# Patient Record
Sex: Male | Born: 1950
Health system: Southern US, Community
[De-identification: ages and names within clinical notes are randomized; demographics above are authoritative.]

## PROBLEM LIST (undated history)

## (undated) DIAGNOSIS — E119 Type 2 diabetes mellitus without complications: Secondary | ICD-10-CM

## (undated) DIAGNOSIS — R011 Cardiac murmur, unspecified: Secondary | ICD-10-CM

## (undated) DIAGNOSIS — K219 Gastro-esophageal reflux disease without esophagitis: Secondary | ICD-10-CM

## (undated) DIAGNOSIS — E785 Hyperlipidemia, unspecified: Secondary | ICD-10-CM

## (undated) DIAGNOSIS — M1990 Unspecified osteoarthritis, unspecified site: Secondary | ICD-10-CM

## (undated) DIAGNOSIS — I10 Essential (primary) hypertension: Secondary | ICD-10-CM

## (undated) DIAGNOSIS — R0600 Dyspnea, unspecified: Secondary | ICD-10-CM

## (undated) DIAGNOSIS — L409 Psoriasis, unspecified: Secondary | ICD-10-CM

## (undated) DIAGNOSIS — Z87442 Personal history of urinary calculi: Secondary | ICD-10-CM

## (undated) DIAGNOSIS — J189 Pneumonia, unspecified organism: Secondary | ICD-10-CM

## (undated) DIAGNOSIS — Z8639 Personal history of other endocrine, nutritional and metabolic disease: Secondary | ICD-10-CM

## (undated) DIAGNOSIS — Z9289 Personal history of other medical treatment: Secondary | ICD-10-CM

## (undated) HISTORY — PX: WRIST SURGERY: SHX841

## (undated) HISTORY — PX: DENTAL SURGERY: SHX609

## (undated) HISTORY — PX: COLONOSCOPY: SHX174

---

## 1993-12-01 HISTORY — PX: BACK SURGERY: SHX140

## 2000-10-12 ENCOUNTER — Encounter: Payer: Self-pay | Admitting: Neurosurgery

## 2000-10-12 ENCOUNTER — Ambulatory Visit (HOSPITAL_COMMUNITY): Admission: RE | Admit: 2000-10-12 | Discharge: 2000-10-12 | Payer: Self-pay | Admitting: Neurosurgery

## 2000-10-26 ENCOUNTER — Ambulatory Visit (HOSPITAL_COMMUNITY): Admission: RE | Admit: 2000-10-26 | Discharge: 2000-10-26 | Payer: Self-pay | Admitting: Neurosurgery

## 2000-10-26 ENCOUNTER — Encounter: Payer: Self-pay | Admitting: Neurosurgery

## 2000-11-09 ENCOUNTER — Encounter: Payer: Self-pay | Admitting: Neurosurgery

## 2000-11-09 ENCOUNTER — Ambulatory Visit (HOSPITAL_COMMUNITY): Admission: RE | Admit: 2000-11-09 | Discharge: 2000-11-09 | Payer: Self-pay | Admitting: Neurosurgery

## 2001-02-03 ENCOUNTER — Encounter: Payer: Self-pay | Admitting: Neurosurgery

## 2001-02-04 ENCOUNTER — Ambulatory Visit (HOSPITAL_COMMUNITY): Admission: RE | Admit: 2001-02-04 | Discharge: 2001-02-05 | Payer: Self-pay | Admitting: Neurosurgery

## 2001-02-04 ENCOUNTER — Encounter: Payer: Self-pay | Admitting: Neurosurgery

## 2002-09-02 ENCOUNTER — Emergency Department (HOSPITAL_COMMUNITY): Admission: EM | Admit: 2002-09-02 | Discharge: 2002-09-02 | Payer: Self-pay | Admitting: *Deleted

## 2002-09-02 ENCOUNTER — Encounter: Payer: Self-pay | Admitting: *Deleted

## 2005-07-28 ENCOUNTER — Ambulatory Visit: Payer: Self-pay | Admitting: Family Medicine

## 2005-10-15 ENCOUNTER — Ambulatory Visit: Payer: Self-pay | Admitting: Family Medicine

## 2006-02-18 ENCOUNTER — Ambulatory Visit: Payer: Self-pay | Admitting: Family Medicine

## 2006-05-18 ENCOUNTER — Ambulatory Visit: Payer: Self-pay | Admitting: Family Medicine

## 2007-01-06 ENCOUNTER — Ambulatory Visit: Payer: Self-pay | Admitting: Family Medicine

## 2011-10-06 ENCOUNTER — Encounter (HOSPITAL_COMMUNITY): Payer: Self-pay | Admitting: Pharmacy Technician

## 2011-10-06 ENCOUNTER — Encounter (HOSPITAL_COMMUNITY): Payer: Self-pay | Admitting: *Deleted

## 2011-10-06 NOTE — Pre-Procedure Instructions (Signed)
Instructed NPO after MN except may have clear liquids until 0530- then none.  STATE LEARNS BEST BUY  READING_  STATED HEIGHT 6'0 and WEIGHT 217 lbs. Instructed to use Betasept or Hibiclins shower tonight and in the AM, but regular soap on face and privates

## 2011-10-06 NOTE — H&P (Signed)
Keith Oconnell DOB: Sep 30, 1951 Married / Language: English / Race: White Male   History of Present Illness The patient is a 60 year old male who presents with shoulder complaints. They are right handed and present today reporting pain, pain with overhead motions, pain with throwing, pain with reaching and pain with lifting at the right shoulder that began 2 month(s) ago. The patient reports that the shoulder symptoms began following a specific injury. The injury resulted from arm was jerked that occurred at work. The patient reports symptoms which include shoulder pain, decreased range of motion and inability to lay on that side. The patient reports symptoms that radiate to the right upper arm. The patient reports that these symptoms are present constantly. The patient describes these symptoms as moderate in severity and worsening. Symptoms are exacerbated by lifting. The patient was previously evaluated in urgent care 2 week(s) ago. Presentation at that time included shoulder injury and shoulder pain. Past evaluation has included shoulder x-rays (but did not bring) and shoulder MRI (done at [facility], did not bring the disc). 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    Subjective Transcription  Keith Oconnell came in with his wife with a chief complaint of pain an inability to totally elevate his right arm from the side. His injury goes back as we discussed here, about 2 months ago.    Allergies No Known Drug Allergies.   Family History Chronic Obstructive Lung Disease. mother Diabetes Mellitus. mother and brother Hypertension. mother, father and sister Osteoarthritis. mother and father   Social History Alcohol use. never consumed alcohol Children. 2 Current work status. working full time Drug/Alcohol Rehab (Currently). no Drug/Alcohol Rehab (Previously). no Exercise. Exercises never Illicit drug use. no Living situation. live with  spouse Marital status. married Number of flights of stairs before winded. 4-5 Pain Contract. yes Tobacco / smoke exposure. no Tobacco use. never smoker   Medication History Simvastatin (20MG  Tablet, Oral) Active. Lovaza (1GM Capsule, Oral) Active. Ramipril (10MG  Capsule, Oral) Active. Antara (130MG  Capsule, Oral) Active.   Past Surgical History Colon Polyp Removal - Colonoscopy Other Orthopaedic Surgery Spinal Surgery   Other Problems High blood pressure Hypercholesterolemia Osteoarthritis   Review of Systems General:Not Present- Chills, Fever, Night Sweats, Appetite Loss, Fatigue, Feeling sick, Weight Gain and Weight Loss. Skin:Present- Psoriasis. Not Present- Itching, Rash, Skin Color Changes, Ulcer and Change in Hair or Nails. HEENT:Not Present- Sensitivity to light, Hearing problems, Nose Bleed and Ringing in the Ears. Neck:Not Present- Swollen Glands and Neck Mass. Respiratory:Not Present- Snoring, Chronic Cough, Bloody sputum and Dyspnea. Cardiovascular:Present- Leg Cramps. Not Present- Shortness of Breath, Chest Pain, Swelling of Extremities and Palpitations. Gastrointestinal:Present- Heartburn. Not Present- Bloody Stool, Abdominal Pain, Vomiting, Nausea and Incontinence of Stool. Musculoskeletal:Present- Muscle Weakness, Muscle Pain, Joint Stiffness, Joint Pain and Back Pain. Not Present- Joint Swelling. Neurological:Present- Tingling and Burning. Not Present- Numbness, Tremor, Headaches and Dizziness. Psychiatric:Not Present- Anxiety, Depression and Memory Loss. Endocrine:Not Present- Cold Intolerance, Heat Intolerance, Excessive hunger and Excessive Thirst. Hematology:Not Present- Abnormal Bleeding, Anemia, Blood Clots and Easy Bruising.   Vitals Weight: 221.13 lb Height: 71 in Body Surface Area: 2.24 m Body Mass Index: 30.84 kg/m Pulse: 50 (Regular) BP: 168/90 (Sitting, Left Arm, Standard)    Objective  Transcription  Physical exam today shows marked weakness of his abductors of his right shoulder. The biceps and triceps appear to be intact as well as we can examine today. He does appear to have some fibers still attached to the humeral head  because he can elevate his arm, but weakly so. He has no masses or tumors about the shoulder or the axillary region. He has good circulation in his hand, good hand function.    RADIOGRAPHS:  The x-rays of his shoulder, I thought, were negative.  I did go over the MRI scan. He has a complete retracted tear of the right rotator cuff.    Assessment & Plan Complete rotator cuff rupture, non-traumatic (727.61)   Plans Transcription  We need to get on with it now and get this repaired. It's been 2 months since his injury. We need to get an open acromionectomy and repair of the rotator cuff. We probably will need a graft material, tissue mend graft, and anchors. I explained to him and his wife what they are and what the graft is. We also discussed the fact that I cannot guarantee him that we're going to have a normal-appearing tendon there. He's been 2 months out now, and we're going to do our best, but he has no alternatives other than to have it fixed. He will be in a sling 4-6 weeks. We will do this and keep him overnight. Also we will need to get medical clearance because of his hypertension.   Keith Oconnell, M. D.

## 2011-10-07 ENCOUNTER — Other Ambulatory Visit: Payer: Self-pay

## 2011-10-07 ENCOUNTER — Ambulatory Visit (HOSPITAL_COMMUNITY): Payer: Worker's Compensation

## 2011-10-07 ENCOUNTER — Encounter (HOSPITAL_COMMUNITY): Payer: Self-pay | Admitting: *Deleted

## 2011-10-07 ENCOUNTER — Encounter (HOSPITAL_COMMUNITY): Payer: Self-pay | Admitting: Anesthesiology

## 2011-10-07 ENCOUNTER — Encounter (HOSPITAL_COMMUNITY)
Admission: RE | Disposition: A | Discharge status: Home / Self Care | Payer: Self-pay | Source: Ambulatory Visit | Attending: Orthopedic Surgery

## 2011-10-07 ENCOUNTER — Inpatient Hospital Stay (HOSPITAL_COMMUNITY)
Admission: RE | Admit: 2011-10-07 | Discharge: 2011-10-08 | DRG: 512 | Disposition: A | Discharge status: Home / Self Care | Payer: Worker's Compensation | Source: Ambulatory Visit | Attending: Orthopedic Surgery | Admitting: Orthopedic Surgery

## 2011-10-07 ENCOUNTER — Ambulatory Visit (HOSPITAL_COMMUNITY): Payer: Worker's Compensation | Admitting: Anesthesiology

## 2011-10-07 DIAGNOSIS — M75100 Unspecified rotator cuff tear or rupture of unspecified shoulder, not specified as traumatic: Secondary | ICD-10-CM | POA: Diagnosis present

## 2011-10-07 DIAGNOSIS — M25819 Other specified joint disorders, unspecified shoulder: Secondary | ICD-10-CM | POA: Diagnosis present

## 2011-10-07 DIAGNOSIS — X503XXA Overexertion from repetitive movements, initial encounter: Secondary | ICD-10-CM | POA: Diagnosis present

## 2011-10-07 DIAGNOSIS — M1990 Unspecified osteoarthritis, unspecified site: Secondary | ICD-10-CM | POA: Diagnosis present

## 2011-10-07 DIAGNOSIS — I10 Essential (primary) hypertension: Secondary | ICD-10-CM | POA: Diagnosis present

## 2011-10-07 DIAGNOSIS — S43429A Sprain of unspecified rotator cuff capsule, initial encounter: Principal | ICD-10-CM | POA: Diagnosis present

## 2011-10-07 DIAGNOSIS — E7800 Pure hypercholesterolemia, unspecified: Secondary | ICD-10-CM | POA: Diagnosis present

## 2011-10-07 HISTORY — DX: Unspecified osteoarthritis, unspecified site: M19.90

## 2011-10-07 HISTORY — DX: Essential (primary) hypertension: I10

## 2011-10-07 HISTORY — PX: SHOULDER OPEN ROTATOR CUFF REPAIR: SHX2407

## 2011-10-07 LAB — URINALYSIS, ROUTINE W REFLEX MICROSCOPIC
Bilirubin Urine: NEGATIVE
Hgb urine dipstick: NEGATIVE
Specific Gravity, Urine: 1.017 (ref 1.005–1.030)
Urobilinogen, UA: 1 mg/dL (ref 0.0–1.0)

## 2011-10-07 LAB — CBC
HCT: 39.7 % (ref 39.0–52.0)
Hemoglobin: 13.9 g/dL (ref 13.0–17.0)
WBC: 5.3 10*3/uL (ref 4.0–10.5)

## 2011-10-07 LAB — COMPREHENSIVE METABOLIC PANEL
AST: 27 U/L (ref 0–37)
BUN: 16 mg/dL (ref 6–23)
CO2: 24 mEq/L (ref 19–32)
Chloride: 107 mEq/L (ref 96–112)
Creatinine, Ser: 1.07 mg/dL (ref 0.50–1.35)
GFR calc non Af Amer: 74 mL/min — ABNORMAL LOW (ref >90)
Total Bilirubin: 0.4 mg/dL (ref 0.3–1.2)

## 2011-10-07 LAB — DIFFERENTIAL
Basophils Absolute: 0 10*3/uL (ref 0.0–0.1)
Lymphocytes Relative: 31 % (ref 12–46)
Lymphs Abs: 1.6 10*3/uL (ref 0.7–4.0)
Monocytes Absolute: 0.4 10*3/uL (ref 0.1–1.0)
Monocytes Relative: 8 % (ref 3–12)
Neutro Abs: 3.1 10*3/uL (ref 1.7–7.7)

## 2011-10-07 LAB — PROTIME-INR: INR: 1.04 (ref 0.00–1.49)

## 2011-10-07 LAB — SURGICAL PCR SCREEN: Staphylococcus aureus: POSITIVE — AB

## 2011-10-07 SURGERY — REPAIR, ROTATOR CUFF, OPEN
Anesthesia: General | Site: Shoulder | Laterality: Right | Wound class: Clean

## 2011-10-07 MED ORDER — HYDROMORPHONE 0.3 MG/ML IV SOLN
INTRAVENOUS | Status: DC
  Administered 2011-10-07: 21:00:00 via INTRAVENOUS
  Administered 2011-10-08: 3.39 mg via INTRAVENOUS
  Filled 2011-10-07: qty 25

## 2011-10-07 MED ORDER — METHOCARBAMOL 500 MG PO TABS
500.0000 mg | ORAL_TABLET | Freq: Four times a day (QID) | ORAL | Status: DC | PRN
  Administered 2011-10-08: 500 mg via ORAL
  Filled 2011-10-07: qty 1

## 2011-10-07 MED ORDER — SUCCINYLCHOLINE CHLORIDE 20 MG/ML IJ SOLN
INTRAMUSCULAR | Status: DC | PRN
  Administered 2011-10-07: 100 mg via INTRAVENOUS

## 2011-10-07 MED ORDER — ONDANSETRON HCL 4 MG/2ML IJ SOLN: 4.0000 mg | Freq: Four times a day (QID) | INTRAMUSCULAR | Status: DC | PRN

## 2011-10-07 MED ORDER — LACTATED RINGERS IV SOLN
INTRAVENOUS | Status: DC
  Administered 2011-10-07 – 2011-10-08 (×2): via INTRAVENOUS

## 2011-10-07 MED ORDER — LIDOCAINE HCL (CARDIAC) 20 MG/ML IV SOLN
INTRAVENOUS | Status: DC | PRN
  Administered 2011-10-07: 100 mg via INTRAVENOUS

## 2011-10-07 MED ORDER — HYDROMORPHONE HCL PF 1 MG/ML IJ SOLN: 0.2500 mg | INTRAMUSCULAR | Status: DC | PRN

## 2011-10-07 MED ORDER — BISACODYL 5 MG PO TBEC: 10.0000 mg | DELAYED_RELEASE_TABLET | Freq: Every day | ORAL | Status: DC | PRN

## 2011-10-07 MED ORDER — MENTHOL 3 MG MT LOZG: 1.0000 | LOZENGE | OROMUCOSAL | Status: DC | PRN

## 2011-10-07 MED ORDER — PROMETHAZINE HCL 25 MG/ML IJ SOLN: 6.2500 mg | INTRAMUSCULAR | Status: DC | PRN

## 2011-10-07 MED ORDER — CEFAZOLIN SODIUM 1-5 GM-% IV SOLN
1.0000 g | Freq: Four times a day (QID) | INTRAVENOUS | Status: AC
  Administered 2011-10-07 – 2011-10-08 (×2): 1 g via INTRAVENOUS
  Filled 2011-10-07 (×3): qty 50

## 2011-10-07 MED ORDER — THERA M PLUS PO TABS
1.0000 | ORAL_TABLET | Freq: Every day | ORAL | Status: DC
  Administered 2011-10-07: 1 via ORAL
  Filled 2011-10-07 (×2): qty 1

## 2011-10-07 MED ORDER — DIPHENHYDRAMINE HCL 50 MG/ML IJ SOLN: 12.5000 mg | Freq: Four times a day (QID) | INTRAMUSCULAR | Status: DC | PRN

## 2011-10-07 MED ORDER — POLYETHYLENE GLYCOL 3350 17 G PO PACK
17.0000 g | PACK | Freq: Every day | ORAL | Status: DC | PRN
  Filled 2011-10-07: qty 1

## 2011-10-07 MED ORDER — RAMIPRIL 10 MG PO CAPS
10.0000 mg | ORAL_CAPSULE | Freq: Every day | ORAL | Status: DC
  Filled 2011-10-07: qty 1

## 2011-10-07 MED ORDER — CEFAZOLIN SODIUM 1-5 GM-% IV SOLN: 1.0000 g | INTRAVENOUS | Status: DC

## 2011-10-07 MED ORDER — ACETAMINOPHEN 10 MG/ML IV SOLN
INTRAVENOUS | Status: DC | PRN
  Administered 2011-10-07: 1000 mg via INTRAVENOUS

## 2011-10-07 MED ORDER — ACETAMINOPHEN 650 MG RE SUPP: 650.0000 mg | Freq: Four times a day (QID) | RECTAL | Status: DC | PRN

## 2011-10-07 MED ORDER — MIDAZOLAM HCL 5 MG/5ML IJ SOLN
INTRAMUSCULAR | Status: DC | PRN
  Administered 2011-10-07: 2 mg via INTRAVENOUS

## 2011-10-07 MED ORDER — DOCUSATE SODIUM 100 MG PO CAPS
100.0000 mg | ORAL_CAPSULE | Freq: Two times a day (BID) | ORAL | Status: DC
  Administered 2011-10-07: 100 mg via ORAL
  Filled 2011-10-07 (×3): qty 1

## 2011-10-07 MED ORDER — BISACODYL 10 MG RE SUPP: 10.0000 mg | Freq: Every day | RECTAL | Status: DC | PRN

## 2011-10-07 MED ORDER — PHENOL 1.4 % MT LIQD: 1.0000 | OROMUCOSAL | Status: DC | PRN

## 2011-10-07 MED ORDER — OMEGA-3-ACID ETHYL ESTERS 1 G PO CAPS
1.0000 g | ORAL_CAPSULE | Freq: Every day | ORAL | Status: DC
  Filled 2011-10-07: qty 1

## 2011-10-07 MED ORDER — NALOXONE HCL 0.4 MG/ML IJ SOLN: 0.4000 mg | INTRAMUSCULAR | Status: DC | PRN

## 2011-10-07 MED ORDER — ALUMINUM HYDROXIDE GEL 600 MG/5ML PO SUSP: 15.0000 mL | ORAL | Status: DC | PRN

## 2011-10-07 MED ORDER — LACTATED RINGERS IV SOLN
INTRAVENOUS | Status: DC
  Administered 2011-10-07: 1000 mL via INTRAVENOUS

## 2011-10-07 MED ORDER — ASPIRIN EC 325 MG PO TBEC
325.0000 mg | DELAYED_RELEASE_TABLET | Freq: Every day | ORAL | Status: DC
  Administered 2011-10-07: 325 mg via ORAL
  Filled 2011-10-07 (×2): qty 1

## 2011-10-07 MED ORDER — ROPIVACAINE HCL 5 MG/ML IJ SOLN
INTRAMUSCULAR | Status: DC | PRN
  Administered 2011-10-07: 30 mL

## 2011-10-07 MED ORDER — LACTATED RINGERS IV SOLN
INTRAVENOUS | Status: DC | PRN
  Administered 2011-10-07 (×2): via INTRAVENOUS

## 2011-10-07 MED ORDER — THROMBIN 20000 UNITS EX KIT
PACK | CUTANEOUS | Status: DC | PRN
  Administered 2011-10-07: 17:00:00 via TOPICAL

## 2011-10-07 MED ORDER — FLEET ENEMA 7-19 GM/118ML RE ENEM: 1.0000 | ENEMA | Freq: Every day | RECTAL | Status: DC | PRN

## 2011-10-07 MED ORDER — DIPHENHYDRAMINE HCL 12.5 MG/5ML PO ELIX: 12.5000 mg | ORAL_SOLUTION | Freq: Four times a day (QID) | ORAL | Status: DC | PRN

## 2011-10-07 MED ORDER — SODIUM CHLORIDE 0.9 % IR SOLN
Status: DC | PRN
  Administered 2011-10-07: 16:00:00

## 2011-10-07 MED ORDER — OMEGA-3-ACID ETHYL ESTERS 1 G PO CAPS
2.0000 g | ORAL_CAPSULE | Freq: Every day | ORAL | Status: DC
  Filled 2011-10-07 (×2): qty 2

## 2011-10-07 MED ORDER — CEFAZOLIN SODIUM 1-5 GM-% IV SOLN
INTRAVENOUS | Status: DC | PRN
  Administered 2011-10-07: 2 g via INTRAVENOUS

## 2011-10-07 MED ORDER — OMEGA-3-ACID ETHYL ESTERS 1 G PO CAPS: 1.0000 g | ORAL_CAPSULE | Freq: Two times a day (BID) | ORAL | Status: DC

## 2011-10-07 MED ORDER — FENTANYL CITRATE 0.05 MG/ML IJ SOLN
INTRAMUSCULAR | Status: DC | PRN
  Administered 2011-10-07 (×2): 50 ug via INTRAVENOUS

## 2011-10-07 MED ORDER — PROPOFOL 10 MG/ML IV EMUL
INTRAVENOUS | Status: DC | PRN
  Administered 2011-10-07: 200 mg via INTRAVENOUS

## 2011-10-07 MED ORDER — METHOCARBAMOL 100 MG/ML IJ SOLN
500.0000 mg | Freq: Four times a day (QID) | INTRAMUSCULAR | Status: DC | PRN
  Filled 2011-10-07: qty 5

## 2011-10-07 MED ORDER — SODIUM CHLORIDE 0.9 % IJ SOLN: 9.0000 mL | INTRAMUSCULAR | Status: DC | PRN

## 2011-10-07 MED ORDER — EPHEDRINE SULFATE 50 MG/ML IJ SOLN
INTRAMUSCULAR | Status: DC | PRN
  Administered 2011-10-07 (×2): 5 mg via INTRAVENOUS

## 2011-10-07 MED ORDER — MAGNESIUM HYDROXIDE 400 MG/5ML PO SUSP: 30.0000 mL | Freq: Two times a day (BID) | ORAL | Status: DC | PRN

## 2011-10-07 MED ORDER — BACITRACIN-NEOMYCIN-POLYMYXIN 400-5-5000 EX OINT
TOPICAL_OINTMENT | CUTANEOUS | Status: DC | PRN
  Administered 2011-10-07: 1 via TOPICAL

## 2011-10-07 MED ORDER — ONDANSETRON HCL 4 MG PO TABS
4.0000 mg | ORAL_TABLET | Freq: Four times a day (QID) | ORAL | Status: DC | PRN
  Administered 2011-10-08: 4 mg via ORAL
  Filled 2011-10-07: qty 1

## 2011-10-07 MED ORDER — ACETAMINOPHEN 325 MG PO TABS: 650.0000 mg | ORAL_TABLET | Freq: Four times a day (QID) | ORAL | Status: DC | PRN

## 2011-10-07 MED ORDER — FENOFIBRATE 160 MG PO TABS
160.0000 mg | ORAL_TABLET | Freq: Every day | ORAL | Status: DC
  Administered 2011-10-07: 160 mg via ORAL
  Filled 2011-10-07 (×2): qty 1

## 2011-10-07 SURGICAL SUPPLY — 46 items
ANCH SUT 2 5.5 BABSR ASCP (Orthopedic Implant) ×2 IMPLANT
ANCHOR PEEK ZIP 5.5 NDL NO2 (Orthopedic Implant) ×2 IMPLANT
BAG SPEC THK2 15X12 ZIP CLS (MISCELLANEOUS) ×1
BAG ZIPLOCK 12X15 (MISCELLANEOUS) ×2 IMPLANT
BLADE OSCILLATING/SAGITTAL (BLADE) ×2
BLADE SW THK.38XMED LNG THN (BLADE) ×1 IMPLANT
BNDG COHESIVE 6X5 TAN NS LF (GAUZE/BANDAGES/DRESSINGS) IMPLANT
BUR OVAL CARBIDE 4.0 (BURR) ×2 IMPLANT
CLEANER TIP ELECTROSURG 2X2 (MISCELLANEOUS) ×2 IMPLANT
CLOTH BEACON ORANGE TIMEOUT ST (SAFETY) ×2 IMPLANT
DRAPE POUCH INSTRU U-SHP 10X18 (DRAPES) ×2 IMPLANT
DRSG ADAPTIC 3X8 NADH LF (GAUZE/BANDAGES/DRESSINGS) ×1 IMPLANT
DRSG EMULSION OIL 3X3 NADH (GAUZE/BANDAGES/DRESSINGS) ×2 IMPLANT
DRSG PAD ABDOMINAL 8X10 ST (GAUZE/BANDAGES/DRESSINGS) ×3 IMPLANT
DURAPREP 26ML APPLICATOR (WOUND CARE) ×2 IMPLANT
ELECT REM PT RETURN 9FT ADLT (ELECTROSURGICAL) ×2
ELECTRODE REM PT RTRN 9FT ADLT (ELECTROSURGICAL) ×1 IMPLANT
GLOVE BIOGEL PI IND STRL 8.5 (GLOVE) ×1 IMPLANT
GLOVE BIOGEL PI INDICATOR 8.5 (GLOVE) ×1
GLOVE ECLIPSE 8.0 STRL XLNG CF (GLOVE) ×2 IMPLANT
GOWN PREVENTION PLUS LG XLONG (DISPOSABLE) ×4 IMPLANT
GOWN STRL REIN XL XLG (GOWN DISPOSABLE) ×4 IMPLANT
KIT BASIN OR (CUSTOM PROCEDURE TRAY) ×2 IMPLANT
MANIFOLD NEPTUNE II (INSTRUMENTS) ×2 IMPLANT
NDL MA TROC 1/2 (NEEDLE) IMPLANT
NEEDLE MA TROC 1/2 (NEEDLE) IMPLANT
NS IRRIG 1000ML POUR BTL (IV SOLUTION) IMPLANT
PACK SHOULDER CUSTOM OPM052 (CUSTOM PROCEDURE TRAY) ×2 IMPLANT
PASSER SUT SWANSON 36MM LOOP (INSTRUMENTS) IMPLANT
PATCH TISSUE MEND 3X3CM (Orthopedic Implant) ×1 IMPLANT
POSITIONER SURGICAL ARM (MISCELLANEOUS) ×2 IMPLANT
SLING ULTRA II L (ORTHOPEDIC SUPPLIES) ×1 IMPLANT
SPONGE GAUZE 4X4 FOR O.R. (GAUZE/BANDAGES/DRESSINGS) ×1 IMPLANT
SPONGE SURGIFOAM ABS GEL 100 (HEMOSTASIS) IMPLANT
STAPLER VISISTAT 35W (STAPLE) ×2 IMPLANT
SUCTION FRAZIER 12FR DISP (SUCTIONS) ×2 IMPLANT
SUT BONE WAX W31G (SUTURE) ×2 IMPLANT
SUT ETHIBOND NAB CT1 #1 30IN (SUTURE) IMPLANT
SUT VIC AB 0 CT1 27 (SUTURE) ×2
SUT VIC AB 0 CT1 27XBRD ANTBC (SUTURE) ×1 IMPLANT
SUT VIC AB 1 CT1 27 (SUTURE) ×4
SUT VIC AB 1 CT1 27XBRD ANTBC (SUTURE) ×2 IMPLANT
SUT VIC AB 2-0 CT1 27 (SUTURE)
SUT VIC AB 2-0 CT1 27XBRD (SUTURE) IMPLANT
TAPE HYPAFIX 6X30 (GAUZE/BANDAGES/DRESSINGS) ×1 IMPLANT
TOWEL OR 17X26 10 PK STRL BLUE (TOWEL DISPOSABLE) ×4 IMPLANT

## 2011-10-07 NOTE — Op Note (Signed)
NAMEMarland Kitchen  CECIL, VANDYKE               ACCOUNT NO.:  [id number]  MEDICAL RECORD NO.:  [id number]  LOCATION:  1609                         FACILITY:  [facility]  PHYSICIAN:  Georges Lynch. Tadhg Eskew, M.D.DATE OF BIRTH:  07-25-1951  DATE OF PROCEDURE:  10/07/2011 DATE OF DISCHARGE:                              OPERATIVE REPORT   SURGEON:  Georges Lynch. Darrelyn Hillock, MD  ASSISTANT:  Nurse.  PREOPERATIVE DIAGNOSES: 1. Complete complex retracted tear of the right rotator cuff tendon. 2. Severe impingement syndrome, right shoulder.  POSTOPERATIVE DIAGNOSES: 1. Complete complex retracted tear of the right rotator cuff tendon. 2. Severe impingement syndrome, right shoulder.  OPERATION: 1. Open acromionectomy, right shoulder. 2. Repair of a right complex tear of the right rotator cuff which was     retracted. 3. TissueMend graft with 3 Stryker anchors, left shoulder repair.  PROCEDURE IN DETAIL:  Under general anesthesia, routine orthopedic prepping and draping of the right lower extremity was carried out.  He had 2 g of IV Ancef.  At this time, the appropriate time-out was carried out in the operating room.  Prior to taking him to the operating room, in the holding area, I marked the appropriate right arm.  At this time, an incision was made over the anterior aspect of the right acromion and right deltoid muscle.  Bleeders identified and cauterized.  Self- retaining retractors were inserted.  I then by sharp dissection separated deltoid tendon from the acromion.  I then incised the proximal part of the deltoid, then I inserted the retractors and identified the subdeltoid bursa.  He had severe impingement syndrome secondary to overgrowth of the acromion.  I protected the underlying cuff, inserted a Bennett retractor, and then utilized the oscillating saw to do a partial acromionectomy and then utilized a bur to even out the undersurface of the acromion.  Once we re-established the subacromial space, I  then excised the subdeltoid bursa, identified the retracted tear of the rotator cuff.  I utilized a burr to burr down the lateral aspect of the humeral head.  Three anchors were placed in the proximal humerus with 4 sutures each.  I then thoroughly irrigated out the area.  I bone waxed the undersurface of the acromion.  I then grasped the rotator cuff with the Kocher clamp, brought it forward.  I then sutured the cuff down and placed in the usual fashion.  I saved 2 of the sutures for application of the TissueMend graft.  I utilized a 3 x 3 TissueMend graft to reinforce the repair.  This was sutured down in place with Ethibond suture.  I thoroughly irrigated out the area.  I inserted small pieces of thrombin-soaked Gelfoam for hemostasis purposes.  I then reapproximated the deltoid tendon muscle in usual fashion.  Subcutaneous was closed with 0 Vicryl.  The skin was closed with metal staples. Sterile Neosporin dressing was applied.  He was placed in a shoulder immobilizer with a pillow for support with an abduction pillow.  Prior to surgery in the holding area, he had an interscalene nerve block.  He will be admitted for overnight extended care.          ______________________________ Georges Lynch.  Darrelyn Hillock, M.D.     RAG/MEDQ  D:  10/07/2011  T:  10/07/2011  Job:  [id number]

## 2011-10-07 NOTE — Progress Notes (Signed)
Pt's right shoulder  Prep done as ordered pre-op.

## 2011-10-07 NOTE — Anesthesia Preprocedure Evaluation (Signed)
Anesthesia Evaluation  Patient identified by MRN, date of birth, ID band Patient awake    Reviewed: Allergy & Precautions, H&P , NPO status , Patient's Chart, lab work & pertinent test results  Airway Mallampati: II TM Distance: >3 FB     Dental  (+) Teeth Intact,    Pulmonary neg pulmonary ROS,  clear to auscultation  Pulmonary exam normal       Cardiovascular Exercise Tolerance: Good hypertension,     Neuro/Psych Negative Neurological ROS  Negative Psych ROS   GI/Hepatic negative GI ROS, Neg liver ROS,   Endo/Other  Negative Endocrine ROS  Renal/GU negative Renal ROS  Genitourinary negative   Musculoskeletal negative musculoskeletal ROS (+)   Abdominal Normal abdominal exam  (+)   Peds negative pediatric ROS (+)  Hematology negative hematology ROS (+)   Anesthesia Other Findings   Reproductive/Obstetrics negative OB ROS                           Anesthesia Physical Anesthesia Plan  ASA: II  Anesthesia Plan: General   Post-op Pain Management:    Induction: Intravenous  Airway Management Planned: Oral ETT  Additional Equipment:   Intra-op Plan:   Post-operative Plan: Extubation in OR  Informed Consent: I have reviewed the patients History and Physical, chart, labs and discussed the procedure including the risks, benefits and alternatives for the proposed anesthesia with the patient or authorized representative who has indicated his/her understanding and acceptance.   Dental advisory given  Plan Discussed with: CRNA and Surgeon  Anesthesia Plan Comments:         Anesthesia Quick Evaluation

## 2011-10-07 NOTE — Brief Op Note (Signed)
10/07/2011  4:53 PM  PATIENT:  Keith Oconnell  60 y.o. male  PRE-OPERATIVE DIAGNOSIS:  Right Shoulder Rotator Cuff Tear  POST-OPERATIVE DIAGNOSIS:  torn rotater cuff right  PROCEDURE:  Procedure(s): ROTATOR CUFF REPAIR SHOULDER OPEN  SURGEON:  Surgeon(s): Maygen Sirico A Brindley Madarang  PHYSICIAN ASSISTANT:   ASSISTANTS: Nurse   ANESTHESIA:   general  EBL:  Total I/O In: 1700 [I.V.:1700] Out: -   BLOOD ADMINISTERED:none  DRAINS: none   LOCAL MEDICATIONS USED:  NONE  SPECIMEN:  No Specimen  DISPOSITION OF SPECIMEN:  N/A  COUNTS:  YES  TOURNIQUET:  * No tourniquets in log *  DICTATION: .Other Dictation: Dictation Number   PLAN OF CARE: Admit for overnight observation  PATIENT DISPOSITION:  PACU - hemodynamically stable.   Delay start of Pharmacological VTE agent (>24hrs) due to surgical blood loss or risk of bleeding:  not applicable,Did use PAS Hose

## 2011-10-07 NOTE — Progress Notes (Signed)
Pt to radiology for chest xray pre-op

## 2011-10-07 NOTE — Interval H&P Note (Signed)
History and Physical Interval Note:   10/07/2011   3:11 PM   [name]  has presented today for surgery, with the diagnosis of Right Shoulder Rotator Cuff Tear  The various methods of treatment have been discussed with the patient and family. After consideration of risks, benefits and other options for treatment, the patient has consented to  Procedure(s): ROTATOR CUFF REPAIR SHOULDER OPEN as a surgical intervention .  The patients' history has been reviewed, patient examined, no change in status, stable for surgery.  I have reviewed the patients' chart and labs.  Questions were answered to the patient's satisfaction.     Jacki Cones  MD

## 2011-10-07 NOTE — Anesthesia Postprocedure Evaluation (Signed)
  Anesthesia Post-op Note  Patient: Keith Oconnell  Procedure(s) Performed:  ROTATOR CUFF REPAIR SHOULDER OPEN - Open Rotator Cuff Repair/Acrominectomy with Graft and Anchors  Patient Location: PACU  Anesthesia Type: GA combined with regional for post-op pain  Level of Consciousness: awake, alert  and oriented  Airway and Oxygen Therapy: Patient Spontanous Breathing and Patient connected to nasal cannula oxygen  Post-op Pain: none  Post-op Assessment: Post-op Vital signs reviewed  Post-op Vital Signs: Reviewed  Complications: No apparent anesthesia complications

## 2011-10-07 NOTE — Transfer of Care (Signed)
Immediate Anesthesia Transfer of Care Note  Patient: Keith Oconnell  Procedure(s) Performed:  Mora Appl CUFF REPAIR SHOULDER OPEN - Open Rotator Cuff Repair/Acrominectomy with Graft and Anchors  Patient Location: PACU  Anesthesia Type: General  Level of Consciousness: awake and sedated  Airway & Oxygen Therapy: Patient Spontanous Breathing and Patient connected to face mask oxygen  Post-op Assessment: Report given to PACU RN, Post -op Vital signs reviewed and stable and Patient moving all extremities  Post vital signs: Reviewed and stable  Complications: No apparent anesthesia complications

## 2011-10-07 NOTE — Anesthesia Procedure Notes (Addendum)
Regional Block:  Interscalene brachial plexus block  Pre-Anesthetic Checklist: ,, timeout performed, Correct Patient, Correct Site, Correct Laterality, Correct Procedure, Correct Position, site marked, Risks and benefits discussed,  Surgical consent,  Pre-op evaluation,  At surgeon's request and post-op pain management  Laterality: Right  Prep: chloraprep       Needles:  Injection technique: Single-shot  Needle Type: Stimiplex          Additional Needles:  Procedures: ultrasound guided Interscalene brachial plexus block Narrative:  Start time: 10/07/2011 2:57 PM End time: 10/07/2011 3:08 PM  Performed by: Personally  Anesthesiologist: Mervyn Skeeters Fortune MD  Additional Notes: Patient tolerated the procedure well without complications

## 2011-10-08 DIAGNOSIS — M75100 Unspecified rotator cuff tear or rupture of unspecified shoulder, not specified as traumatic: Secondary | ICD-10-CM | POA: Diagnosis present

## 2011-10-08 MED ORDER — ALUM & MAG HYDROXIDE-SIMETH 200-200-20 MG/5ML PO SUSP
ORAL | Status: AC
  Administered 2011-10-08: 30 mL via ORAL
  Filled 2011-10-08: qty 30

## 2011-10-08 MED ORDER — OXYCODONE-ACETAMINOPHEN 5-325 MG PO TABS
ORAL_TABLET | ORAL | Status: AC
  Administered 2011-10-08: 1 via ORAL
  Filled 2011-10-08: qty 1

## 2011-10-08 MED ORDER — OXYCODONE-ACETAMINOPHEN 10-325 MG PO TABS: 1.0000 | ORAL_TABLET | ORAL | Status: AC | PRN

## 2011-10-08 MED ORDER — ROBAXIN 500 MG PO TABS: 500.0000 mg | ORAL_TABLET | Freq: Four times a day (QID) | ORAL | Status: AC

## 2011-10-08 MED ORDER — OXYCODONE-ACETAMINOPHEN 5-325 MG PO TABS
1.0000 | ORAL_TABLET | ORAL | Status: DC | PRN
  Administered 2011-10-08: 1 via ORAL

## 2011-10-08 MED ORDER — BIOTENE DRY MOUTH MT LIQD: 15.0000 mL | Freq: Two times a day (BID) | OROMUCOSAL | Status: DC

## 2011-10-08 NOTE — Progress Notes (Signed)
Paper eval completed secondary to OT shoulder eval not yet in EPIC. Paper eval placed in pt's paper chart. This was an eval, education, and D/C patient.

## 2011-10-08 NOTE — Plan of Care (Signed)
Problem: Phase II Progression Outcomes Goal: Other Discharge Outcomes/Goals Discharge to with wife

## 2011-10-08 NOTE — Progress Notes (Signed)
Subjective:    Objective: Vital signs in last 24 hours: Temp:  [96.8 F (36 C)-98.1 F (36.7 C)] 97.7 F (36.5 C) (11/07 1110) Pulse Rate:  [54-79] 66  (11/07 1110) Resp:  [16-18] 16  (11/07 1110) BP: (137-203)/(73-84) 189/76 mmHg (11/07 1110) SpO2:  [95 %-100 %] 95 % (11/07 1110) Weight:  [98.884 kg (218 lb)] 218 lb (98.884 kg) (11/07 0556)  Intake/Output from previous day: 11/06 0701 - 11/07 0700 In: 3045 [P.O.:300; I.V.:2645; IV Piggyback:100] Out: 1920 [Urine:1900; Blood:20] Intake/Output this shift:     Basename 10/07/11 1140  HGB 13.9    Basename 10/07/11 1140  WBC 5.3  RBC 4.45  HCT 39.7  PLT 214    Basename 10/07/11 1140  NA 141  K 3.7  CL 107  CO2 24  BUN 16  CREATININE 1.07  GLUCOSE 103*  CALCIUM 9.9    Basename 10/07/11 1140  LABPT --  INR 1.04    Incision: dressing C/D/I  Assessment/Plan: DC today and come to office as instructed   Jinger Middlesworth A 10/08/2011, 11:20 AM

## 2011-10-08 NOTE — Discharge Summary (Signed)
  Physician Discharge Summary  Patient ID: Keith Oconnell MRN: [id number] DOB/AGE: Oct 11, 1951 60 y.o.  Admit date: 10/07/2011  Discharge date: 10/08/2011  Admission Diagnoses: Right Shoulder Rotator Cuff Tear  Discharge Diagnoses: Same  Discharged Condition: stable  Hospital Course:  I reviewed vitals, labs, x-rays, and performed physical exam daily.  There were no complicating factors post-operatively.     Consults:   Significant Diagnostic Studies:   Operative Procedure:  Procedure(s): ROTATOR CUFF REPAIR SHOULDER OPEN  Date of Surgical Procedure: Nov.6,2012  Disposition:    Current Discharge Medication List    START taking these medications   Details  oxyCODONE-acetaminophen (PERCOCET) 10-325 MG per tablet Take 1 tablet by mouth every 4 (four) hours as needed for pain. Qty: 30 tablet, Refills: 0    ROBAXIN 500 MG tablet Take 1 tablet (500 mg total) by mouth 4 (four) times daily. Qty: 40 tablet, Refills: 1      CONTINUE these medications which have NOT CHANGED   Details  fenofibrate micronized (ANTARA) 130 MG capsule Take 130 mg by mouth daily before breakfast.     halobetasol (ULTRAVATE) 0.05 % cream Apply 1 application topically 2 (two) times daily as needed. Psoriasis    ramipril (ALTACE) 10 MG capsule Take 10 mg by mouth every morning.     aspirin EC 325 MG tablet Take 325 mg by mouth daily.     Garlic 1000 MG CAPS Take 1 capsule by mouth daily.     Multiple Vitamins-Minerals (MULTIVITAMINS THER. W/MINERALS) TABS Take 1 tablet by mouth daily.     omega-3 acid ethyl esters (LOVAZA) 1 G capsule Take 1-2 g by mouth 2 (two) times daily. 1 CAPSULES IN THE MORNING AND 2 CAPSULES IN THE EVENING      STOP taking these medications     HYDROcodone-acetaminophen (VICODIN) 5-500 MG per tablet          Discharge Instructions: See Dr. Darrelyn Hillock in 2-weeks  Signed: Khadeeja Elden A 10/08/2011, 11:25 AM

## 2011-10-12 ENCOUNTER — Encounter (HOSPITAL_COMMUNITY): Payer: Self-pay | Admitting: Orthopedic Surgery

## 2012-12-28 ENCOUNTER — Encounter: Payer: Self-pay | Admitting: Gastroenterology

## 2013-04-07 ENCOUNTER — Encounter (HOSPITAL_COMMUNITY): Payer: Self-pay | Admitting: Nurse Practitioner

## 2013-04-07 ENCOUNTER — Other Ambulatory Visit: Payer: Self-pay

## 2013-04-07 ENCOUNTER — Emergency Department (HOSPITAL_COMMUNITY)
Admission: EM | Admit: 2013-04-07 | Discharge: 2013-04-07 | Disposition: A | Discharge status: Home / Self Care | Payer: Worker's Compensation | Attending: Emergency Medicine | Admitting: Emergency Medicine

## 2013-04-07 ENCOUNTER — Emergency Department (HOSPITAL_COMMUNITY): Payer: Worker's Compensation

## 2013-04-07 DIAGNOSIS — S20219A Contusion of unspecified front wall of thorax, initial encounter: Secondary | ICD-10-CM | POA: Insufficient documentation

## 2013-04-07 DIAGNOSIS — M79609 Pain in unspecified limb: Secondary | ICD-10-CM | POA: Insufficient documentation

## 2013-04-07 DIAGNOSIS — I10 Essential (primary) hypertension: Secondary | ICD-10-CM | POA: Insufficient documentation

## 2013-04-07 DIAGNOSIS — Z79899 Other long term (current) drug therapy: Secondary | ICD-10-CM | POA: Insufficient documentation

## 2013-04-07 DIAGNOSIS — S20212D Contusion of left front wall of thorax, subsequent encounter: Secondary | ICD-10-CM

## 2013-04-07 DIAGNOSIS — M129 Arthropathy, unspecified: Secondary | ICD-10-CM | POA: Insufficient documentation

## 2013-04-07 DIAGNOSIS — Y929 Unspecified place or not applicable: Secondary | ICD-10-CM | POA: Insufficient documentation

## 2013-04-07 DIAGNOSIS — Z7982 Long term (current) use of aspirin: Secondary | ICD-10-CM | POA: Insufficient documentation

## 2013-04-07 DIAGNOSIS — W1809XA Striking against other object with subsequent fall, initial encounter: Secondary | ICD-10-CM | POA: Insufficient documentation

## 2013-04-07 DIAGNOSIS — Y9389 Activity, other specified: Secondary | ICD-10-CM | POA: Insufficient documentation

## 2013-04-07 LAB — BASIC METABOLIC PANEL
BUN: 20 mg/dL (ref 6–23)
CO2: 25 mEq/L (ref 19–32)
Calcium: 9.9 mg/dL (ref 8.4–10.5)
GFR calc non Af Amer: 60 mL/min — ABNORMAL LOW (ref >90)
Glucose, Bld: 117 mg/dL — ABNORMAL HIGH (ref 70–99)
Sodium: 137 mEq/L (ref 135–145)

## 2013-04-07 LAB — CBC
Hemoglobin: 13.6 g/dL (ref 13.0–17.0)
MCH: 31.6 pg (ref 26.0–34.0)
MCHC: 36.3 g/dL — ABNORMAL HIGH (ref 30.0–36.0)
MCV: 87.2 fL (ref 78.0–100.0)
Platelets: 241 10*3/uL (ref 150–400)
RBC: 4.3 MIL/uL (ref 4.22–5.81)

## 2013-04-07 LAB — POCT I-STAT, CHEM 8
BUN: 21 mg/dL (ref 6–23)
Calcium, Ion: 1.31 mmol/L — ABNORMAL HIGH (ref 1.13–1.30)
Chloride: 105 mEq/L (ref 96–112)
Creatinine, Ser: 1.2 mg/dL (ref 0.50–1.35)
TCO2: 29 mmol/L (ref 0–100)

## 2013-04-07 LAB — URINALYSIS, ROUTINE W REFLEX MICROSCOPIC
Glucose, UA: NEGATIVE mg/dL
Leukocytes, UA: NEGATIVE
Nitrite: NEGATIVE
Specific Gravity, Urine: 1.013 (ref 1.005–1.030)
pH: 7 (ref 5.0–8.0)

## 2013-04-07 LAB — SAMPLE TO BLOOD BANK

## 2013-04-07 LAB — PROTIME-INR
INR: 0.99 (ref 0.00–1.49)
Prothrombin Time: 13 seconds (ref 11.6–15.2)

## 2013-04-07 LAB — APTT: aPTT: 29 seconds (ref 24–37)

## 2013-04-07 LAB — POCT I-STAT TROPONIN I

## 2013-04-07 MED ORDER — IOHEXOL 350 MG/ML SOLN
100.0000 mL | Freq: Once | INTRAVENOUS | Status: AC | PRN
  Administered 2013-04-07: 100 mL via INTRAVENOUS

## 2013-04-07 NOTE — ED Notes (Signed)
Pt fell on Monday and was evaluated at [facility], states he had multiple xrays that day that were all normal. States he has felt sore since, but when he woke today he was having severe sharp CP, especially with inspiration. Denies SOB, nausea, diaphoresis. A&Ox4, resp e/u

## 2013-04-07 NOTE — ED Notes (Signed)
Pt asked for urine sample. Given cup and helped to the restroom, pt able to ambulated on own, sts "its just painful to move".

## 2013-04-07 NOTE — ED Notes (Signed)
Pt returned from radiology.

## 2013-04-07 NOTE — ED Notes (Signed)
Pt c/o left sharp CP. Pt reports he fell on Monday while at work and landed on his left side. Pt sts afterwards he was sore in his chest and entire left side. This morning he woke up this morning with sharper CP and worsening with a deep breath. CP hurts worse with palpation and movement. Pt in nad, skin warm and dry, resp e/u.

## 2013-04-07 NOTE — ED Provider Notes (Signed)
History     CSN: [id number]  Arrival date & time 04/07/13  1148   First MD Initiated Contact with Patient 04/07/13 1224      Chief Complaint  Patient presents with  . Chest Pain    (Consider location/radiation/quality/duration/timing/severity/associated sxs/prior treatment) HPI Comments: The patient is a 62 year old man who says he was working on a machine about 3 feet off the ground. [place] on a ranch, the rent slipped, and he fell face down onto the ground below. He had a microphone in his left chest pocket and a flashlight in his left leg pocket. He landed right on these 2 items. He was seen at [facility] urgent care [facility] where x-rays were taken or reportedly were all negative. He was prescribed Percocet for pain. Today his pain was much worse when he moves around. He his wife therefore brought him to [facility] to have him checked for his heart, and to see if there were any 'blood clots."  Patient is a 62 y.o. male presenting with chest pain. The history is provided by the patient and the spouse.  Chest Pain Pain location:  L chest Pain quality: sharp   Pain radiates to:  Does not radiate Pain severity:  Moderate (Pain rated at a 5 by pt.) Onset quality:  Sudden Duration: Followed an injury sustained in a fall 3 days ago. Timing:  Intermittent Progression:  Worsening Chronicity:  New Context: breathing, movement and trauma   Relieved by: Some relief with Percocet. Worsened by:  Coughing, deep breathing and movement Ineffective treatments:  None tried   Past Medical History  Diagnosis Date  . Hypertension   . Arthritis     Past Surgical History  Procedure Laterality Date  . Back surgery    . Dental surgery      pins placed upper front tooth  . Shoulder open rotator cuff repair  10/07/2011    Procedure: ROTATOR CUFF REPAIR SHOULDER OPEN;  Surgeon: Jacki Cones;  Location: [facility];  Service: Orthopedics;  Laterality: Right;  Open Rotator Cuff  Repair/Acrominectomy with Graft and Anchors    History reviewed. No pertinent family history.  History  Substance Use Topics  . Smoking status: Never Smoker   . Smokeless tobacco: Never Used  . Alcohol Use: No      Review of Systems  Constitutional: Negative.   HENT: Negative.   Eyes: Negative.   Respiratory: Negative.   Cardiovascular: Positive for chest pain.  Gastrointestinal: Negative.   Genitourinary: Negative.   Musculoskeletal:       Pain in left thigh.  Skin: Negative.   Neurological: Negative.   Psychiatric/Behavioral: Negative.     Allergies  Morphine and related  Home Medications   Current Outpatient Rx  Name  Route  Sig  Dispense  Refill  . aspirin EC 325 MG tablet   Oral   Take 325 mg by mouth daily.          . fenofibrate micronized (ANTARA) 130 MG capsule   Oral   Take 130 mg by mouth daily before breakfast.          . Garlic 1000 MG CAPS   Oral   Take 1 capsule by mouth daily.          . halobetasol (ULTRAVATE) 0.05 % cream   Topical   Apply 1 application topically 2 (two) times daily as needed. Psoriasis         . Multiple Vitamins-Minerals (MULTIVITAMINS THER. W/MINERALS)  TABS   Oral   Take 1 tablet by mouth daily.          Marland Kitchen omega-3 acid ethyl esters (LOVAZA) 1 G capsule   Oral   Take 1-2 g by mouth 2 (two) times daily. 1 CAPSULES IN THE MORNING AND 2 CAPSULES IN THE EVENING         . ramipril (ALTACE) 10 MG capsule   Oral   Take 10 mg by mouth every morning.            BP 134/60  Pulse 57  Temp(Src) 98.1 F (36.7 C) (Oral)  Resp 18  SpO2 96%  Physical Exam  Nursing note and vitals reviewed. Constitutional: He is oriented to person, place, and time. He appears well-developed and well-nourished.  HENT:  Head: Normocephalic and atraumatic.  Right Ear: External ear normal.  Left Ear: External ear normal.  Mouth/Throat: Oropharynx is clear and moist.  Eyes: Conjunctivae and EOM are normal. Pupils are equal,  round, and reactive to light.  Neck: Normal range of motion. Neck supple.  No deformity of neck or point tenderness over this posterior cervical spine.  Pulmonary/Chest: Effort normal and breath sounds normal. Tenderness: he has tenderness over her anterior chest to the left of the sternum at the level of the sixth through eighth ribs.  Abdominal: Soft. Bowel sounds are normal.  Musculoskeletal:  Mild tenderness in the left thigh, but no palpable bony deformity.  Neurological: He is alert and oriented to person, place, and time.  No sensory or motor deficit.  Skin: Skin is warm and dry.  Psychiatric: He has a normal mood and affect. His behavior is normal.    ED Course  Procedures (including critical care time)  Labs Reviewed  BASIC METABOLIC PANEL  CBC  URINALYSIS, ROUTINE W REFLEX MICROSCOPIC  PROTIME-INR  APTT  SAMPLE TO BLOOD BANK   12:53 PM Patient was seen and had physical examination. Laboratory tests were ordered. CT scan showed the chest was ordered. Patient is not in pain at rest, so no pain medication was ordered.  2:31 PM Results for orders placed during the hospital encounter of 04/07/13  CBC      Result Value Range   WBC 9.1  4.0 - 10.5 K/uL   RBC 4.30  4.22 - 5.81 MIL/uL   Hemoglobin 13.6  13.0 - 17.0 g/dL   HCT 45.4 (*) 09.8 - 11.9 %   MCV 87.2  78.0 - 100.0 fL   MCH 31.6  26.0 - 34.0 pg   MCHC 36.3 (*) 30.0 - 36.0 g/dL   RDW 14.7  82.9 - 56.2 %   Platelets 241  150 - 400 K/uL  PROTIME-INR      Result Value Range   Prothrombin Time 13.0  11.6 - 15.2 seconds   INR 0.99  0.00 - 1.49  APTT      Result Value Range   aPTT 29  24 - 37 seconds  POCT I-STAT TROPONIN I      Result Value Range   Troponin i, poc 0.00  0.00 - 0.08 ng/mL   Comment 3           POCT I-STAT, CHEM 8      Result Value Range   Sodium 137  135 - 145 mEq/L   Potassium 4.3  3.5 - 5.1 mEq/L   Chloride 105  96 - 112 mEq/L   BUN 21  6 - 23 mg/dL   Creatinine, Ser 1.30  0.50 -  1.35  mg/dL   Glucose, Bld 161 (*) 70 - 99 mg/dL   Calcium, Ion 0.96 (*) 1.13 - 1.30 mmol/L   TCO2 29  0 - 100 mmol/L   Hemoglobin 13.6  13.0 - 17.0 g/dL   HCT 04.5  40.9 - 81.1 %    2:31 PM Lab tests are reassuringly negative.  Results reported to pt.  Waiting for result of CT angio of the chest.  3:09 PM CT angio of chest was negative for PE, rib fracture, PTX. I advised him and his wife that his lab tests and x-rays were good.  He will need to continue to take Percocet for pain.  F/U at [facility].  1. Chest wall contusion, left, subsequent encounter        Carleene Cooper III, MD 04/07/13 [phone]

## 2013-04-07 NOTE — ED Notes (Signed)
Lab at bedside

## 2013-05-02 ENCOUNTER — Other Ambulatory Visit: Payer: Self-pay | Admitting: Orthopedic Surgery

## 2013-05-02 DIAGNOSIS — M25532 Pain in left wrist: Secondary | ICD-10-CM

## 2013-05-12 ENCOUNTER — Other Ambulatory Visit: Payer: Self-pay

## 2013-05-20 ENCOUNTER — Other Ambulatory Visit (HOSPITAL_COMMUNITY): Payer: Self-pay | Admitting: Orthopedic Surgery

## 2013-05-20 DIAGNOSIS — M25532 Pain in left wrist: Secondary | ICD-10-CM

## 2013-05-20 DIAGNOSIS — M79642 Pain in left hand: Secondary | ICD-10-CM

## 2013-05-30 ENCOUNTER — Encounter (HOSPITAL_COMMUNITY)
Admission: RE | Admit: 2013-05-30 | Discharge: 2013-05-30 | Disposition: A | Discharge status: Home / Self Care | Payer: Worker's Compensation | Source: Ambulatory Visit | Attending: Orthopedic Surgery | Admitting: Orthopedic Surgery

## 2013-05-30 DIAGNOSIS — W19XXXA Unspecified fall, initial encounter: Secondary | ICD-10-CM | POA: Insufficient documentation

## 2013-05-30 DIAGNOSIS — M25539 Pain in unspecified wrist: Secondary | ICD-10-CM | POA: Insufficient documentation

## 2013-05-30 DIAGNOSIS — M79642 Pain in left hand: Secondary | ICD-10-CM

## 2013-05-30 DIAGNOSIS — M25532 Pain in left wrist: Secondary | ICD-10-CM

## 2013-05-30 MED ORDER — TECHNETIUM TC 99M MEDRONATE IV KIT
25.0000 | PACK | Freq: Once | INTRAVENOUS | Status: AC | PRN
  Administered 2013-05-30: 25 via INTRAVENOUS

## 2013-05-31 ENCOUNTER — Encounter (HOSPITAL_COMMUNITY): Payer: Self-pay | Admitting: Pharmacy Technician

## 2013-06-02 ENCOUNTER — Ambulatory Visit (HOSPITAL_COMMUNITY)
Admission: RE | Admit: 2013-06-02 | Discharge: 2013-06-02 | Disposition: A | Discharge status: Home / Self Care | Payer: Worker's Compensation | Source: Ambulatory Visit | Attending: Surgical | Admitting: Surgical

## 2013-06-02 ENCOUNTER — Encounter (HOSPITAL_COMMUNITY)
Admission: RE | Admit: 2013-06-02 | Discharge: 2013-06-02 | Disposition: A | Discharge status: Home / Self Care | Payer: Worker's Compensation | Source: Ambulatory Visit | Attending: Orthopedic Surgery | Admitting: Orthopedic Surgery

## 2013-06-02 ENCOUNTER — Encounter (HOSPITAL_COMMUNITY): Payer: Self-pay

## 2013-06-02 HISTORY — DX: Hyperlipidemia, unspecified: E78.5

## 2013-06-02 HISTORY — DX: Psoriasis, unspecified: L40.9

## 2013-06-02 HISTORY — DX: Personal history of other medical treatment: Z92.89

## 2013-06-02 HISTORY — DX: Personal history of other endocrine, nutritional and metabolic disease: Z86.39

## 2013-06-02 HISTORY — DX: Gastro-esophageal reflux disease without esophagitis: K21.9

## 2013-06-02 LAB — COMPREHENSIVE METABOLIC PANEL
ALT: 45 U/L (ref 0–53)
AST: 40 U/L — ABNORMAL HIGH (ref 0–37)
Albumin: 4.2 g/dL (ref 3.5–5.2)
Alkaline Phosphatase: 31 U/L — ABNORMAL LOW (ref 39–117)
BUN: 17 mg/dL (ref 6–23)
CO2: 29 mEq/L (ref 19–32)
Calcium: 10.5 mg/dL (ref 8.4–10.5)
Chloride: 104 mEq/L (ref 96–112)
Creatinine, Ser: 1.46 mg/dL — ABNORMAL HIGH (ref 0.50–1.35)
GFR calc Af Amer: 58 mL/min — ABNORMAL LOW (ref >90)
GFR calc non Af Amer: 50 mL/min — ABNORMAL LOW (ref >90)
Glucose, Bld: 132 mg/dL — ABNORMAL HIGH (ref 70–99)
Potassium: 4 mEq/L (ref 3.5–5.1)
Sodium: 139 mEq/L (ref 135–145)
Total Bilirubin: 0.5 mg/dL (ref 0.3–1.2)
Total Protein: 7.6 g/dL (ref 6.0–8.3)

## 2013-06-02 LAB — CBC
HCT: 40.5 % (ref 39.0–52.0)
Hemoglobin: 13.9 g/dL (ref 13.0–17.0)
MCH: 31 pg (ref 26.0–34.0)
MCHC: 34.3 g/dL (ref 30.0–36.0)
MCV: 90.2 fL (ref 78.0–100.0)
RDW: 13.2 % (ref 11.5–15.5)

## 2013-06-02 LAB — URINALYSIS, ROUTINE W REFLEX MICROSCOPIC
Bilirubin Urine: NEGATIVE
Glucose, UA: NEGATIVE mg/dL
Hgb urine dipstick: NEGATIVE
Ketones, ur: NEGATIVE mg/dL
Leukocytes, UA: NEGATIVE
Nitrite: NEGATIVE
Protein, ur: NEGATIVE mg/dL
Specific Gravity, Urine: 1.019 (ref 1.005–1.030)
Urobilinogen, UA: 1 mg/dL (ref 0.0–1.0)
pH: 7.5 (ref 5.0–8.0)

## 2013-06-02 LAB — PROTIME-INR
INR: 1.03 (ref 0.00–1.49)
Prothrombin Time: 13.3 seconds (ref 11.6–15.2)

## 2013-06-02 LAB — URINE MICROSCOPIC-ADD ON

## 2013-06-02 LAB — APTT: aPTT: 28 seconds (ref 24–37)

## 2013-06-02 NOTE — Progress Notes (Signed)
06/02/13 1240  OBSTRUCTIVE SLEEP APNEA  Have you ever been diagnosed with sleep apnea through a sleep study? No  Do you snore loudly (loud enough to be heard through closed doors)?  1  Do you often feel tired, fatigued, or sleepy during the daytime? 0  Has anyone observed you stop breathing during your sleep? 0  Do you have, or are you being treated for high blood pressure? 1  BMI more than 35 kg/m2? 0  Age over 63 years old? 1  Neck circumference greater than 40 cm/18 inches? 1  Gender: 1  Obstructive Sleep Apnea Score 5  Score 4 or greater  Results sent to PCP    06/02/13 1240  OBSTRUCTIVE SLEEP APNEA  Have you ever been diagnosed with sleep apnea through a sleep study? No  Do you snore loudly (loud enough to be heard through closed doors)?  1  Do you often feel tired, fatigued, or sleepy during the daytime? 0  Has anyone observed you stop breathing during your sleep? 0  Do you have, or are you being treated for high blood pressure? 1  BMI more than 35 kg/m2? 0  Age over 57 years old? 1  Neck circumference greater than 40 cm/18 inches? 1  Gender: 1  Obstructive Sleep Apnea Score 5  Score 4 or greater  Results sent to PCP

## 2013-06-02 NOTE — Progress Notes (Signed)
EKG 5/14 EPIC 

## 2013-06-02 NOTE — Progress Notes (Signed)
Faxed urine with micro to dr Darrelyn Hillock through [facility]

## 2013-06-02 NOTE — Patient Instructions (Addendum)
20 SAUD BAIL  06/02/2013   Your procedure is scheduled on:  06/08/13  [facility]  Report to [facility] at  0630     AM.  Call this number if you have problems the morning of surgery: [phone]       Remember:   Do not eat food  Or drink :After Midnight.TUESDAY NIGHT   Take these medicines the morning of surgery with A SIP OF WATER: NORVASC, PROLISEC                      OXYCODONE   .  Contacts, dentures or partial plates can not be worn to surgery  Leave suitcase in the car. After surgery it may be brought to your room.  For patients admitted to the hospital, checkout time is 11:00 AM day of  discharge.             SPECIAL INSTRUCTIONS- SEE [facility] PREPARING FOR SURGERY INSTRUCTION SHEET-     DO NOT WEAR JEWELRY, LOTIONS, POWDERS, OR PERFUMES.  WOMEN-- DO NOT SHAVE LEGS OR UNDERARMS FOR 12 HOURS BEFORE SHOWERS. MEN MAY SHAVE FACE.  Patients discharged the day of surgery will not be allowed to drive home. IF going home the day of surgery, you must have a driver and someone to stay with you for the first 24 hours  Name and phone number of your driver:    [employer]                                                                    Please read over the following fact sheets that you were givenIncentive Spirometry Sheet,                                                                                  Jarrad Mclees  PST [phone]  [phone]                 FAILURE TO FOLLOW THESE INSTRUCTIONS MAY RESULT IN  CANCELLATION   OF YOUR SURGERY                                                  Patient Signature _____________________________

## 2013-06-05 NOTE — H&P (Signed)
  Keith Oconnell DOB: 11/14/1957  Chief complaint: left shoulder pain  History of Present Illness The patient is a 62 year old male who presents with shoulder complaints. They are left handed and present today reporting pain, catching, pain with overhead motions, pain with throwing, pain with reaching, pain with lifting and tightness at the left shoulder and left superior shoulder that began 2 months ago. The patient reports that the shoulder symptoms began following a specific injury. The injury resulted from a fall onto the shoulder. The onset of symptoms was sudden. The patient reports symptoms which include shoulder pain, catching and decreased range of motion. The patient reports symptoms that radiate to the left upper arm. The patient describes these symptoms as severe and worsening. His MRI shows a 4.2 cm. full thickness supraspinatus and infraspinatus tear with retraction of about 3 cm.    Allergies Morphine Sulfate  Nausea, Vomiting.   Medication History Percocet (5-325MG  Tablet, Oral) Active. Lovaza (1GM Capsule, Oral) Active. Simvastatin (20MG  Tablet, Oral) Active. AmLODIPine Besylate (5MG  Tablet, Oral) Active. Fenofibrate Micronized (134MG  Capsule, Oral) Active. Hydrochlorothiazide (25MG  Tablet, Oral) Active. Lisinopril-Hydrochlorothiazide (20-25MG  Tablet, Oral) Active. Halobetasol Propionate (0.05% Cream, External) Active. Ramipril (10MG  Capsule, Oral) Active.   Vitals Weight: 223.38 lb Height: 70 in Body Surface Area: 2.24 m Body Mass Index: 32.05 kg/m BP: 160/70 (Sitting, Right Arm, Standard)    Objective On physical examination of the left shoulder, he has marked pain with motion. He has definite weakness with abduction if you compare it with the operative side they operated on the right side. He has pain with all ranges of motion. There are no masses and no tumors. Axillary region is free of nodes. The supraclavicular area looks fine. The  [facility] joint is well located. His shoulder is well located. He has good function into the elbow. The wrist, he has an Orthoplast splint. On auscultating his lungs, his lungs are clear. Normal sinus rhythm and no murmur. RRR. Abdomen soft and nontender. Bowel sounds active. Neck supple, no bruit.   RADIOGRAPHS: X-rays show the small avulsion type fracture off of the greater tuberosity, a very small fragment. His humeral head is riding high in the glenoid.    Assessment and Plan Left shoulder, rotator cuff tear He needs an open repair of his left rotator cuff. We may or may not need to use a graft material that is made from calf skin. This is approved by the FDA and I have not had a rejection of that graft yet. Also, we may need to use anchors. These are polyethylene anchors that stay in the bone and we use those anchors to suture the tendon down in certain cases where the tendon is completely pulled off the bone. There is always a chance of a secondary infection obviously with any surgery but we do use antibiotics preop.   Dimitri Ped, PA-C

## 2013-06-06 ENCOUNTER — Inpatient Hospital Stay (HOSPITAL_COMMUNITY): Admission: RE | Admit: 2013-06-06 | Payer: Self-pay | Source: Ambulatory Visit

## 2013-06-08 ENCOUNTER — Encounter (HOSPITAL_COMMUNITY): Payer: Self-pay | Admitting: Anesthesiology

## 2013-06-08 ENCOUNTER — Ambulatory Visit (HOSPITAL_COMMUNITY): Payer: Worker's Compensation | Admitting: Anesthesiology

## 2013-06-08 ENCOUNTER — Observation Stay (HOSPITAL_COMMUNITY)
Admission: RE | Admit: 2013-06-08 | Discharge: 2013-06-09 | Disposition: A | Discharge status: Home / Self Care | Payer: Worker's Compensation | Source: Ambulatory Visit | Attending: Orthopedic Surgery | Admitting: Orthopedic Surgery

## 2013-06-08 ENCOUNTER — Encounter (HOSPITAL_COMMUNITY): Payer: Self-pay | Admitting: *Deleted

## 2013-06-08 ENCOUNTER — Encounter (HOSPITAL_COMMUNITY)
Admission: RE | Disposition: A | Discharge status: Home / Self Care | Payer: Self-pay | Source: Ambulatory Visit | Attending: Orthopedic Surgery

## 2013-06-08 DIAGNOSIS — M719 Bursopathy, unspecified: Principal | ICD-10-CM | POA: Insufficient documentation

## 2013-06-08 DIAGNOSIS — Z79899 Other long term (current) drug therapy: Secondary | ICD-10-CM | POA: Insufficient documentation

## 2013-06-08 DIAGNOSIS — S42253A Displaced fracture of greater tuberosity of unspecified humerus, initial encounter for closed fracture: Secondary | ICD-10-CM | POA: Insufficient documentation

## 2013-06-08 DIAGNOSIS — M67919 Unspecified disorder of synovium and tendon, unspecified shoulder: Principal | ICD-10-CM | POA: Insufficient documentation

## 2013-06-08 DIAGNOSIS — I10 Essential (primary) hypertension: Secondary | ICD-10-CM | POA: Insufficient documentation

## 2013-06-08 DIAGNOSIS — K219 Gastro-esophageal reflux disease without esophagitis: Secondary | ICD-10-CM | POA: Insufficient documentation

## 2013-06-08 DIAGNOSIS — Z9889 Other specified postprocedural states: Secondary | ICD-10-CM

## 2013-06-08 DIAGNOSIS — X58XXXA Exposure to other specified factors, initial encounter: Secondary | ICD-10-CM | POA: Insufficient documentation

## 2013-06-08 HISTORY — PX: SHOULDER OPEN ROTATOR CUFF REPAIR: SHX2407

## 2013-06-08 SURGERY — REPAIR, ROTATOR CUFF, OPEN
Anesthesia: General | Site: Shoulder | Laterality: Left | Wound class: Clean

## 2013-06-08 MED ORDER — LACTATED RINGERS IV SOLN
INTRAVENOUS | Status: DC
  Administered 2013-06-08: 22:00:00 via INTRAVENOUS
  Administered 2013-06-08: 1000 mL via INTRAVENOUS

## 2013-06-08 MED ORDER — ACETAMINOPHEN 10 MG/ML IV SOLN
INTRAVENOUS | Status: DC | PRN
  Administered 2013-06-08: 1000 mg via INTRAVENOUS

## 2013-06-08 MED ORDER — POLYETHYLENE GLYCOL 3350 17 G PO PACK: 17.0000 g | PACK | Freq: Every day | ORAL | Status: DC | PRN

## 2013-06-08 MED ORDER — OXYCODONE-ACETAMINOPHEN 5-325 MG PO TABS: 1.0000 | ORAL_TABLET | ORAL | Status: DC | PRN

## 2013-06-08 MED ORDER — SODIUM CHLORIDE 0.9 % IR SOLN
Status: DC | PRN
  Administered 2013-06-08: 09:00:00

## 2013-06-08 MED ORDER — LACTATED RINGERS IV SOLN: INTRAVENOUS | Status: DC

## 2013-06-08 MED ORDER — PROMETHAZINE HCL 25 MG/ML IJ SOLN: 6.2500 mg | INTRAMUSCULAR | Status: DC | PRN

## 2013-06-08 MED ORDER — DEXTROSE 5 % IV SOLN
500.0000 mg | Freq: Four times a day (QID) | INTRAVENOUS | Status: DC | PRN
  Filled 2013-06-08: qty 5

## 2013-06-08 MED ORDER — CISATRACURIUM BESYLATE (PF) 10 MG/5ML IV SOLN
INTRAVENOUS | Status: DC | PRN
  Administered 2013-06-08: 10 mg via INTRAVENOUS

## 2013-06-08 MED ORDER — HYDROCODONE-ACETAMINOPHEN 5-325 MG PO TABS
1.0000 | ORAL_TABLET | ORAL | Status: DC | PRN
  Administered 2013-06-08 – 2013-06-09 (×5): 2 via ORAL
  Filled 2013-06-08 (×5): qty 2

## 2013-06-08 MED ORDER — MIDAZOLAM HCL 5 MG/5ML IJ SOLN
INTRAMUSCULAR | Status: DC | PRN
  Administered 2013-06-08: 1 mg via INTRAVENOUS

## 2013-06-08 MED ORDER — ONDANSETRON HCL 4 MG PO TABS
4.0000 mg | ORAL_TABLET | Freq: Four times a day (QID) | ORAL | Status: DC | PRN
  Administered 2013-06-09: 4 mg via ORAL
  Filled 2013-06-08: qty 1

## 2013-06-08 MED ORDER — LIDOCAINE HCL (CARDIAC) 20 MG/ML IV SOLN
INTRAVENOUS | Status: DC | PRN
  Administered 2013-06-08: 30 mg via INTRAVENOUS

## 2013-06-08 MED ORDER — ACETAMINOPHEN 500 MG PO TABS: 1000.0000 mg | ORAL_TABLET | Freq: Once | ORAL | Status: DC

## 2013-06-08 MED ORDER — CELECOXIB 200 MG PO CAPS
200.0000 mg | ORAL_CAPSULE | Freq: Once | ORAL | Status: AC
  Administered 2013-06-08: 200 mg via ORAL

## 2013-06-08 MED ORDER — FENTANYL CITRATE 0.05 MG/ML IJ SOLN
INTRAMUSCULAR | Status: DC | PRN
  Administered 2013-06-08: 25 ug via INTRAVENOUS
  Administered 2013-06-08 (×3): 100 ug via INTRAVENOUS

## 2013-06-08 MED ORDER — ACETAMINOPHEN 650 MG RE SUPP: 650.0000 mg | Freq: Four times a day (QID) | RECTAL | Status: DC | PRN

## 2013-06-08 MED ORDER — LISINOPRIL-HYDROCHLOROTHIAZIDE 20-25 MG PO TABS: 1.0000 | ORAL_TABLET | Freq: Every day | ORAL | Status: DC

## 2013-06-08 MED ORDER — CEFAZOLIN SODIUM 1-5 GM-% IV SOLN
1.0000 g | Freq: Four times a day (QID) | INTRAVENOUS | Status: AC
  Administered 2013-06-08 – 2013-06-09 (×3): 1 g via INTRAVENOUS
  Filled 2013-06-08 (×3): qty 50

## 2013-06-08 MED ORDER — CEFAZOLIN SODIUM-DEXTROSE 2-3 GM-% IV SOLR
2.0000 g | INTRAVENOUS | Status: AC
  Administered 2013-06-08: 2 g via INTRAVENOUS

## 2013-06-08 MED ORDER — HYDROMORPHONE HCL PF 1 MG/ML IJ SOLN
0.2500 mg | INTRAMUSCULAR | Status: DC | PRN
  Administered 2013-06-08 (×4): 0.5 mg via INTRAVENOUS

## 2013-06-08 MED ORDER — ASPIRIN EC 325 MG PO TBEC
325.0000 mg | DELAYED_RELEASE_TABLET | Freq: Every day | ORAL | Status: DC
  Administered 2013-06-08 – 2013-06-09 (×2): 325 mg via ORAL
  Filled 2013-06-08 (×2): qty 1

## 2013-06-08 MED ORDER — METHOCARBAMOL 500 MG PO TABS
500.0000 mg | ORAL_TABLET | Freq: Four times a day (QID) | ORAL | Status: DC | PRN
  Administered 2013-06-08 – 2013-06-09 (×2): 500 mg via ORAL
  Filled 2013-06-08 (×2): qty 1

## 2013-06-08 MED ORDER — SIMVASTATIN 20 MG PO TABS
20.0000 mg | ORAL_TABLET | Freq: Every evening | ORAL | Status: DC
  Administered 2013-06-08: 20 mg via ORAL
  Filled 2013-06-08 (×2): qty 1

## 2013-06-08 MED ORDER — ACETAMINOPHEN 10 MG/ML IV SOLN
1000.0000 mg | Freq: Once | INTRAVENOUS | Status: DC | PRN
  Filled 2013-06-08: qty 100

## 2013-06-08 MED ORDER — ACETAMINOPHEN 500 MG PO TABS
ORAL_TABLET | ORAL | Status: AC
  Filled 2013-06-08: qty 2

## 2013-06-08 MED ORDER — HYDROMORPHONE HCL PF 1 MG/ML IJ SOLN
INTRAMUSCULAR | Status: AC
  Filled 2013-06-08: qty 1

## 2013-06-08 MED ORDER — NEOSTIGMINE METHYLSULFATE 1 MG/ML IJ SOLN
INTRAMUSCULAR | Status: DC | PRN
  Administered 2013-06-08: 3 mg via INTRAVENOUS

## 2013-06-08 MED ORDER — HYDROCHLOROTHIAZIDE 25 MG PO TABS
25.0000 mg | ORAL_TABLET | Freq: Every day | ORAL | Status: DC
  Administered 2013-06-09: 25 mg via ORAL
  Filled 2013-06-08: qty 1

## 2013-06-08 MED ORDER — AMLODIPINE BESYLATE 5 MG PO TABS
5.0000 mg | ORAL_TABLET | Freq: Every day | ORAL | Status: DC
Start: 2013-06-09 — End: 2013-06-09
  Administered 2013-06-09: 5 mg via ORAL
  Filled 2013-06-08: qty 1

## 2013-06-08 MED ORDER — LACTATED RINGERS IV SOLN
INTRAVENOUS | Status: DC | PRN
  Administered 2013-06-08: 08:00:00 via INTRAVENOUS

## 2013-06-08 MED ORDER — LISINOPRIL 20 MG PO TABS
20.0000 mg | ORAL_TABLET | Freq: Every day | ORAL | Status: DC
  Administered 2013-06-09: 20 mg via ORAL
  Filled 2013-06-08: qty 1

## 2013-06-08 MED ORDER — HYDROMORPHONE HCL PF 1 MG/ML IJ SOLN
0.5000 mg | INTRAMUSCULAR | Status: DC | PRN
  Administered 2013-06-08 – 2013-06-09 (×7): 1 mg via INTRAVENOUS
  Filled 2013-06-08 (×7): qty 1

## 2013-06-08 MED ORDER — ONDANSETRON HCL 4 MG/2ML IJ SOLN
INTRAMUSCULAR | Status: DC | PRN
  Administered 2013-06-08 (×2): 2 mg via INTRAVENOUS

## 2013-06-08 MED ORDER — CEFAZOLIN SODIUM-DEXTROSE 2-3 GM-% IV SOLR
INTRAVENOUS | Status: AC
  Filled 2013-06-08: qty 50

## 2013-06-08 MED ORDER — MEPERIDINE HCL 50 MG/ML IJ SOLN: 6.2500 mg | INTRAMUSCULAR | Status: DC | PRN

## 2013-06-08 MED ORDER — EPHEDRINE SULFATE 50 MG/ML IJ SOLN
INTRAMUSCULAR | Status: DC | PRN
  Administered 2013-06-08: 10 mg via INTRAVENOUS

## 2013-06-08 MED ORDER — GLYCOPYRROLATE 0.2 MG/ML IJ SOLN
INTRAMUSCULAR | Status: DC | PRN
  Administered 2013-06-08: 0.4 mg via INTRAVENOUS

## 2013-06-08 MED ORDER — THROMBIN 5000 UNITS EX SOLR
CUTANEOUS | Status: DC | PRN
  Administered 2013-06-08: 5000 [IU] via TOPICAL

## 2013-06-08 MED ORDER — PANTOPRAZOLE SODIUM 40 MG PO TBEC: 40.0000 mg | DELAYED_RELEASE_TABLET | Freq: Every day | ORAL | Status: DC | PRN

## 2013-06-08 MED ORDER — SUCCINYLCHOLINE CHLORIDE 20 MG/ML IJ SOLN
INTRAMUSCULAR | Status: DC | PRN
  Administered 2013-06-08: 140 mg via INTRAVENOUS

## 2013-06-08 MED ORDER — OMEPRAZOLE MAGNESIUM 20 MG PO TBEC: 20.0000 mg | DELAYED_RELEASE_TABLET | Freq: Every day | ORAL | Status: DC | PRN

## 2013-06-08 MED ORDER — ONDANSETRON HCL 4 MG/2ML IJ SOLN: 4.0000 mg | Freq: Four times a day (QID) | INTRAMUSCULAR | Status: DC | PRN

## 2013-06-08 MED ORDER — PROPOFOL 10 MG/ML IV BOLUS
INTRAVENOUS | Status: DC | PRN
  Administered 2013-06-08: 200 mg via INTRAVENOUS

## 2013-06-08 MED ORDER — BUPIVACAINE LIPOSOME 1.3 % IJ SUSP
20.0000 mL | Freq: Once | INTRAMUSCULAR | Status: DC
  Filled 2013-06-08: qty 20

## 2013-06-08 MED ORDER — CELECOXIB 200 MG PO CAPS
ORAL_CAPSULE | ORAL | Status: AC
  Filled 2013-06-08: qty 1

## 2013-06-08 MED ORDER — BUPIVACAINE LIPOSOME 1.3 % IJ SUSP
INTRAMUSCULAR | Status: DC | PRN
  Administered 2013-06-08: 20 mL

## 2013-06-08 MED ORDER — PHENOL 1.4 % MT LIQD: 1.0000 | OROMUCOSAL | Status: DC | PRN

## 2013-06-08 MED ORDER — BISACODYL 10 MG RE SUPP: 10.0000 mg | Freq: Every day | RECTAL | Status: DC | PRN

## 2013-06-08 MED ORDER — MENTHOL 3 MG MT LOZG: 1.0000 | LOZENGE | OROMUCOSAL | Status: DC | PRN

## 2013-06-08 MED ORDER — ACETAMINOPHEN 325 MG PO TABS: 650.0000 mg | ORAL_TABLET | Freq: Four times a day (QID) | ORAL | Status: DC | PRN

## 2013-06-08 MED ORDER — KETAMINE HCL 10 MG/ML IJ SOLN
INTRAMUSCULAR | Status: DC | PRN
  Administered 2013-06-08: 20 mg via INTRAVENOUS

## 2013-06-08 SURGICAL SUPPLY — 49 items
ADH SKN CLS APL DERMABOND .7 (GAUZE/BANDAGES/DRESSINGS) ×1
ANCH SUT 2 5.5 BABSR ASCP (Orthopedic Implant) ×3 IMPLANT
ANCHOR PEEK ZIP 5.5 NDL NO2 (Orthopedic Implant) ×3 IMPLANT
BAG SPEC THK2 15X12 ZIP CLS (MISCELLANEOUS)
BAG ZIPLOCK 12X15 (MISCELLANEOUS) ×1 IMPLANT
BLADE OSCILLATING/SAGITTAL (BLADE) ×2
BLADE SW THK.38XMED LNG THN (BLADE) ×1 IMPLANT
BNDG COHESIVE 6X5 TAN NS LF (GAUZE/BANDAGES/DRESSINGS) ×1 IMPLANT
BUR OVAL CARBIDE 4.0 (BURR) ×2 IMPLANT
CLEANER TIP ELECTROSURG 2X2 (MISCELLANEOUS) ×2 IMPLANT
CLOTH BEACON ORANGE TIMEOUT ST (SAFETY) ×2 IMPLANT
DERMABOND ADVANCED (GAUZE/BANDAGES/DRESSINGS) ×1
DERMABOND ADVANCED .7 DNX12 (GAUZE/BANDAGES/DRESSINGS) IMPLANT
DRAPE POUCH INSTRU U-SHP 10X18 (DRAPES) ×2 IMPLANT
DRSG AQUACEL AG ADV 3.5X 6 (GAUZE/BANDAGES/DRESSINGS) ×1 IMPLANT
DRSG EMULSION OIL 3X3 NADH (GAUZE/BANDAGES/DRESSINGS) ×1 IMPLANT
DRSG PAD ABDOMINAL 8X10 ST (GAUZE/BANDAGES/DRESSINGS) ×2 IMPLANT
DURAPREP 26ML APPLICATOR (WOUND CARE) ×2 IMPLANT
ELECT REM PT RETURN 9FT ADLT (ELECTROSURGICAL) ×2
ELECTRODE REM PT RTRN 9FT ADLT (ELECTROSURGICAL) ×1 IMPLANT
GLOVE BIOGEL PI IND STRL 8 (GLOVE) ×1 IMPLANT
GLOVE BIOGEL PI INDICATOR 8 (GLOVE) ×1
GLOVE ECLIPSE 8.0 STRL XLNG CF (GLOVE) ×7 IMPLANT
GLOVE SURG SS PI 6.5 STRL IVOR (GLOVE) ×4 IMPLANT
GOWN PREVENTION PLUS LG XLONG (DISPOSABLE) ×5 IMPLANT
KIT BASIN OR (CUSTOM PROCEDURE TRAY) ×2 IMPLANT
MANIFOLD NEPTUNE II (INSTRUMENTS) ×2 IMPLANT
NDL MA TROC 1/2 (NEEDLE) IMPLANT
NEEDLE MA TROC 1/2 (NEEDLE) IMPLANT
NS IRRIG 1000ML POUR BTL (IV SOLUTION) IMPLANT
PACK SHOULDER CUSTOM OPM052 (CUSTOM PROCEDURE TRAY) ×2 IMPLANT
PASSER SUT SWANSON 36MM LOOP (INSTRUMENTS) IMPLANT
PATCH TISSUE MEND 3X3CM (Orthopedic Implant) ×1 IMPLANT
POSITIONER SURGICAL ARM (MISCELLANEOUS) ×2 IMPLANT
SLING ARM IMMOBILIZER LRG (SOFTGOODS) ×2 IMPLANT
SPONGE SURGIFOAM ABS GEL 100 (HEMOSTASIS) ×1 IMPLANT
STAPLER VISISTAT 35W (STAPLE) ×1 IMPLANT
STRIP CLOSURE SKIN 1/2X4 (GAUZE/BANDAGES/DRESSINGS) ×1 IMPLANT
SUCTION FRAZIER 12FR DISP (SUCTIONS) ×2 IMPLANT
SUT BONE WAX W31G (SUTURE) ×2 IMPLANT
SUT ETHIBOND NAB CT1 #1 30IN (SUTURE) ×2 IMPLANT
SUT MNCRL AB 4-0 PS2 18 (SUTURE) ×2 IMPLANT
SUT VIC AB 0 CT1 27 (SUTURE)
SUT VIC AB 0 CT1 27XBRD ANTBC (SUTURE) ×1 IMPLANT
SUT VIC AB 1 CT1 27 (SUTURE) ×4
SUT VIC AB 1 CT1 27XBRD ANTBC (SUTURE) ×2 IMPLANT
SUT VIC AB 2-0 CT1 27 (SUTURE) ×4
SUT VIC AB 2-0 CT1 27XBRD (SUTURE) IMPLANT
TOWEL OR 17X26 10 PK STRL BLUE (TOWEL DISPOSABLE) ×3 IMPLANT

## 2013-06-08 NOTE — Brief Op Note (Signed)
06/08/2013  10:05 AM  PATIENT:  Keith Oconnell  62 y.o. male  PRE-OPERATIVE DIAGNOSIS:  LEFT SHOULDER ROTATOR CUFF TEAR  POST-OPERATIVE DIAGNOSIS:  left shoulder rotator cuff tear,COMPLEX,Retracted,Complete.  PROCEDURE:  Procedure(s): OPEN LEFT SHOULDER ROTATOR CUFF REPAIR , complex repair with 3 anchors and graft (Left)  SURGEON:  Surgeon(s) and Role:    * Jacki Cones, MD - Primary  PHYSICIAN ASSISTANT: Dimitri Ped PA  ASSISTANTS:Amber Havre [place] PA   ANESTHESIA:   general  EBL:     BLOOD ADMINISTERED:none  DRAINS: none   LOCAL MEDICATIONS USED:  BUPIVICAINE 20cc.  SPECIMEN:  No Specimen  DISPOSITION OF SPECIMEN:  N/A  COUNTS:  YES  TOURNIQUET:  * No tourniquets in log *  DICTATION: .Other Dictation: Dictation Number [id number]  PLAN OF CARE: Admit for overnight observation  PATIENT DISPOSITION:  Stable in OR.   Delay start of Pharmacological VTE agent (>24hrs) due to surgical blood loss or risk of bleeding: yes

## 2013-06-08 NOTE — Interval H&P Note (Signed)
History and Physical Interval Note:  06/08/2013 8:19 AM  Keith Oconnell  has presented today for surgery, with the diagnosis of LEFT SHOULDER ROTATOR CUFF TEAR  The various methods of treatment have been discussed with the patient and family. After consideration of risks, benefits and other options for treatment, the patient has consented to  Procedure(s): OPEN LEFT SHOULDER ROTATOR CUFF REPAIR  (Left) as a surgical intervention .  The patient's history has been reviewed, patient examined, no change in status, stable for surgery.  I have reviewed the patient's chart and labs.  Questions were answered to the patient's satisfaction.     Tanairy Payeur A

## 2013-06-08 NOTE — Op Note (Signed)
NAME:  Keith Oconnell, Keith Oconnell               ACCOUNT NO.:  [id number]  MEDICAL RECORD NO.:  [id number]  LOCATION:  [facility]                         FACILITY:  [facility]  PHYSICIAN:  Georges Lynch. Naythen Heikkila, M.D.DATE OF BIRTH:  1951/04/04  DATE OF PROCEDURE:  06/08/2013 DATE OF DISCHARGE:                              OPERATIVE REPORT   SURGEON:  Georges Lynch. Darrelyn Hillock, M.D.  OPERATIVE ASSISTANT:  Dimitri Ped, PA.  PREOPERATIVE DIAGNOSIS:  Severe complex complete retracted tear of the left rotator cuff.  POSTOPERATIVE DIAGNOSIS:  Severe complex complete retracted tear of the left rotator cuff.  OPERATION: 1. Repair of the remaining portion of the left rotator cuff. 2. TissueMend graft, this was all on the left. 3. Three anchors were used. 4. I did an open acromionectomy and acromioplasty.  PROCEDURE:  Under general anesthesia with the patient in a beach-chair position, routine orthopedic prep and draping of left shoulder was carried out.  The patient had 2 g of IV Ancef.  At this time, the appropriate time-out was carried out.  I also marked the appropriate left arm in the holding area.  An incision was made over the anterior aspect of the left acromion.  Bleeding was identified and cauterized.  I then made the incision down to the small proximal part of the deltoid muscle for exposure purposes.  I then went in and separated the deltoid tendon from the acromion.  I then protected the underlying humeral head with a Bennett retractor and I utilized the oscillating saw and a bur to do an acromionectomy and acromioplasty.  Once this was done, I then inspected the cuff about 1/2 of the cup was remaining and the cup was in terrible condition.  He had a severe arthritic-type cup.  I then took the gloved finger, freed up the adhesions from the cup, brought the remaining rotator cup, which was medially displaced.  I brought it forward as much as I could.  I utilized the bur to bur the lateral articular  surface of the humerus.  I then inserted 2 anchors and then anchored the tendon down in place.  We still had a small bare area present.  I then utilized a TissueMend graft to help reinforce this repair and help cover that bald area.  I then bone waxed the undersurface of the acromion and thoroughly irrigated out the shoulder during the procedure, I then closed the wound in layers in usual fashion.  We injected 20 mL of bupivacaine into the soft tissue structures prior to closing the wound.  Sterile dressings were applied. The patient was placed in a shoulder immobilizer.         ______________________________ Georges Lynch Darrelyn Hillock, M.D.    RAG/MEDQ  D:  06/08/2013  T:  06/08/2013  Job:  [id number]

## 2013-06-08 NOTE — Anesthesia Postprocedure Evaluation (Signed)
Anesthesia Post Note  Patient: Keith Oconnell  Procedure(s) Performed: Procedure(s) (LRB): OPEN LEFT SHOULDER ROTATOR CUFF REPAIR , complex repair with 3 anchors and graft (Left)  Anesthesia type: General  Patient location: PACU  Post pain: Pain level controlled  Post assessment: Post-op Vital signs reviewed  Last Vitals: BP 130/72  Pulse 82  Temp(Src) 36.4 C (Oral)  Resp 20  Ht 6' (1.829 m)  Wt 200 lb (90.719 kg)  BMI 27.12 kg/m2  SpO2 97%  Post vital signs: Reviewed  Level of consciousness: sedated  Complications: No apparent anesthesia complications

## 2013-06-08 NOTE — Transfer of Care (Signed)
Immediate Anesthesia Transfer of Care Note  Patient: Keith Oconnell  Procedure(s) Performed: Procedure(s): OPEN LEFT SHOULDER ROTATOR CUFF REPAIR , complex repair with 3 anchors and graft (Left)  Patient Location: PACU  Anesthesia Type:General  Level of Consciousness: awake and sedated  Airway & Oxygen Therapy: Patient Spontanous Breathing and Patient connected to face mask oxygen  Post-op Assessment: Report given to PACU RN and Post -op Vital signs reviewed and stable  Post vital signs: stable  Complications: No apparent anesthesia complications

## 2013-06-08 NOTE — Anesthesia Preprocedure Evaluation (Signed)
Anesthesia Evaluation  Patient identified by MRN, date of birth, ID band Patient awake    Reviewed: Allergy & Precautions, H&P , NPO status , Patient's Chart, lab work & pertinent test results  Airway Mallampati: II TM Distance: >3 FB     Dental  (+) Teeth Intact and Dental Advisory Given,    Pulmonary neg pulmonary ROS,  breath sounds clear to auscultation  Pulmonary exam normal       Cardiovascular Exercise Tolerance: Good hypertension, Pt. on medications     Neuro/Psych negative neurological ROS  negative psych ROS   GI/Hepatic Neg liver ROS, GERD-  Medicated,  Endo/Other  negative endocrine ROS  Renal/GU negative Renal ROS     Musculoskeletal negative musculoskeletal ROS (+)   Abdominal Normal abdominal exam  (+)   Peds  Hematology negative hematology ROS (+)   Anesthesia Other Findings   Reproductive/Obstetrics                           Anesthesia Physical  Anesthesia Plan  ASA: II  Anesthesia Plan: General   Post-op Pain Management:    Induction: Intravenous  Airway Management Planned: Oral ETT  Additional Equipment:   Intra-op Plan:   Post-operative Plan: Extubation in OR  Informed Consent: I have reviewed the patients History and Physical, chart, labs and discussed the procedure including the risks, benefits and alternatives for the proposed anesthesia with the patient or authorized representative who has indicated his/her understanding and acceptance.   Dental advisory given  Plan Discussed with: CRNA  Anesthesia Plan Comments:         Anesthesia Quick Evaluation

## 2013-06-09 ENCOUNTER — Encounter (HOSPITAL_COMMUNITY): Payer: Self-pay | Admitting: Orthopedic Surgery

## 2013-06-09 MED ORDER — OXYCODONE-ACETAMINOPHEN 5-325 MG PO TABS: 1.0000 | ORAL_TABLET | ORAL | Status: DC | PRN

## 2013-06-09 MED ORDER — RANITIDINE HCL 150 MG/10ML PO SYRP
150.0000 mg | ORAL_SOLUTION | Freq: Once | ORAL | Status: AC
  Administered 2013-06-09: 150 mg via ORAL
  Filled 2013-06-09: qty 10

## 2013-06-09 MED ORDER — METHOCARBAMOL 500 MG PO TABS: 500.0000 mg | ORAL_TABLET | Freq: Four times a day (QID) | ORAL | Status: DC | PRN

## 2013-06-09 MED ORDER — ALUM & MAG HYDROXIDE-SIMETH 200-200-20 MG/5ML PO SUSP
30.0000 mL | ORAL | Status: DC | PRN
  Administered 2013-06-09 (×2): 30 mL via ORAL
  Filled 2013-06-09: qty 30

## 2013-06-09 NOTE — Evaluation (Signed)
Occupational Therapy Evaluation Patient Details Name: Keith Oconnell MRN: [id number] DOB: 09/19/1951 Today's Date: 06/09/2013 Time: 4098-1191 OT Time Calculation (min): 27 min  OT Assessment / Plan / Recommendation History of present illness admitted for L RCR   Clinical Impression   Pt was admitted for L RCR.  All education was completed.  Pt will follow up with Dr Darrelyn Hillock for further rehab.pain    OT Assessment  Progress rehab of shoulder as ordered by MD at follow-up appointment    Follow Up Recommendations   (follow up with dr Darrelyn Hillock)    Barriers to Discharge      Equipment Recommendations  None recommended by OT    Recommendations for Other Services    Frequency       Precautions / Restrictions Precautions Precautions: Shoulder Type of Shoulder Precautions: sling on at all times x bathing/dressing Precaution Booklet Issued: Yes (comment) Restrictions Weight Bearing Restrictions: Yes LUE Weight Bearing: Non weight bearing   Pertinent Vitals/Pain 5/10 L shoulder during eval.  Repositioned and ice applied   ADL  Upper Body Bathing: Moderate assistance Where Assessed - Upper Body Bathing: Unsupported sitting Upper Body Dressing: Maximal assistance Where Assessed - Upper Body Dressing: Unsupported sitting Lower Body Dressing: Maximal assistance Where Assessed - Lower Body Dressing: Supported sit to stand Toilet Transfer: Simulated;Independent Toilet Transfer Method: Stand pivot (bed to chair) Transfers/Ambulation Related to ADLs: independent with bed mobility and transfers ADL Comments: Wife present and donned sling with min cues. Pt had R RCR 2 years ago but had a different sling.  Reviewed shoulder protocol.  Pt/family verbalize all.  See education section of chart for details    OT Diagnosis:    OT Problem List:   OT Treatment Interventions:     OT Goals(Current goals can be found in the care plan section)    Visit Information  Last OT Received On:  06/09/13 Assistance Needed: +1 History of Present Illness: admitted for L RCR       Prior Functioning     Home Living Family/patient expects to be discharged to:: Private residence Living Arrangements: Spouse/significant other Prior Function Level of Independence: Independent Communication Communication: No difficulties Dominant Hand: Right         Vision/Perception     Cognition  Cognition Arousal/Alertness: Awake/alert Behavior During Therapy: WFL for tasks assessed/performed Overall Cognitive Status: Within Functional Limits for tasks assessed    Extremity/Trunk Assessment Upper Extremity Assessment Upper Extremity Assessment: LUE deficits/detail LUE Deficits / Details: NT due to precautions; RUE WFLs--had RCR 2 years ago     Mobility Bed Mobility Bed Mobility: Supine to Sit Supine to Sit: 7: Independent Transfers Transfers: Sit to Stand Sit to Stand: 7: Independent     Exercise     Balance     End of Session OT - End of Session Activity Tolerance: Patient tolerated treatment well Patient left: in chair;with call bell/phone within reach;with family/visitor present  GO Functional Assessment Tool Used: clinical observation/judgment Functional Limitation: Self care Self Care Current Status (Y7829): At least 60 percent but less than 80 percent impaired, limited or restricted Self Care Goal Status (F6213): At least 60 percent but less than 80 percent impaired, limited or restricted Self Care Discharge Status [phone]): At least 60 percent but less than 80 percent impaired, limited or restricted   Daveyon Kitchings 06/09/2013, 12:29 PM Marica Otter, OTR/L [phone] 06/09/2013

## 2013-06-09 NOTE — Progress Notes (Signed)
Subjective: 1 Day Post-Op Procedure(s) (LRB): OPEN LEFT SHOULDER ROTATOR CUFF REPAIR , complex repair with 3 anchors and graft (Left) Patient reports pain as 5 on 0-10 scale. Doing well except for Reflux.   Objective: Vital signs in last 24 hours: Temp:  [97.4 F (36.3 C)-98.2 F (36.8 C)] 97.7 F (36.5 C) (07/10 0550) Pulse Rate:  [57-87] 63 (07/10 0550) Resp:  [18-21] 20 (07/10 0550) BP: (130-165)/(63-84) 162/73 mmHg (07/10 0550) SpO2:  [96 %-100 %] 98 % (07/10 0550) Weight:  [90.719 kg (200 lb)] 90.719 kg (200 lb) (07/09 1145)  Intake/Output from previous day: 07/09 0701 - 07/10 0700 In: 2851.2 [I.V.:2701.2; IV Piggyback:150] Out: 1075 [Urine:1075] Intake/Output this shift:    No results found for this basename: HGB,  in the last 72 hours No results found for this basename: WBC, RBC, HCT, PLT,  in the last 72 hours No results found for this basename: NA, K, CL, CO2, BUN, CREATININE, GLUCOSE, CALCIUM,  in the last 72 hours No results found for this basename: LABPT, INR,  in the last 72 hours  Neurovascular intact  Assessment/Plan: 1 Day Post-Op Procedure(s) (LRB): OPEN LEFT SHOULDER ROTATOR CUFF REPAIR , complex repair with 3 anchors and graft (Left) Discharge home with home health  Keith Oconnell A 06/09/2013, 9:36 AM

## 2013-06-09 NOTE — Progress Notes (Signed)
Utilization review completed.  

## 2013-06-09 NOTE — Progress Notes (Signed)
   Subjective: 1 Day Post-Op Procedure(s) (LRB): OPEN LEFT SHOULDER ROTATOR CUFF REPAIR , complex repair with 3 anchors and graft (Left) Patient reports pain as moderate.   Patient seen in rounds without Dr. Darrelyn Hillock. Patient is having pain in the left shoulder. He was able to get only a few hours of sleep last night due to the discomfort. he is feeling somewhat better this morning. He denies numbness and tingling. No SOB or chest pain. He is prepared for discharge home today. Plan is to go Home after hospital stay.  Objective: Vital signs in last 24 hours: Temp:  [97.4 F (36.3 C)-98.2 F (36.8 C)] 97.7 F (36.5 C) (07/10 0550) Pulse Rate:  [57-87] 63 (07/10 0550) Resp:  [18-21] 20 (07/10 0550) BP: (130-165)/(63-84) 162/73 mmHg (07/10 0550) SpO2:  [96 %-100 %] 98 % (07/10 0550) Weight:  [90.719 kg (200 lb)] 90.719 kg (200 lb) (07/09 1145)  Intake/Output from previous day:  Intake/Output Summary (Last 24 hours) at 06/09/13 0930 Last data filed at 06/09/13 0700  Gross per 24 hour  Intake 2851.16 ml  Output   1075 ml  Net 1776.16 ml     EXAM General - Patient is Alert and Oriented Extremity - Neurologically intact Neurovascular intact Intact pulses distally Dressing/Incision - clean, dry, no drainage Motor Function - intact, moving hand and fingers well on exam.   Past Medical History  Diagnosis Date  . Hypertension   . Arthritis   . Hyperlipidemia   . GERD (gastroesophageal reflux disease)   . History of blood transfusion   . Psoriasis   . History of elevated glucose     per pt- pre diabetic    Assessment/Plan: 1 Day Post-Op Procedure(s) (LRB): OPEN LEFT SHOULDER ROTATOR CUFF REPAIR , complex repair with 3 anchors and graft (Left) S/p left shoulder rotator cuff repair  Estimated body mass index is 27.12 kg/(m^2) as calculated from the following:   Height as of this encounter: 6' (1.829 m).   Weight as of this encounter: 90.719 kg (200 lb). Advance diet D/C IV  fluids Discharge home  Wear sling at all times   He is doing fair this morning. He is having some pain post op as to be expected. Planning on discharge home today after OT. Discharge instructions given. Follow up in office in 10 days.   Nickol Collister LAUREN 06/09/2013, 9:30 AM

## 2013-06-13 NOTE — Discharge Summary (Signed)
Physician Discharge Summary   Patient ID: Keith Oconnell MRN: [id number] DOB/AGE: Oct 14, 1951 62 y.o.  Admit date: 06/08/2013 Discharge date: 06/09/2013  Primary Diagnosis: Complex tear of the right rotator cuff   Admission Diagnoses:  Past Medical History  Diagnosis Date  . Hypertension   . Arthritis   . Hyperlipidemia   . GERD (gastroesophageal reflux disease)   . History of blood transfusion   . Psoriasis   . History of elevated glucose     per pt- pre diabetic   Discharge Diagnoses:   Right shoulder rotator cuff tear S/P complex repair of right rotator cuff  Estimated body mass index is 27.12 kg/(m^2) as calculated from the following:   Height as of this encounter: 6' (1.829 m).   Weight as of this encounter: 90.719 kg (200 lb).  Procedure:  Procedure(s) (LRB): OPEN LEFT SHOULDER ROTATOR CUFF REPAIR , complex repair with 3 anchors and graft (Left)   Consults: None  HPI: The patient is a 62 year old male who presents with shoulder complaints. They are left handed and present today reporting pain, catching, pain with overhead motions, pain with throwing, pain with reaching, pain with lifting and tightness at the left shoulder and left superior shoulder that began 2 months ago. The patient reports that the shoulder symptoms began following a specific injury. The injury resulted from a fall onto the shoulder. The onset of symptoms was sudden. The patient reports symptoms which include shoulder pain, catching and decreased range of motion. The patient reports symptoms that radiate to the left upper arm. The patient describes these symptoms as severe and worsening. His MRI shows a 4.2 cm. full thickness supraspinatus and infraspinatus tear with retraction of about 3 cm.  [facility] Outpatient Visit on 06/02/2013  Component Date Value Range Status  . aPTT 06/02/2013 28  24 - 37 seconds Final  . Sodium 06/02/2013 139  135 - 145 mEq/L Final  . Potassium 06/02/2013 4.0  3.5 -  5.1 mEq/L Final  . Chloride 06/02/2013 104  96 - 112 mEq/L Final  . CO2 06/02/2013 29  19 - 32 mEq/L Final  . Glucose, Bld 06/02/2013 132* 70 - 99 mg/dL Final  . BUN 40/98/1191 17  6 - 23 mg/dL Final  . Creatinine, Ser 06/02/2013 1.46* 0.50 - 1.35 mg/dL Final  . Calcium 47/82/9562 10.5  8.4 - 10.5 mg/dL Final  . Total Protein 06/02/2013 7.6  6.0 - 8.3 g/dL Final  . Albumin 13/07/6577 4.2  3.5 - 5.2 g/dL Final  . AST 46/96/2952 40* 0 - 37 U/L Final  . ALT 06/02/2013 45  0 - 53 U/L Final  . Alkaline Phosphatase 06/02/2013 31* 39 - 117 U/L Final  . Total Bilirubin 06/02/2013 0.5  0.3 - 1.2 mg/dL Final  . GFR calc non Af Amer 06/02/2013 50* >90 mL/min Final  . GFR calc Af Amer 06/02/2013 58* >90 mL/min Final   Comment:                                 The eGFR has been calculated                          using the CKD EPI equation.                          This calculation has not been  validated in all clinical                          situations.                          eGFR's persistently                          <90 mL/min signify                          possible Chronic Kidney Disease.  Marland Kitchen Prothrombin Time 06/02/2013 13.3  11.6 - 15.2 seconds Final  . INR 06/02/2013 1.03  0.00 - 1.49 Final  . Color, Urine 06/02/2013 YELLOW  YELLOW Final  . APPearance 06/02/2013 TURBID* CLEAR Final  . Specific Gravity, Urine 06/02/2013 1.019  1.005 - 1.030 Final  . pH 06/02/2013 7.5  5.0 - 8.0 Final  . Glucose, UA 06/02/2013 NEGATIVE  NEGATIVE mg/dL Final  . Hgb urine dipstick 06/02/2013 NEGATIVE  NEGATIVE Final  . Bilirubin Urine 06/02/2013 NEGATIVE  NEGATIVE Final  . Ketones, ur 06/02/2013 NEGATIVE  NEGATIVE mg/dL Final  . Protein, ur 16/09/9603 NEGATIVE  NEGATIVE mg/dL Final  . Urobilinogen, UA 06/02/2013 1.0  0.0 - 1.0 mg/dL Final  . Nitrite 54/08/8118 NEGATIVE  NEGATIVE Final  . Leukocytes, UA 06/02/2013 NEGATIVE  NEGATIVE Final  . WBC 06/02/2013 6.0  4.0 - 10.5 K/uL  Final  . RBC 06/02/2013 4.49  4.22 - 5.81 MIL/uL Final  . Hemoglobin 06/02/2013 13.9  13.0 - 17.0 g/dL Final  . HCT 14/78/2956 40.5  39.0 - 52.0 % Final  . MCV 06/02/2013 90.2  78.0 - 100.0 fL Final  . MCH 06/02/2013 31.0  26.0 - 34.0 pg Final  . MCHC 06/02/2013 34.3  30.0 - 36.0 g/dL Final  . RDW 21/30/8657 13.2  11.5 - 15.5 % Final  . Platelets 06/02/2013 245  150 - 400 K/uL Final  . Squamous Epithelial / LPF 06/02/2013 RARE  RARE Final  . WBC, UA 06/02/2013 0-2  <3 WBC/hpf Final  . Bacteria, UA 06/02/2013 FEW* RARE Final  . Urine-Other 06/02/2013 AMORPHOUS URATES/PHOSPHATES   Final     X-Rays:Dg Chest 2 View  06/02/2013   *RADIOLOGY REPORT*  Clinical Data: Preop chest radiograph for a rotator cuff tear. Hypertension.  CHEST - 2 VIEW  Comparison: 10/07/2011  Findings: Increased that has herniated through the esophageal hiatus, stable.  The mediastinum is otherwise unremarkable.  The cardiac silhouette is normal in size and configuration.  No hilar masses.  Clear lungs.  The bony thorax is intact.  There has been no change.  IMPRESSION: No active disease of the chest.   Original Report Authenticated By: Amie Portland, M.D.   Nm Bone Scan 3 Phase Upper Extremity  05/30/2013   *RADIOLOGY REPORT*  Clinical Data: Wrist pain since a fall 04/04/2013.  NUCLEAR MEDICINE THREE PHASE BONE SCAN  Technique:  Radionuclide angiographic images, immediate static blood pool images, and 3-hour delayed static images were obtained after intravenous injection of radiopharmaceutical.  Radiopharmaceutical: CURIE TC-MDP TECHNETIUM TC 36M MEDRONATE IV KIT  Comparison: MRI of the left wrist 05/11/2013.  Findings: Blood flow images demonstrate symmetric activity.  On the blood pool and delayed imaging, there is a small focus of increased activity in the radial styloid.  Also seen is mildly increased activity in the medial aspect of  the carpus, likely in the lunate. Activity is otherwise unremarkable.  IMPRESSION:  1.   Punctate focus of increased the tip in the distal radius could be due to a tiny bone contusion or degenerative disease. 2.  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   Original Report Authenticated By: Holley Dexter, M.D.    EKG: Orders placed during the hospital encounter of 04/07/13  . EKG 12-LEAD  . EKG 12-LEAD  . ED EKG  . ED EKG  . EKG     Hospital Course: COREY LASKI is a 62 y.o. who was admitted to [facility]. They were brought to the operating room on 06/08/2013 and underwent Procedure(s): OPEN LEFT SHOULDER ROTATOR CUFF REPAIR , complex repair with 3 anchors and graft.  Patient tolerated the procedure well and was later transferred to the recovery room and then to the orthopaedic floor for postoperative care.  They were given PO and IV analgesics for pain control following their surgery.  They were given 24 hours of postoperative antibiotics of  Anti-infectives   Start     Dose/Rate Route Frequency Ordered Stop   06/08/13 1400  ceFAZolin (ANCEF) IVPB 1 g/50 mL premix     1 g 100 mL/hr over 30 Minutes Intravenous Every 6 hours 06/08/13 1133 06/09/13 0231   06/08/13 0918  polymyxin B 500,000 Units, bacitracin 50,000 Units in sodium chloride irrigation 0.9 % 500 mL irrigation  Status:  Discontinued       As needed 06/08/13 0918 06/08/13 1025   06/08/13 0628  ceFAZolin (ANCEF) IVPB 2 g/50 mL premix     2 g 100 mL/hr over 30 Minutes Intravenous On call to O.R. 06/08/13 9563 06/08/13 0815    OT was ordered for sling instructions.  Discharge planning consulted to help with postop disposition and equipment needs.  Patient had a decent night on the evening of surgery even though he had quite a bit of difficulty with pain. Patient was seen in rounds and was ready to go home.   Discharge Medications: Prior to Admission medications   Medication Sig Start Date End Date Taking? Authorizing Provider  amLODipine (NORVASC) 5  MG tablet Take 5 mg by mouth daily with breakfast.    Yes Historical Provider, MD  fenofibrate micronized (ANTARA) 130 MG capsule Take 130 mg by mouth daily before breakfast.    Yes Historical Provider, MD  halobetasol (ULTRAVATE) 0.05 % cream Apply 1 application topically daily as needed (for psorisis).   Yes Historical Provider, MD  ibuprofen (ADVIL,MOTRIN) 200 MG tablet Take 200-600 mg by mouth every 6 (six) hours as needed for pain.   Yes Historical Provider, MD  lisinopril-hydrochlorothiazide (PRINZIDE,ZESTORETIC) 20-25 MG per tablet Take 1 tablet by mouth daily with breakfast.    Yes Historical Provider, MD  simvastatin (ZOCOR) 20 MG tablet Take 20 mg by mouth every evening.   Yes Historical Provider, MD  aspirin EC 325 MG tablet Take 325 mg by mouth daily.     Historical Provider, MD  Coenzyme Q10 (CO Q 10 PO) Take 1 tablet by mouth daily.    Historical Provider, MD  Garlic 1000 MG CAPS Take 1 capsule by mouth daily.     Historical Provider, MD  methocarbamol (ROBAXIN) 500 MG tablet Take 1 tablet (500 mg total) by mouth every 6 (six) hours as needed. 06/09/13   Fenna Semel Tamala Ser, PA-C  Multiple Vitamins-Minerals (MULTIVITAMINS THER. W/MINERALS) TABS Take 1 tablet by mouth  daily.     Historical Provider, MD  omega-3 acid ethyl esters (LOVAZA) 1 G capsule Take 1-2 g by mouth 2 (two) times daily. 1 CAPSULES IN THE MORNING AND 2 CAPSULES IN THE EVENING    Historical Provider, MD  omeprazole (PRILOSEC OTC) 20 MG tablet Take 20 mg by mouth daily as needed (for reflux).    Historical Provider, MD  oxyCODONE-acetaminophen (PERCOCET/ROXICET) 5-325 MG per tablet Take 1-2 tablets by mouth every 4 (four) hours as needed. 06/09/13   Jannie Doyle Tamala Ser, PA-C    Diet: Cardiac diet Activity:Wear sling at all times Follow-up:in 10 days Disposition - Home Discharged Condition: fair   Discharge Orders   Future Orders Complete By Expires     Call MD / Call 911  As directed     Comments:      If  you experience chest pain or shortness of breath, CALL 911 and be transported to the hospital emergency room.  If you develope a fever above 101 F, pus (white drainage) or increased drainage or redness at the wound, or calf pain, call your surgeon's office.    Constipation Prevention  As directed     Comments:      Drink plenty of fluids.  Prune juice may be helpful.  You may use a stool softener, such as Colace (over the counter) 100 mg twice a day.  Use MiraLax (over the counter) for constipation as needed.    Diet - low sodium heart healthy  As directed     Discharge instructions  As directed     Comments:      Keep your sling on at all times, including sleeping in your sling. The only time you should remove your sling is to shower only but you need to keep your hand against your chest while you shower.  Call Dr. Darrelyn Hillock if any wound complications or temperature of 101 degrees F or over.  Call the office for an appointment to see Dr. Darrelyn Hillock in ten days: [phone] and ask for Dr. Jeannetta Ellis nurse, Mackey Birchwood.  Do not remove your dressing unless there is excess drainage    Driving restrictions  As directed     Comments:      No driving    Increase activity slowly as tolerated  As directed     Lifting restrictions  As directed     Comments:      No lifting        Medication List         amLODipine 5 MG tablet  Commonly known as:  NORVASC  Take 5 mg by mouth daily with breakfast.     aspirin EC 325 MG tablet  Take 325 mg by mouth daily.     CO Q 10 PO  Take 1 tablet by mouth daily.     fenofibrate micronized 130 MG capsule  Commonly known as:  ANTARA  Take 130 mg by mouth daily before breakfast.     Garlic 1000 MG Caps  Take 1 capsule by mouth daily.     halobetasol 0.05 % cream  Commonly known as:  ULTRAVATE  Apply 1 application topically daily as needed (for psorisis).     ibuprofen 200 MG tablet  Commonly known as:  ADVIL,MOTRIN  Take 200-600 mg by mouth every  6 (six) hours as needed for pain.     lisinopril-hydrochlorothiazide 20-25 MG per tablet  Commonly known as:  PRINZIDE,ZESTORETIC  Take 1 tablet by mouth daily with breakfast.  methocarbamol 500 MG tablet  Commonly known as:  ROBAXIN  Take 1 tablet (500 mg total) by mouth every 6 (six) hours as needed.     multivitamins ther. w/minerals Tabs  Take 1 tablet by mouth daily.     omega-3 acid ethyl esters 1 G capsule  Commonly known as:  LOVAZA  Take 1-2 g by mouth 2 (two) times daily. 1 CAPSULES IN THE MORNING AND 2 CAPSULES IN THE EVENING     omeprazole 20 MG tablet  Commonly known as:  PRILOSEC OTC  Take 20 mg by mouth daily as needed (for reflux).     oxyCODONE-acetaminophen 5-325 MG per tablet  Commonly known as:  PERCOCET/ROXICET  Take 1-2 tablets by mouth every 4 (four) hours as needed.     simvastatin 20 MG tablet  Commonly known as:  ZOCOR  Take 20 mg by mouth every evening.           Follow-up Information   Follow up with GIOFFRE,RONALD A, MD. Schedule an appointment as soon as possible for a visit in 10 days.   Contact information:   [address] [place] [address] [phone]       Signed: Kerby Nora 06/13/2013, 8:58 AM

## 2013-10-03 ENCOUNTER — Other Ambulatory Visit: Payer: Self-pay | Admitting: Orthopedic Surgery

## 2013-10-04 ENCOUNTER — Encounter: Payer: Self-pay | Admitting: Gastroenterology

## 2013-10-06 ENCOUNTER — Encounter (HOSPITAL_BASED_OUTPATIENT_CLINIC_OR_DEPARTMENT_OTHER): Payer: Self-pay | Admitting: *Deleted

## 2013-10-06 NOTE — Progress Notes (Signed)
To come in for bmet 

## 2013-10-07 ENCOUNTER — Encounter (HOSPITAL_BASED_OUTPATIENT_CLINIC_OR_DEPARTMENT_OTHER): Payer: Worker's Compensation | Attending: Orthopedic Surgery

## 2013-10-07 LAB — BASIC METABOLIC PANEL
BUN: 16 mg/dL (ref 6–23)
Calcium: 9.9 mg/dL (ref 8.4–10.5)
Creatinine, Ser: 1.24 mg/dL (ref 0.50–1.35)
GFR calc Af Amer: 70 mL/min — ABNORMAL LOW (ref >90)
GFR calc non Af Amer: 61 mL/min — ABNORMAL LOW (ref >90)
Glucose, Bld: 134 mg/dL — ABNORMAL HIGH (ref 70–99)

## 2013-10-11 ENCOUNTER — Encounter (HOSPITAL_BASED_OUTPATIENT_CLINIC_OR_DEPARTMENT_OTHER): Payer: Worker's Compensation | Admitting: Certified Registered Nurse Anesthetist

## 2013-10-11 ENCOUNTER — Ambulatory Visit (HOSPITAL_BASED_OUTPATIENT_CLINIC_OR_DEPARTMENT_OTHER)
Admission: RE | Admit: 2013-10-11 | Discharge: 2013-10-11 | Disposition: A | Discharge status: Home / Self Care | Payer: Worker's Compensation | Source: Ambulatory Visit | Attending: Orthopedic Surgery | Admitting: Orthopedic Surgery

## 2013-10-11 ENCOUNTER — Encounter (HOSPITAL_BASED_OUTPATIENT_CLINIC_OR_DEPARTMENT_OTHER): Payer: Self-pay | Admitting: Orthopedic Surgery

## 2013-10-11 ENCOUNTER — Ambulatory Visit (HOSPITAL_BASED_OUTPATIENT_CLINIC_OR_DEPARTMENT_OTHER): Payer: Worker's Compensation | Admitting: Certified Registered Nurse Anesthetist

## 2013-10-11 ENCOUNTER — Encounter (HOSPITAL_BASED_OUTPATIENT_CLINIC_OR_DEPARTMENT_OTHER)
Admission: RE | Disposition: A | Discharge status: Home / Self Care | Payer: Self-pay | Source: Ambulatory Visit | Attending: Orthopedic Surgery

## 2013-10-11 DIAGNOSIS — R7309 Other abnormal glucose: Secondary | ICD-10-CM | POA: Insufficient documentation

## 2013-10-11 DIAGNOSIS — Z885 Allergy status to narcotic agent status: Secondary | ICD-10-CM | POA: Insufficient documentation

## 2013-10-11 DIAGNOSIS — M6580 Other synovitis and tenosynovitis, unspecified site: Secondary | ICD-10-CM | POA: Insufficient documentation

## 2013-10-11 DIAGNOSIS — E785 Hyperlipidemia, unspecified: Secondary | ICD-10-CM | POA: Insufficient documentation

## 2013-10-11 DIAGNOSIS — K219 Gastro-esophageal reflux disease without esophagitis: Secondary | ICD-10-CM | POA: Insufficient documentation

## 2013-10-11 DIAGNOSIS — IMO0001 Reserved for inherently not codable concepts without codable children: Secondary | ICD-10-CM | POA: Insufficient documentation

## 2013-10-11 DIAGNOSIS — M1990 Unspecified osteoarthritis, unspecified site: Secondary | ICD-10-CM | POA: Insufficient documentation

## 2013-10-11 DIAGNOSIS — I10 Essential (primary) hypertension: Secondary | ICD-10-CM | POA: Insufficient documentation

## 2013-10-11 HISTORY — PX: WRIST ARTHROSCOPY: SHX838

## 2013-10-11 LAB — POCT HEMOGLOBIN-HEMACUE: Hemoglobin: 14.6 g/dL (ref 13.0–17.0)

## 2013-10-11 SURGERY — ARTHROSCOPY, WRIST
Anesthesia: Regional | Site: Wrist | Laterality: Left | Wound class: Clean

## 2013-10-11 MED ORDER — ONDANSETRON HCL 4 MG/2ML IJ SOLN
INTRAMUSCULAR | Status: DC | PRN
  Administered 2013-10-11: 4 mg via INTRAVENOUS

## 2013-10-11 MED ORDER — FENTANYL CITRATE 0.05 MG/ML IJ SOLN
50.0000 ug | INTRAMUSCULAR | Status: DC | PRN
  Administered 2013-10-11: 100 ug via INTRAVENOUS

## 2013-10-11 MED ORDER — MIDAZOLAM HCL 2 MG/2ML IJ SOLN
1.0000 mg | INTRAMUSCULAR | Status: DC | PRN
  Administered 2013-10-11: 2 mg via INTRAVENOUS

## 2013-10-11 MED ORDER — OXYCODONE HCL 5 MG/5ML PO SOLN: 5.0000 mg | Freq: Once | ORAL | Status: DC | PRN

## 2013-10-11 MED ORDER — BUPIVACAINE-EPINEPHRINE PF 0.5-1:200000 % IJ SOLN
INTRAMUSCULAR | Status: DC | PRN
  Administered 2013-10-11: 29 mL

## 2013-10-11 MED ORDER — CHLORHEXIDINE GLUCONATE 4 % EX LIQD: 60.0000 mL | Freq: Once | CUTANEOUS | Status: DC

## 2013-10-11 MED ORDER — ONDANSETRON HCL 4 MG/2ML IJ SOLN: 4.0000 mg | Freq: Once | INTRAMUSCULAR | Status: DC | PRN

## 2013-10-11 MED ORDER — MIDAZOLAM HCL 2 MG/2ML IJ SOLN
INTRAMUSCULAR | Status: AC
  Filled 2013-10-11: qty 2

## 2013-10-11 MED ORDER — PROPOFOL 10 MG/ML IV BOLUS
INTRAVENOUS | Status: DC | PRN
  Administered 2013-10-11: 200 mg via INTRAVENOUS

## 2013-10-11 MED ORDER — HYDROMORPHONE HCL PF 1 MG/ML IJ SOLN
INTRAMUSCULAR | Status: AC
  Filled 2013-10-11: qty 1

## 2013-10-11 MED ORDER — LACTATED RINGERS IV SOLN
INTRAVENOUS | Status: DC
  Administered 2013-10-11 (×2): via INTRAVENOUS

## 2013-10-11 MED ORDER — CEFAZOLIN SODIUM-DEXTROSE 2-3 GM-% IV SOLR
2.0000 g | INTRAVENOUS | Status: AC
  Administered 2013-10-11: 2 g via INTRAVENOUS

## 2013-10-11 MED ORDER — FENTANYL CITRATE 0.05 MG/ML IJ SOLN
INTRAMUSCULAR | Status: AC
  Filled 2013-10-11: qty 2

## 2013-10-11 MED ORDER — OXYCODONE HCL 5 MG PO TABS: 5.0000 mg | ORAL_TABLET | Freq: Once | ORAL | Status: DC | PRN

## 2013-10-11 MED ORDER — EPHEDRINE SULFATE 50 MG/ML IJ SOLN
INTRAMUSCULAR | Status: DC | PRN
  Administered 2013-10-11 (×2): 10 mg via INTRAVENOUS

## 2013-10-11 MED ORDER — SODIUM CHLORIDE 0.9 % IR SOLN
Status: DC | PRN
  Administered 2013-10-11: 1000 mL

## 2013-10-11 MED ORDER — LIDOCAINE HCL (CARDIAC) 20 MG/ML IV SOLN
INTRAVENOUS | Status: DC | PRN
  Administered 2013-10-11: 30 mg via INTRAVENOUS

## 2013-10-11 MED ORDER — HYDROMORPHONE HCL PF 1 MG/ML IJ SOLN
0.2500 mg | INTRAMUSCULAR | Status: DC | PRN
  Administered 2013-10-11 (×2): 0.5 mg via INTRAVENOUS

## 2013-10-11 MED ORDER — FENTANYL CITRATE 0.05 MG/ML IJ SOLN
INTRAMUSCULAR | Status: DC | PRN
  Administered 2013-10-11 (×2): 50 ug via INTRAVENOUS

## 2013-10-11 MED ORDER — BUPIVACAINE HCL (PF) 0.25 % IJ SOLN
INTRAMUSCULAR | Status: AC
  Filled 2013-10-11: qty 30

## 2013-10-11 MED ORDER — OXYCODONE-ACETAMINOPHEN 10-325 MG PO TABS: 1.0000 | ORAL_TABLET | ORAL | Status: DC | PRN

## 2013-10-11 MED ORDER — FENTANYL CITRATE 0.05 MG/ML IJ SOLN
INTRAMUSCULAR | Status: AC
  Filled 2013-10-11: qty 6

## 2013-10-11 MED ORDER — LACTATED RINGERS IV SOLN: INTRAVENOUS | Status: DC | PRN

## 2013-10-11 MED ORDER — CEFAZOLIN SODIUM 1-5 GM-% IV SOLN
INTRAVENOUS | Status: AC
  Filled 2013-10-11: qty 100

## 2013-10-11 MED ORDER — MIDAZOLAM HCL 5 MG/5ML IJ SOLN
INTRAMUSCULAR | Status: DC | PRN
  Administered 2013-10-11: 1 mg via INTRAVENOUS

## 2013-10-11 MED ORDER — DEXAMETHASONE SODIUM PHOSPHATE 10 MG/ML IJ SOLN
INTRAMUSCULAR | Status: DC | PRN
  Administered 2013-10-11: 5 mg via INTRAVENOUS

## 2013-10-11 SURGICAL SUPPLY — 83 items
BANDAGE GAUZE ELAST BULKY 4 IN (GAUZE/BANDAGES/DRESSINGS) ×2 IMPLANT
BLADE CUDA 2.0 (BLADE) IMPLANT
BLADE EAR TYMPAN 2.5 60D BEAV (BLADE) IMPLANT
BLADE MINI RND TIP GREEN BEAV (BLADE) IMPLANT
BLADE SURG 15 STRL LF DISP TIS (BLADE) ×1 IMPLANT
BLADE SURG 15 STRL SS (BLADE) ×2
BNDG CMPR 9X4 STRL LF SNTH (GAUZE/BANDAGES/DRESSINGS) ×1
BNDG COHESIVE 3X5 TAN STRL LF (GAUZE/BANDAGES/DRESSINGS) ×2 IMPLANT
BNDG ESMARK 4X9 LF (GAUZE/BANDAGES/DRESSINGS) ×1 IMPLANT
BUR CUDA 2.9 (BURR) IMPLANT
BUR FULL RADIUS 2.0 (BURR) IMPLANT
BUR FULL RADIUS 2.9 (BURR) ×1 IMPLANT
BUR GATOR 2.9 (BURR) IMPLANT
BUR SPHERICAL 2.9 (BURR) IMPLANT
CANISTER SUCT 1200ML W/VALVE (MISCELLANEOUS) IMPLANT
CANISTER SUCT 3000ML (MISCELLANEOUS) IMPLANT
CANISTER SUCT LVC 12 LTR MEDI- (MISCELLANEOUS) IMPLANT
CHLORAPREP W/TINT 26ML (MISCELLANEOUS) ×2 IMPLANT
CORDS BIPOLAR (ELECTRODE) IMPLANT
COVER MAYO STAND STRL (DRAPES) ×2 IMPLANT
COVER TABLE BACK 60X90 (DRAPES) ×2 IMPLANT
CUFF TOURNIQUET SINGLE 18IN (TOURNIQUET CUFF) ×1 IMPLANT
DRAPE EXTREMITY T 121X128X90 (DRAPE) ×2 IMPLANT
DRAPE OEC MINIVIEW 54X84 (DRAPES) IMPLANT
DRAPE SURG 17X23 STRL (DRAPES) ×2 IMPLANT
DRAPE U 20/CS (DRAPES) ×2 IMPLANT
DRSG KUZMA FLUFF (GAUZE/BANDAGES/DRESSINGS) IMPLANT
ELECT SMALL JOINT 90D BASC (ELECTRODE) IMPLANT
GAUZE XEROFORM 1X8 LF (GAUZE/BANDAGES/DRESSINGS) ×2 IMPLANT
GLOVE BIO SURGEON STRL SZ 6.5 (GLOVE) ×1 IMPLANT
GLOVE BIO SURGEON STRL SZ7.5 (GLOVE) ×1 IMPLANT
GLOVE BIOGEL PI IND STRL 7.0 (GLOVE) IMPLANT
GLOVE BIOGEL PI IND STRL 8 (GLOVE) IMPLANT
GLOVE BIOGEL PI IND STRL 8.5 (GLOVE) ×1 IMPLANT
GLOVE BIOGEL PI INDICATOR 7.0 (GLOVE) ×1
GLOVE BIOGEL PI INDICATOR 8 (GLOVE) ×1
GLOVE BIOGEL PI INDICATOR 8.5 (GLOVE) ×1
GLOVE ECLIPSE 6.5 STRL STRAW (GLOVE) ×1 IMPLANT
GLOVE EXAM NITRILE MD LF STRL (GLOVE) ×1 IMPLANT
GLOVE SURG ORTHO 8.0 STRL STRW (GLOVE) ×2 IMPLANT
GOWN BRE IMP PREV XXLGXLNG (GOWN DISPOSABLE) ×3 IMPLANT
GOWN PREVENTION PLUS XLARGE (GOWN DISPOSABLE) ×3 IMPLANT
IV NS IRRIG 3000ML ARTHROMATIC (IV SOLUTION) ×2 IMPLANT
NDL EPIDURAL TUOHY 20GX3.5 (NEEDLE) IMPLANT
NDL SAFETY ECLIPSE 18X1.5 (NEEDLE) ×3 IMPLANT
NDL SPNL 18GX3.5 QUINCKE PK (NEEDLE) IMPLANT
NEEDLE HYPO 18GX1.5 SHARP (NEEDLE) ×4
NEEDLE HYPO 22GX1.5 SAFETY (NEEDLE) ×2 IMPLANT
NEEDLE SPNL 18GX3.5 QUINCKE PK (NEEDLE) IMPLANT
NEEDLE TUOHY 20GX3.5 (NEEDLE) IMPLANT
NS IRRIG 1000ML POUR BTL (IV SOLUTION) IMPLANT
PACK BASIN DAY SURGERY FS (CUSTOM PROCEDURE TRAY) ×2 IMPLANT
PAD CAST 3X4 CTTN HI CHSV (CAST SUPPLIES) ×1 IMPLANT
PADDING CAST ABS 3INX4YD NS (CAST SUPPLIES) ×1
PADDING CAST ABS 4INX4YD NS (CAST SUPPLIES)
PADDING CAST ABS COTTON 3X4 (CAST SUPPLIES) ×1 IMPLANT
PADDING CAST ABS COTTON 4X4 ST (CAST SUPPLIES) ×1 IMPLANT
PADDING CAST COTTON 3X4 STRL (CAST SUPPLIES) ×2
ROUTER HOODED VORTEX 2.9MM (BLADE) IMPLANT
SET ARTHROSCOPY TUBING (MISCELLANEOUS) ×2
SET ARTHROSCOPY TUBING LN (MISCELLANEOUS) IMPLANT
SET EXT MALE ROTATING LL 32IN (MISCELLANEOUS) ×2 IMPLANT
SET IV EXT TUBING FEMALE 31 (MISCELLANEOUS) ×1 IMPLANT
SET SM JOINT TUBING/CANN (CANNULA) IMPLANT
SLEEVE SCD COMPRESS KNEE MED (MISCELLANEOUS) ×1 IMPLANT
SPLINT PLASTER CAST XFAST 3X15 (CAST SUPPLIES) IMPLANT
SPLINT PLASTER XTRA FASTSET 3X (CAST SUPPLIES) ×30
SPONGE GAUZE 4X4 12PLY (GAUZE/BANDAGES/DRESSINGS) ×2 IMPLANT
STOCKINETTE 4X48 STRL (DRAPES) ×2 IMPLANT
SUCTION FRAZIER TIP 10 FR DISP (SUCTIONS) IMPLANT
SUT MERSILENE 4 0 P 3 (SUTURE) IMPLANT
SUT PDS AB 2-0 CT2 27 (SUTURE) IMPLANT
SUT STEEL 4 0 (SUTURE) IMPLANT
SUT VIC AB 2-0 PS2 27 (SUTURE) IMPLANT
SUT VICRYL 4-0 PS2 18IN ABS (SUTURE) IMPLANT
SUT VICRYL RAPID 5 0 P 3 (SUTURE) IMPLANT
SUT VICRYL RAPIDE 4/0 PS 2 (SUTURE) ×2 IMPLANT
SYR BULB 3OZ (MISCELLANEOUS) ×1 IMPLANT
SYR CONTROL 10ML LL (SYRINGE) ×2 IMPLANT
TUBE CONNECTING 20X1/4 (TUBING) ×1 IMPLANT
UNDERPAD 30X30 INCONTINENT (UNDERPADS AND DIAPERS) ×2 IMPLANT
WAND 1.5 MICROBLATOR (SURGICAL WAND) ×1 IMPLANT
WATER STERILE IRR 1000ML POUR (IV SOLUTION) ×2 IMPLANT

## 2013-10-11 NOTE — Transfer of Care (Signed)
Immediate Anesthesia Transfer of Care Note  Patient: Keith Oconnell  Procedure(s) Performed: Procedure(s): LEFT ARTHROSCOPY WRIST DEBRIDMENT/SHRINKAGE (Left)  Patient Location: PACU  Anesthesia Type:GA combined with regional for post-op pain  Level of Consciousness: awake, alert , oriented and patient cooperative  Airway & Oxygen Therapy: Patient Spontanous Breathing and Patient connected to face mask oxygen  Post-op Assessment: Report given to PACU RN and Post -op Vital signs reviewed and stable  Post vital signs: Reviewed and stable  Complications: No apparent anesthesia complications

## 2013-10-11 NOTE — Progress Notes (Signed)
Assisted Dr. Crews with left, ultrasound guided, interscalene  block. Side rails up, monitors on throughout procedure. See vital signs in flow sheet. Tolerated Procedure well. 

## 2013-10-11 NOTE — H&P (Signed)
Keith Oconnell is a 62 year old right hand dominant male who comes in complaining of pain in his left hand and wrist. He comes in for a consultation with respect to potential scaphoid fracture s/p fall on 04-04-13, approximately 3 feet. He was attempting to unlock a bolt and apparently this gave way or the wrench slipped. He suffered a fall. He thinks he landed on his outstretched left hand. He complains of pain in his hand and shoulder. This is localized in the radial aspect. He has had a CT scan done revealing degenerative changes only and no discrete fractures. He relates no prior history of injury. He does have a history of arthritis. He has no history of diabetes, thyroid problems or gout. He has been taking Percocet. He has been wearing a thumb spica splint. He complains of constant moderate, aching pain with a feeling of swelling. He states it has gotten somewhat better. It does awaken him at night. Activity makes it worse and rest makes it better. The injury occurred while he was at work at [employer]. He localizes the pain to the radial aspect of his wrist.  PAST MEDICAL HISTORY: He has no known drug allergies. He is on Lovaza, Simvastatin, Amlodipine, HCTZ, Lisinopril, Ramipril and Percocet. He has had back surgery, rotator cuff surgery and right wrist surgery.  FAMILY H ISTORY: Positive for diabetes, high BP and arthritis.  SOCIAL HISTORY: He does not smoke or drink. He is married. He is a Consulting civil engineer at [employer].  REVIEW OF SYSTEMS: Positive for hearing loss, ringing in his ears, high BP, rash, psoriasis, otherwise negative for 14 points.  Keith Oconnell is an 62 y.o. male.   Chief Complaint: Djd SLAC wrist left S/P left shoulder surgery  HPI: see above  Past Medical History  Diagnosis Date  . Hypertension   . Arthritis   . Hyperlipidemia   . GERD (gastroesophageal reflux disease)   . History of blood transfusion   . Psoriasis   . History of elevated glucose    per pt- pre diabetic    Past Surgical History  Procedure Laterality Date  . Dental surgery      pins placed upper front tooth  . Shoulder open rotator cuff repair  10/07/2011    Procedure: ROTATOR CUFF REPAIR SHOULDER OPEN;  Surgeon: Jacki Cones;  Location: [facility];  Service: Orthopedics;  Laterality: Right;  Open Rotator Cuff Repair/Acrominectomy with Graft and Anchors  . Wrist surgery Right     repair fractured wrist  . Shoulder open rotator cuff repair Left 06/08/2013    Procedure: OPEN LEFT SHOULDER ROTATOR CUFF REPAIR , complex repair with 3 anchors and graft;  Surgeon: Jacki Cones, MD;  Location: [facility];  Service: Orthopedics;  Laterality: Left;  . Back surgery  1995    lumbar  . Colonoscopy      History reviewed. No pertinent family history. Social History:  reports that he has never smoked. He has never used smokeless tobacco. He reports that he does not drink alcohol or use illicit drugs.  Allergies:  Allergies  Allergen Reactions  . Morphine And Related Nausea And Vomiting    Medications Prior to Admission  Medication Sig Dispense Refill  . amLODipine (NORVASC) 5 MG tablet Take 5 mg by mouth daily with breakfast.       . aspirin EC 325 MG tablet Take 325 mg by mouth daily.       . Coenzyme Q10 (CO Q 10  PO) Take 1 tablet by mouth daily.      . fenofibrate micronized (ANTARA) 130 MG capsule Take 130 mg by mouth daily before breakfast.       . Garlic 1000 MG CAPS Take 1 capsule by mouth daily.       . halobetasol (ULTRAVATE) 0.05 % cream Apply 1 application topically daily as needed (for psorisis).      Marland Kitchen ibuprofen (ADVIL,MOTRIN) 200 MG tablet Take 200-600 mg by mouth every 6 (six) hours as needed for pain.      Marland Kitchen lisinopril-hydrochlorothiazide (PRINZIDE,ZESTORETIC) 20-25 MG per tablet Take 1 tablet by mouth daily with breakfast.       . methocarbamol (ROBAXIN) 500 MG tablet Take 1 tablet (500 mg total) by mouth every 6 (six) hours as needed.  40 tablet  1  .  Multiple Vitamins-Minerals (MULTIVITAMINS THER. W/MINERALS) TABS Take 1 tablet by mouth daily.       Marland Kitchen omega-3 acid ethyl esters (LOVAZA) 1 G capsule Take 1-2 g by mouth 2 (two) times daily. 1 CAPSULES IN THE MORNING AND 2 CAPSULES IN THE EVENING      . omeprazole (PRILOSEC OTC) 20 MG tablet Take 20 mg by mouth daily as needed (for reflux).      Marland Kitchen oxyCODONE-acetaminophen (PERCOCET/ROXICET) 5-325 MG per tablet Take 1-2 tablets by mouth every 4 (four) hours as needed.  60 tablet  0  . simvastatin (ZOCOR) 20 MG tablet Take 20 mg by mouth every evening.        No results found for this or any previous visit (from the past 48 hour(s)).  No results found.   Pertinent items are noted in HPI.  Height 6' (1.829 m), weight 200 lb (90.719 kg).  General appearance: alert, cooperative and appears stated age Head: Normocephalic, without obvious abnormality Neck: no JVD Resp: clear to auscultation bilaterally Cardio: regular rate and rhythm, S1, S2 normal, no murmur, click, rub or gallop GI: soft, non-tender; bowel sounds normal; no masses,  no organomegaly Extremities: extremities normal, atraumatic, no cyanosis or edema Pulses: 2+ and symmetric Skin: Skin color, texture, turgor normal. No rashes or lesions Neurologic: Grossly normal Incision/Wound: na  Assessment/Plan X-rays reveal degenerative changes CMC joint of his thumb. There is no scaphoid fracture.  His CT scan is reviewed revealing degenerative changes at the [facility] joint of his thumb, degenerative changes at the distal radial ulnar joint, degenerative changes at the pisotriquetral joint.   Diagnosis: Degenerative arthritis with possible sprain and aggravation of the arthritis. We would recommend an arthroscopy of his wrist at the present time.  He has been given clearance by Dr. Darrelyn Hillock with respect to his shoulder. We would recommend an arthroscopy of his wrist with possible radial styloidectomy, debridement or shrinkage as dictated by  findings with respect to ligaments.    The pre, peri and postoperative course were discussed depending on exactly what is done, he may be in a splint for six weeks with no use of that hand for three months.  He is advised that further procedures may be necessary.  He would like to proceed.  He is scheduled for arthroscopy left wrist with possible radial styloidectomy, debridement, shrinkage as dictated by findings.  Zahir Eisenhour R 10/11/2013, 10:04 AM

## 2013-10-11 NOTE — Brief Op Note (Signed)
10/11/2013  12:41 PM  PATIENT:  [name]  62 y.o. male  PRE-OPERATIVE DIAGNOSIS:  LEFT WRIST SPRAIN AND DEGENRATIVE JOINT DISEASE  POST-OPERATIVE DIAGNOSIS:  LEFT WRIST SPRAIN AND DEGENRATIVE JOINT DISEASE  PROCEDURE:  Procedure(s): LEFT ARTHROSCOPY WRIST DEBRIDMENT/SHRINKAGE (Left)  SURGEON:  Surgeon(s) and Role:    * Nicki Reaper, MD - Primary  PHYSICIAN ASSISTANT:   ASSISTANTS: K Chundra Sauerwein<MD   ANESTHESIA:   regional and general  EBL:  Total I/O In: 1000 [I.V.:1000] Out: -   BLOOD ADMINISTERED:none  DRAINS: none   LOCAL MEDICATIONS USED:  NONE  SPECIMEN:  No Specimen  DISPOSITION OF SPECIMEN:  N/A  COUNTS:  YES  TOURNIQUET:    DICTATION: .Other Dictation: Dictation Number [id number]  PLAN OF CARE: Discharge to home after PACU  PATIENT DISPOSITION:  PACU - hemodynamically stable.

## 2013-10-11 NOTE — Anesthesia Procedure Notes (Addendum)
Anesthesia Regional Block:  Supraclavicular block  Pre-Anesthetic Checklist: ,, timeout performed, Correct Patient, Correct Site, Correct Laterality, Correct Procedure, Correct Position, site marked, Risks and benefits discussed,  Surgical consent,  Pre-op evaluation,  At surgeon's request and post-op pain management  Laterality: Left and Upper  Prep: chloraprep       Needles:  Injection technique: Single-shot  Needle Type: Echogenic Stimulator Needle     Needle Length: 5cm 5 cm Needle Gauge: 21 and 21 G    Additional Needles:  Procedures: ultrasound guided (picture in chart) Supraclavicular block Narrative:  Start time: 10/11/2013 11:35 AM End time: 10/11/2013 11:45 AM Injection made incrementally with aspirations every 5 mL.  Performed by: Personally  Anesthesiologist: Sheldon Silvan  Supraclavicular block Procedure Name: LMA Insertion Performed by: Nicki Reaper Pre-anesthesia Checklist: Patient identified, Emergency Drugs available, Suction available and Patient being monitored Patient Re-evaluated:Patient Re-evaluated prior to inductionOxygen Delivery Method: Circle System Utilized Preoxygenation: Pre-oxygenation with 100% oxygen Intubation Type: IV induction Ventilation: Mask ventilation without difficulty LMA: LMA inserted LMA Size: 4.0 Number of attempts: 1 Airway Equipment and Method: bite block Placement Confirmation: positive ETCO2 Tube secured with: Tape Dental Injury: Teeth and Oropharynx as per pre-operative assessment

## 2013-10-11 NOTE — Op Note (Signed)
Dictation Number [id number]

## 2013-10-11 NOTE — Anesthesia Preprocedure Evaluation (Addendum)
Anesthesia Evaluation  Patient identified by MRN, date of birth, ID band Patient awake    Reviewed: Allergy & Precautions, NPO status   Airway Mallampati: I TM Distance: >3 FB Neck ROM: Full    Dental  (+) Teeth Intact and Dental Advisory Given   Pulmonary  breath sounds clear to auscultation        Cardiovascular hypertension, Pt. on medications Rhythm:Regular Rate:Normal     Neuro/Psych  Neuromuscular disease    GI/Hepatic GERD-  Medicated,  Endo/Other    Renal/GU      Musculoskeletal   Abdominal   Peds  Hematology   Anesthesia Other Findings   Reproductive/Obstetrics                          Anesthesia Physical Anesthesia Plan  ASA: II  Anesthesia Plan:    Post-op Pain Management:    Induction: Intravenous  Airway Management Planned: LMA  Additional Equipment:   Intra-op Plan:   Post-operative Plan: Extubation in OR  Informed Consent: I have reviewed the patients History and Physical, chart, labs and discussed the procedure including the risks, benefits and alternatives for the proposed anesthesia with the patient or authorized representative who has indicated his/her understanding and acceptance.   Dental advisory given  Plan Discussed with: CRNA, Anesthesiologist and Surgeon  Anesthesia Plan Comments:         Anesthesia Quick Evaluation

## 2013-10-11 NOTE — Anesthesia Postprocedure Evaluation (Signed)
  Anesthesia Post-op Note  Patient: Keith Oconnell  Procedure(s) Performed: Procedure(s): LEFT ARTHROSCOPY WRIST DEBRIDMENT/SHRINKAGE (Left)  Patient Location: PACU  Anesthesia Type:GA combined with regional for post-op pain  Level of Consciousness: awake, alert  and oriented  Airway and Oxygen Therapy: Patient Spontanous Breathing  Post-op Pain: mild  Post-op Assessment: Post-op Vital signs reviewed  Post-op Vital Signs: Reviewed  Complications: No apparent anesthesia complications

## 2013-10-12 ENCOUNTER — Encounter (HOSPITAL_BASED_OUTPATIENT_CLINIC_OR_DEPARTMENT_OTHER): Payer: Self-pay | Admitting: Orthopedic Surgery

## 2013-10-14 NOTE — Op Note (Signed)
NAME:  Keith Oconnell, Keith Oconnell               ACCOUNT NO.:  [id number]  MEDICAL RECORD NO.:  [id number]  LOCATION:                                 FACILITY:  PHYSICIAN:  Cindee Salt, M.D.       DATE OF BIRTH:  [birth year]  DATE OF PROCEDURE:  10/11/2013 DATE OF DISCHARGE:                              OPERATIVE REPORT   PREOPERATIVE DIAGNOSES:  Left wrist pain, questionable radial styloid arthritis, possible scapholunate lunotriquetral tear, left wrist.  POSTOPERATIVE DIAGNOSES:  Status post radial styloid fracture healed with cartilage irregularity, lunotriquetral ligament tear.  OPERATION:  Arthroscopy, left wrist with debridement, shrinkage of lunotriquetral ligament, and partial synovectomy.  SURGEON:  Cindee Salt, MD  ASSISTANT:  Betha Loa, MD  ANESTHESIA:  Supraclavicular, block general.  ANESTHESIOLOGIST:  Sheldon Silvan, MD.  HISTORY:  The patient is a 62 year old male who suffered a fall onto his left arm.  He has suffered a rotator cuff tear.  Has had wrist pain since that time.  Primarily, radial side he has a regularity on bone scan and MRI read out as arthritic change.  He was admitted for arthroscopy, possible debridement, radial styloidectomy.  He is aware of risks and complications including infection, recurrence of injury to arteries, nerves, tendons, incomplete relief of symptoms, and dystrophy. In the preoperative area, the patient stated that Dr. Darrelyn Hillock who had operated on his shoulder would like me to manipulate his shoulder due to lack of mobility.  This was added to the consent, but Dr. Darrelyn Hillock was called to confirm that this indeed was his desire or it was not, he states that his shoulder is good enough to allow arthroscopy to be done, but does not desire a manipulation.  DESCRIPTION OF PROCEDURE:  The patient was brought to the operating room where a supraclavicular block and general anesthetic were carried out without difficulty under the direction of Dr.  Ivin Booty.  He was prepped using ChloraPrep, in supine position, with left arm free.  His shoulder was abducted to the limit of motion and no further.  The arm was placed in the Arthrex arthroscopy tower, 10 pounds of traction applied.  The portals were localized with a 22-gauge needle.  The joint inflated to 3.4 portal.  A transverse incision made deepened with a hemostat.  Blunt trocar was used to enter the joint.  The joint was inspected.  A very significant synovitis was present over the entire joint.  The radial styloid was then inspected as it had marked irregularities and fracture lines going through, but the cartilage was intact.  There was a partial tear at the radial scaphocapitate ligament.  The scapholunate ligament was intact.  The long radial lunate ligament and short radiolunate ligaments were each intact.  There were no significant changes on the proximal scaphoid and proximal lunate.  The distal articular surface of the radius and triangular fibrocartilage complex was intact.  The irrigation catheter was placed in 6 view.  A 4.5 portal opened and a lunotriquetral tear was identified with a very significant synovitis on the ulnar aspect of his wrist.  Midcarpal joint was inspected.  A type 2 lunate was present.  There were mild changes  to the proximal aspect of the hamate, but no other significant changes were noted.  The scapholunate showed no instability.  The STT joint was not visualized. The instruments were then reinserted into the proximal row.  The scapholunate ligament was patulous, so it was decided to shrink this with a Arthur wand.  A debridement of the synovitis on the radial side was done with an Merton Border wand, full radius shaver.  A shrinkage of the scapholunate ligament was then performed.  The lunotriquetral ligament was then shrunk.  The scope was alternated in 3, 4 and 4, 5 portal to do this.  The midcarpal joint was reinspected and found to have  excellent coaptation of the scapholunate ligament.  The lunotriquetral ligament still showed some instability.  The debridement was then performed to the ulnar aspect of the wrist with the Arthur wand removing as much of synovitis that was present.  The instruments were removed.  The portals closed with interrupted #4-0 Vicryl Rapide sutures.  A dorsal palmar splint applied.  The patient tolerated the procedure well and was taken to the recovery room for observation in satisfactory condition.  He will be discharged home to return in 1 week on Percocet.  The tourniquet was not inflated throughout any portion of the procedure and adequate flow was maintained throughout the procedure while using the Arthur wand.          ______________________________ Cindee Salt, M.D.     GK/MEDQ  D:  10/11/2013  T:  10/12/2013  Job:  [id number]

## 2013-11-09 ENCOUNTER — Encounter: Payer: Self-pay | Admitting: Family Medicine

## 2014-03-03 ENCOUNTER — Encounter (HOSPITAL_COMMUNITY): Payer: Self-pay | Admitting: Pharmacy Technician

## 2014-03-07 NOTE — Pre-Procedure Instructions (Signed)
Keith BrightlyRobert S Oconnell  03/07/2014   Your procedure is scheduled on:  Friday, April 17th  Report to Admitting at 0530 AM.  Call this number if you have problems the morning of surgery: [phone]   Remember:   Do not eat food or drink liquids after midnight.   Take these medicines the morning of surgery with A SIP OF WATER: norvasc, prilosec, percocet if needed    Do not wear jewelry.  Do not wear lotions, powders, or perfumes. You may wear deodorant.  Do not shave 48 hours prior to surgery. Men may shave face and neck.  Do not bring valuables to the hospital.  [facility]  for any belongings or valuables.               Contacts, dentures or bridgework may not be worn into surgery.  Leave suitcase in the car. After surgery it may be brought to your room.  For patients admitted to the hospital, discharge time is determined by your treatment team.               Patients discharged the day of surgery will not be allowed to drive home.  Please read over the following fact sheets that you were given: Pain Booklet, Coughing and Deep Breathing, Blood Transfusion Information and Surgical Site Infection Prevention Keith Oconnell - Preparing for Surgery  Before surgery, you can play an important role.  Because skin is not sterile, your skin needs to be as free of germs as possible.  You can reduce the number of germs on you skin by washing with CHG (chlorahexidine gluconate) soap before surgery.  CHG is an antiseptic cleaner which kills germs and bonds with the skin to continue killing germs even after washing.  Please DO NOT use if you have an allergy to CHG or antibacterial soaps.  If your skin becomes reddened/irritated stop using the CHG and inform your nurse when you arrive at Short Stay.  Do not shave (including legs and underarms) for at least 48 hours prior to the first CHG shower.  You may shave your face.  Please follow these instructions carefully:   1.  Shower with CHG  Soap the night before surgery and the morning of Surgery.  2.  If you choose to wash your hair, wash your hair first as usual with your normal shampoo.  3.  After you shampoo, rinse your hair and body thoroughly to remove the shampoo.  4.  Use CHG as you would any other liquid soap.  You can apply CHG directly to the skin and wash gently with scrungie or a clean washcloth.  5.  Apply the CHG Soap to your body ONLY FROM THE NECK DOWN.  Do not use on open wounds or open sores.  Avoid contact with your eyes, ears, mouth and genitals (private parts).  Wash genitals (private parts) with your normal soap.  6.  Wash thoroughly, paying special attention to the area where your surgery will be performed.  7.  Thoroughly rinse your body with warm water from the neck down.  8.  DO NOT shower/wash with your normal soap after using and rinsing off the CHG Soap.  9.  Pat yourself dry with a clean towel.            10.  Wear clean pajamas.            11.  Place clean sheets on your bed the night of your first shower and  do not sleep with pets.  Day of Surgery  Do not apply any lotions/deoderants the morning of surgery.  Please wear clean clothes to the hospital/surgery center.

## 2014-03-08 ENCOUNTER — Encounter (HOSPITAL_COMMUNITY): Payer: Self-pay

## 2014-03-08 ENCOUNTER — Encounter (HOSPITAL_COMMUNITY)
Admission: RE | Admit: 2014-03-08 | Discharge: 2014-03-08 | Disposition: A | Discharge status: Home / Self Care | Payer: Worker's Compensation | Source: Ambulatory Visit | Attending: Orthopedic Surgery | Admitting: Orthopedic Surgery

## 2014-03-08 DIAGNOSIS — Z01812 Encounter for preprocedural laboratory examination: Secondary | ICD-10-CM | POA: Insufficient documentation

## 2014-03-08 HISTORY — DX: Type 2 diabetes mellitus without complications: E11.9

## 2014-03-08 LAB — ABO/RH: ABO/RH(D): A POS

## 2014-03-08 LAB — CBC
HCT: 41.1 % (ref 39.0–52.0)
Hemoglobin: 14.7 g/dL (ref 13.0–17.0)
MCH: 32.4 pg (ref 26.0–34.0)
MCHC: 35.8 g/dL (ref 30.0–36.0)
MCV: 90.5 fL (ref 78.0–100.0)
Platelets: 241 10*3/uL (ref 150–400)
RBC: 4.54 MIL/uL (ref 4.22–5.81)
RDW: 13.6 % (ref 11.5–15.5)
WBC: 5.8 10*3/uL (ref 4.0–10.5)

## 2014-03-08 LAB — TYPE AND SCREEN
ABO/RH(D): A POS
ANTIBODY SCREEN: NEGATIVE

## 2014-03-08 LAB — BASIC METABOLIC PANEL
BUN: 16 mg/dL (ref 6–23)
CO2: 23 mEq/L (ref 19–32)
Calcium: 10.1 mg/dL (ref 8.4–10.5)
Chloride: 103 mEq/L (ref 96–112)
Creatinine, Ser: 1.19 mg/dL (ref 0.50–1.35)
GFR calc Af Amer: 73 mL/min — ABNORMAL LOW (ref >90)
GFR, EST NON AFRICAN AMERICAN: 63 mL/min — AB (ref >90)
Glucose, Bld: 118 mg/dL — ABNORMAL HIGH (ref 70–99)
POTASSIUM: 3.6 meq/L — AB (ref 3.7–5.3)
Sodium: 141 mEq/L (ref 137–147)

## 2014-03-08 NOTE — Progress Notes (Signed)
03/08/14 1028  OBSTRUCTIVE SLEEP APNEA  Have you ever been diagnosed with sleep apnea through a sleep study? No  Do you snore loudly (loud enough to be heard through closed doors)?  1  Do you often feel tired, fatigued, or sleepy during the daytime? 0  Has anyone observed you stop breathing during your sleep? 0  Do you have, or are you being treated for high blood pressure? 1  BMI more than 35 kg/m2? 0  Age over 64 years old? 1  Neck circumference greater than 40 cm/18 inches? 0  Gender: 1  Obstructive Sleep Apnea Score 4  Score 4 or greater  Results sent to PCP

## 2014-03-08 NOTE — H&P (Signed)
Keith Oconnell is an 63 y.o. male.    Chief Complaint: left shoulder pain and weakness  HPI: Pt is a 63 y.o. male complaining of left shoulder pain for multiple years. Pain had continually increased since the beginning. X-rays in the clinic show end-stage arthritic changes of the left shoulder. Pt has tried various conservative treatments which have failed to alleviate their symptoms, including therapy and injections. Various options are discussed with the patient. Risks, benefits and expectations were discussed with the patient. Patient understand the risks, benefits and expectations and wishes to proceed with surgery.   PCP:  Josue HectorNYLAND,LEONARD Oluwanifemi, MD  D/C Plans:  Home with HHPT  PMH: Past Medical History  Diagnosis Date  . Hypertension   . Arthritis   . Hyperlipidemia   . GERD (gastroesophageal reflux disease)   . History of blood transfusion   . Psoriasis   . History of elevated glucose     per pt- pre diabetic  . Diabetes mellitus without complication     PSH: Past Surgical History  Procedure Laterality Date  . Dental surgery      pins placed upper front tooth  . Shoulder open rotator cuff repair  10/07/2011    Procedure: ROTATOR CUFF REPAIR SHOULDER OPEN;  Surgeon: Jacki Conesonald A Gioffre;  Location: [facility];  Service: Orthopedics;  Laterality: Right;  Open Rotator Cuff Repair/Acrominectomy with Graft and Anchors  . Wrist surgery Right     repair fractured wrist  . Shoulder open rotator cuff repair Left 06/08/2013    Procedure: OPEN LEFT SHOULDER ROTATOR CUFF REPAIR , complex repair with 3 anchors and graft;  Surgeon: Jacki Conesonald A Gioffre, MD;  Location: [facility];  Service: Orthopedics;  Laterality: Left;  . Back surgery  1995    lumbar  . Colonoscopy    . Wrist arthroscopy Left 10/11/2013    Procedure: LEFT ARTHROSCOPY WRIST DEBRIDMENT/SHRINKAGE;  Surgeon: Nicki ReaperGary R Kuzma, MD;  Location: [facility];  Service: Orthopedics;  Laterality: Left;    Social History:  reports  that he has never smoked. He has never used smokeless tobacco. He reports that he does not drink alcohol or use illicit drugs.  Allergies:  Allergies  Allergen Reactions  . Morphine And Related Nausea And Vomiting    Medications: No current facility-administered medications for this encounter.   Current Outpatient Prescriptions  Medication Sig Dispense Refill  . amLODipine (NORVASC) 5 MG tablet Take 5 mg by mouth daily with breakfast.       . aspirin EC 325 MG tablet Take 325 mg by mouth daily.       . Coenzyme Q10 (CO Q 10 PO) Take 1 tablet by mouth daily.      . fenofibrate micronized (ANTARA) 130 MG capsule Take 130 mg by mouth daily before breakfast.       . Garlic 1000 MG CAPS Take 1 capsule by mouth daily.       Marland Kitchen. glipiZIDE (GLUCOTROL XL) 5 MG 24 hr tablet Take 5 mg by mouth daily with breakfast.      . halobetasol (ULTRAVATE) 0.05 % cream Apply 1 application topically daily as needed (for psorisis).      Marland Kitchen. ibuprofen (ADVIL,MOTRIN) 200 MG tablet Take 200-600 mg by mouth every 6 (six) hours as needed for pain.      Marland Kitchen. lisinopril-hydrochlorothiazide (PRINZIDE,ZESTORETIC) 20-25 MG per tablet Take 1 tablet by mouth daily with breakfast.       . methocarbamol (ROBAXIN) 500 MG tablet Take 1 tablet (500  mg total) by mouth every 6 (six) hours as needed.  40 tablet  1  . Multiple Vitamins-Minerals (MULTIVITAMINS THER. W/MINERALS) TABS Take 1 tablet by mouth daily.       Marland Kitchen omega-3 acid ethyl esters (LOVAZA) 1 G capsule Take 1-2 g by mouth 2 (two) times daily. 1 CAPSULES IN THE MORNING AND 2 CAPSULES IN THE EVENING      . omeprazole (PRILOSEC OTC) 20 MG tablet Take 20 mg by mouth daily as needed (for reflux).      Marland Kitchen oxyCODONE-acetaminophen (PERCOCET/ROXICET) 5-325 MG per tablet Take 1-2 tablets by mouth every 4 (four) hours as needed.  60 tablet  0  . simvastatin (ZOCOR) 20 MG tablet Take 20 mg by mouth every evening.        No results found for this or any previous visit (from the past 48  hour(s)). No results found.  ROS: Pain with rom of the left upper extremity  Physical Exam:  Alert and oriented 63 y.o. male in no acute distress Cranial nerves 2-12 intact Cervical spine: full rom with no tenderness, nv intact distally Chest: active breath sounds bilaterally, no wheeze rhonchi or rales Heart: regular rate and rhythm, no murmur Abd: non tender non distended with active bowel sounds Hip is stable with rom  Left shoulder with limited rom and crepitus nv intact distally ER and IR strength 3.5/5  No rashes or edema  Assessment/Plan Assessment: left shoulder rotator cuff insufficiency  Plan: Patient will undergo a left reverse total shoulder arthroplasty by Dr. Ranell Patrick at [facility]. Risks benefits and expectations were discussed with the patient. Patient understand risks, benefits and expectations and wishes to proceed.

## 2014-03-09 NOTE — Progress Notes (Signed)
Anesthesia Chart Review:  Patient is a 63 year old male scheduled for left reverse total shoulder on 03/17/14 by Dr. Ranell PatrickNorris.  History includes non-smoker, HTN, GERD, DM2, arthritis, HLD, psoriasis, back surgery, bilateral rotator cuff repair.  PCP is Dr. Joette CatchingLeonard Nyland who cleared patient for this procedure.   Preoperative labs noted.    EKG on 04/07/13 showed: NSR, poor r wave progression/possible anterior infarct (age undetermined). Overall, I think his EKG is stable when compared to prior tracing on 10/07/11.  He has tolerated two orthopedic procedures since his 03/2013 EKG.  No CV symptoms were documented from his PAT visit. He has also been medically cleared for surgery.  Further evaluation by his assigned anesthesiologist, but if no acute changes then I anticipate that he can proceed as planned.  Keith Ochsllison Jamarkis Branam, PA-C [facility]Anesthesiology Phone [phone] 03/09/2014 12:39 PM

## 2014-03-16 MED ORDER — CEFAZOLIN SODIUM-DEXTROSE 2-3 GM-% IV SOLR
2.0000 g | INTRAVENOUS | Status: AC
  Administered 2014-03-17: 2 g via INTRAVENOUS
  Filled 2014-03-16: qty 50

## 2014-03-17 ENCOUNTER — Encounter (HOSPITAL_COMMUNITY): Payer: Self-pay | Admitting: Certified Registered"

## 2014-03-17 ENCOUNTER — Inpatient Hospital Stay (HOSPITAL_COMMUNITY)
Admission: RE | Admit: 2014-03-17 | Discharge: 2014-03-18 | DRG: 483 | Disposition: A | Discharge status: Home / Self Care | Payer: Worker's Compensation | Source: Ambulatory Visit | Attending: Orthopedic Surgery | Admitting: Orthopedic Surgery

## 2014-03-17 ENCOUNTER — Inpatient Hospital Stay (HOSPITAL_COMMUNITY): Payer: Worker's Compensation

## 2014-03-17 ENCOUNTER — Inpatient Hospital Stay (HOSPITAL_COMMUNITY): Payer: Worker's Compensation | Admitting: Certified Registered"

## 2014-03-17 ENCOUNTER — Encounter (HOSPITAL_COMMUNITY): Payer: Worker's Compensation | Admitting: Vascular Surgery

## 2014-03-17 ENCOUNTER — Encounter (HOSPITAL_COMMUNITY)
Admission: RE | Disposition: A | Discharge status: Home / Self Care | Payer: Self-pay | Source: Ambulatory Visit | Attending: Orthopedic Surgery

## 2014-03-17 DIAGNOSIS — S46002S Unspecified injury of muscle(s) and tendon(s) of the rotator cuff of left shoulder, sequela: Secondary | ICD-10-CM

## 2014-03-17 DIAGNOSIS — K219 Gastro-esophageal reflux disease without esophagitis: Secondary | ICD-10-CM | POA: Diagnosis present

## 2014-03-17 DIAGNOSIS — Z79899 Other long term (current) drug therapy: Secondary | ICD-10-CM

## 2014-03-17 DIAGNOSIS — I10 Essential (primary) hypertension: Secondary | ICD-10-CM | POA: Diagnosis present

## 2014-03-17 DIAGNOSIS — G8929 Other chronic pain: Secondary | ICD-10-CM | POA: Diagnosis present

## 2014-03-17 DIAGNOSIS — E119 Type 2 diabetes mellitus without complications: Secondary | ICD-10-CM | POA: Diagnosis present

## 2014-03-17 DIAGNOSIS — M19112 Post-traumatic osteoarthritis, left shoulder: Secondary | ICD-10-CM

## 2014-03-17 DIAGNOSIS — Z7982 Long term (current) use of aspirin: Secondary | ICD-10-CM

## 2014-03-17 DIAGNOSIS — E785 Hyperlipidemia, unspecified: Secondary | ICD-10-CM | POA: Diagnosis present

## 2014-03-17 DIAGNOSIS — M75100 Unspecified rotator cuff tear or rupture of unspecified shoulder, not specified as traumatic: Secondary | ICD-10-CM

## 2014-03-17 DIAGNOSIS — S43429A Sprain of unspecified rotator cuff capsule, initial encounter: Principal | ICD-10-CM | POA: Diagnosis present

## 2014-03-17 DIAGNOSIS — M129 Arthropathy, unspecified: Secondary | ICD-10-CM | POA: Diagnosis present

## 2014-03-17 DIAGNOSIS — X58XXXA Exposure to other specified factors, initial encounter: Secondary | ICD-10-CM | POA: Diagnosis present

## 2014-03-17 HISTORY — PX: REVERSE SHOULDER ARTHROPLASTY: SHX5054

## 2014-03-17 LAB — GLUCOSE, CAPILLARY
GLUCOSE-CAPILLARY: 131 mg/dL — AB (ref 70–99)
GLUCOSE-CAPILLARY: 130 mg/dL — AB (ref 70–99)
Glucose-Capillary: 108 mg/dL — ABNORMAL HIGH (ref 70–99)
Glucose-Capillary: 146 mg/dL — ABNORMAL HIGH (ref 70–99)
Glucose-Capillary: 174 mg/dL — ABNORMAL HIGH (ref 70–99)

## 2014-03-17 SURGERY — ARTHROPLASTY, SHOULDER, TOTAL, REVERSE
Anesthesia: Regional | Site: Shoulder | Laterality: Left

## 2014-03-17 MED ORDER — AMLODIPINE BESYLATE 5 MG PO TABS
5.0000 mg | ORAL_TABLET | Freq: Every day | ORAL | Status: DC
  Administered 2014-03-18: 5 mg via ORAL
  Filled 2014-03-17 (×2): qty 1

## 2014-03-17 MED ORDER — FENTANYL CITRATE 0.05 MG/ML IJ SOLN
INTRAMUSCULAR | Status: DC | PRN
  Administered 2014-03-17 (×2): 100 ug via INTRAVENOUS

## 2014-03-17 MED ORDER — PROPOFOL 10 MG/ML IV BOLUS
INTRAVENOUS | Status: AC
  Filled 2014-03-17: qty 20

## 2014-03-17 MED ORDER — METHOCARBAMOL 500 MG PO TABS: 500.0000 mg | ORAL_TABLET | Freq: Three times a day (TID) | ORAL | Status: DC | PRN

## 2014-03-17 MED ORDER — TRIAMCINOLONE ACETONIDE 0.5 % EX CREA
TOPICAL_CREAM | Freq: Every day | CUTANEOUS | Status: DC | PRN
  Filled 2014-03-17: qty 15

## 2014-03-17 MED ORDER — OMEGA-3-ACID ETHYL ESTERS 1 G PO CAPS
1.0000 g | ORAL_CAPSULE | Freq: Two times a day (BID) | ORAL | Status: DC
  Administered 2014-03-17 – 2014-03-18 (×3): 1 g via ORAL
  Filled 2014-03-17 (×4): qty 1

## 2014-03-17 MED ORDER — ONDANSETRON HCL 4 MG PO TABS
4.0000 mg | ORAL_TABLET | Freq: Four times a day (QID) | ORAL | Status: DC | PRN
  Administered 2014-03-17 – 2014-03-18 (×2): 4 mg via ORAL
  Filled 2014-03-17 (×2): qty 1

## 2014-03-17 MED ORDER — PHENYLEPHRINE HCL 10 MG/ML IJ SOLN
10.0000 mg | INTRAMUSCULAR | Status: DC | PRN
  Administered 2014-03-17: 40 ug/min via INTRAVENOUS

## 2014-03-17 MED ORDER — OMEGA-3-ACID ETHYL ESTERS 1 G PO CAPS: 1.0000 g | ORAL_CAPSULE | Freq: Two times a day (BID) | ORAL | Status: DC

## 2014-03-17 MED ORDER — METHOCARBAMOL 500 MG PO TABS
500.0000 mg | ORAL_TABLET | Freq: Four times a day (QID) | ORAL | Status: DC | PRN
  Administered 2014-03-17 – 2014-03-18 (×3): 500 mg via ORAL
  Filled 2014-03-17 (×3): qty 1

## 2014-03-17 MED ORDER — NEOSTIGMINE METHYLSULFATE 1 MG/ML IJ SOLN
INTRAMUSCULAR | Status: DC | PRN
  Administered 2014-03-17: 3 mg via INTRAVENOUS

## 2014-03-17 MED ORDER — FENTANYL CITRATE 0.05 MG/ML IJ SOLN
INTRAMUSCULAR | Status: AC
  Filled 2014-03-17: qty 5

## 2014-03-17 MED ORDER — METOCLOPRAMIDE HCL 5 MG/ML IJ SOLN: 5.0000 mg | Freq: Three times a day (TID) | INTRAMUSCULAR | Status: DC | PRN

## 2014-03-17 MED ORDER — FENOFIBRATE 160 MG PO TABS
160.0000 mg | ORAL_TABLET | Freq: Every day | ORAL | Status: DC
  Administered 2014-03-17 – 2014-03-18 (×2): 160 mg via ORAL
  Filled 2014-03-17 (×2): qty 1

## 2014-03-17 MED ORDER — PROPOFOL 10 MG/ML IV BOLUS
INTRAVENOUS | Status: DC | PRN
  Administered 2014-03-17: 200 mg via INTRAVENOUS

## 2014-03-17 MED ORDER — PHENOL 1.4 % MT LIQD: 1.0000 | OROMUCOSAL | Status: DC | PRN

## 2014-03-17 MED ORDER — OXYCODONE-ACETAMINOPHEN 5-325 MG PO TABS: 1.0000 | ORAL_TABLET | ORAL | Status: DC | PRN

## 2014-03-17 MED ORDER — GLYCOPYRROLATE 0.2 MG/ML IJ SOLN
INTRAMUSCULAR | Status: AC
  Filled 2014-03-17: qty 3

## 2014-03-17 MED ORDER — FENTANYL CITRATE 0.05 MG/ML IJ SOLN: 25.0000 ug | INTRAMUSCULAR | Status: DC | PRN

## 2014-03-17 MED ORDER — LISINOPRIL-HYDROCHLOROTHIAZIDE 20-25 MG PO TABS: 1.0000 | ORAL_TABLET | Freq: Every day | ORAL | Status: DC

## 2014-03-17 MED ORDER — ONDANSETRON HCL 4 MG/2ML IJ SOLN
INTRAMUSCULAR | Status: DC | PRN
  Administered 2014-03-17: 4 mg via INTRAVENOUS

## 2014-03-17 MED ORDER — BUPIVACAINE-EPINEPHRINE 0.25% -1:200000 IJ SOLN
INTRAMUSCULAR | Status: DC | PRN
  Administered 2014-03-17: 7 mL

## 2014-03-17 MED ORDER — LACTATED RINGERS IV SOLN
INTRAVENOUS | Status: DC | PRN
  Administered 2014-03-17 (×2): via INTRAVENOUS

## 2014-03-17 MED ORDER — MENTHOL 3 MG MT LOZG: 1.0000 | LOZENGE | OROMUCOSAL | Status: DC | PRN

## 2014-03-17 MED ORDER — ONDANSETRON HCL 4 MG/2ML IJ SOLN
INTRAMUSCULAR | Status: AC
  Filled 2014-03-17: qty 2

## 2014-03-17 MED ORDER — LIDOCAINE HCL (CARDIAC) 20 MG/ML IV SOLN
INTRAVENOUS | Status: DC | PRN
  Administered 2014-03-17: 80 mg via INTRAVENOUS

## 2014-03-17 MED ORDER — BUPIVACAINE HCL (PF) 0.25 % IJ SOLN
INTRAMUSCULAR | Status: AC
  Filled 2014-03-17: qty 30

## 2014-03-17 MED ORDER — LISINOPRIL 20 MG PO TABS
20.0000 mg | ORAL_TABLET | Freq: Every day | ORAL | Status: DC
  Administered 2014-03-18: 20 mg via ORAL
  Filled 2014-03-17: qty 1

## 2014-03-17 MED ORDER — ROCURONIUM BROMIDE 50 MG/5ML IV SOLN
INTRAVENOUS | Status: AC
  Filled 2014-03-17: qty 1

## 2014-03-17 MED ORDER — SUCCINYLCHOLINE CHLORIDE 20 MG/ML IJ SOLN
INTRAMUSCULAR | Status: AC
  Filled 2014-03-17: qty 1

## 2014-03-17 MED ORDER — THERA M PLUS PO TABS: 1.0000 | ORAL_TABLET | Freq: Every day | ORAL | Status: DC

## 2014-03-17 MED ORDER — ACETAMINOPHEN 500 MG PO TABS: 500.0000 mg | ORAL_TABLET | Freq: Four times a day (QID) | ORAL | Status: DC | PRN

## 2014-03-17 MED ORDER — ADULT MULTIVITAMIN W/MINERALS CH
1.0000 | ORAL_TABLET | Freq: Every day | ORAL | Status: DC
  Administered 2014-03-17 – 2014-03-18 (×2): 1 via ORAL
  Filled 2014-03-17 (×2): qty 1

## 2014-03-17 MED ORDER — HYDROCHLOROTHIAZIDE 25 MG PO TABS
25.0000 mg | ORAL_TABLET | Freq: Every day | ORAL | Status: DC
  Administered 2014-03-18: 25 mg via ORAL
  Filled 2014-03-17: qty 1

## 2014-03-17 MED ORDER — PANTOPRAZOLE SODIUM 40 MG PO TBEC: 40.0000 mg | DELAYED_RELEASE_TABLET | Freq: Every day | ORAL | Status: DC | PRN

## 2014-03-17 MED ORDER — ACETAMINOPHEN 325 MG PO TABS: 650.0000 mg | ORAL_TABLET | Freq: Four times a day (QID) | ORAL | Status: DC | PRN

## 2014-03-17 MED ORDER — LIDOCAINE HCL (CARDIAC) 20 MG/ML IV SOLN
INTRAVENOUS | Status: AC
  Filled 2014-03-17: qty 5

## 2014-03-17 MED ORDER — OXYCODONE-ACETAMINOPHEN 5-325 MG PO TABS
1.0000 | ORAL_TABLET | ORAL | Status: DC | PRN
  Administered 2014-03-17 – 2014-03-18 (×6): 2 via ORAL
  Filled 2014-03-17 (×6): qty 2

## 2014-03-17 MED ORDER — ASPIRIN EC 325 MG PO TBEC
325.0000 mg | DELAYED_RELEASE_TABLET | Freq: Every day | ORAL | Status: DC
  Administered 2014-03-17 – 2014-03-18 (×2): 325 mg via ORAL
  Filled 2014-03-17 (×2): qty 1

## 2014-03-17 MED ORDER — OMEPRAZOLE MAGNESIUM 20 MG PO TBEC: 20.0000 mg | DELAYED_RELEASE_TABLET | Freq: Every day | ORAL | Status: DC | PRN

## 2014-03-17 MED ORDER — BUPIVACAINE-EPINEPHRINE (PF) 0.25% -1:200000 IJ SOLN
INTRAMUSCULAR | Status: AC
  Filled 2014-03-17: qty 30

## 2014-03-17 MED ORDER — METOCLOPRAMIDE HCL 5 MG PO TABS
5.0000 mg | ORAL_TABLET | Freq: Three times a day (TID) | ORAL | Status: DC | PRN
  Filled 2014-03-17: qty 2

## 2014-03-17 MED ORDER — SIMVASTATIN 20 MG PO TABS
20.0000 mg | ORAL_TABLET | Freq: Every evening | ORAL | Status: DC
  Administered 2014-03-17: 20 mg via ORAL
  Filled 2014-03-17 (×2): qty 1

## 2014-03-17 MED ORDER — HYDROMORPHONE HCL PF 1 MG/ML IJ SOLN
1.0000 mg | INTRAMUSCULAR | Status: DC | PRN
  Administered 2014-03-17: 2 mg via INTRAVENOUS
  Administered 2014-03-18: 1 mg via INTRAVENOUS
  Filled 2014-03-17: qty 1
  Filled 2014-03-17: qty 2

## 2014-03-17 MED ORDER — ROCURONIUM BROMIDE 100 MG/10ML IV SOLN
INTRAVENOUS | Status: DC | PRN
  Administered 2014-03-17: 10 mg via INTRAVENOUS
  Administered 2014-03-17: 40 mg via INTRAVENOUS

## 2014-03-17 MED ORDER — NEOSTIGMINE METHYLSULFATE 1 MG/ML IJ SOLN
INTRAMUSCULAR | Status: AC
  Filled 2014-03-17: qty 10

## 2014-03-17 MED ORDER — HALOBETASOL PROPIONATE 0.05 % EX CREA: 1.0000 "application " | TOPICAL_CREAM | Freq: Every day | CUTANEOUS | Status: DC | PRN

## 2014-03-17 MED ORDER — GLYCOPYRROLATE 0.2 MG/ML IJ SOLN
INTRAMUSCULAR | Status: DC | PRN
  Administered 2014-03-17: .5 mg via INTRAVENOUS
  Administered 2014-03-17: 0.2 mg via INTRAVENOUS

## 2014-03-17 MED ORDER — SODIUM CHLORIDE 0.9 % IR SOLN
Status: DC | PRN
  Administered 2014-03-17: 1000 mL

## 2014-03-17 MED ORDER — CEFAZOLIN SODIUM-DEXTROSE 2-3 GM-% IV SOLR
2.0000 g | Freq: Four times a day (QID) | INTRAVENOUS | Status: AC
  Administered 2014-03-17 – 2014-03-18 (×3): 2 g via INTRAVENOUS
  Filled 2014-03-17 (×4): qty 50

## 2014-03-17 MED ORDER — ONDANSETRON HCL 4 MG/2ML IJ SOLN
4.0000 mg | Freq: Four times a day (QID) | INTRAMUSCULAR | Status: DC | PRN
  Administered 2014-03-17: 4 mg via INTRAVENOUS
  Filled 2014-03-17: qty 2

## 2014-03-17 MED ORDER — PHENYLEPHRINE HCL 10 MG/ML IJ SOLN
INTRAMUSCULAR | Status: DC | PRN
  Administered 2014-03-17: 40 ug via INTRAVENOUS
  Administered 2014-03-17 (×2): 80 ug via INTRAVENOUS

## 2014-03-17 MED ORDER — SODIUM CHLORIDE 0.9 % IV SOLN
INTRAVENOUS | Status: DC
Start: 2014-03-17 — End: 2014-03-18

## 2014-03-17 MED ORDER — GLIPIZIDE ER 5 MG PO TB24
5.0000 mg | ORAL_TABLET | Freq: Every day | ORAL | Status: DC
  Administered 2014-03-18: 5 mg via ORAL
  Filled 2014-03-17 (×2): qty 1

## 2014-03-17 MED ORDER — MIDAZOLAM HCL 5 MG/5ML IJ SOLN
INTRAMUSCULAR | Status: DC | PRN
  Administered 2014-03-17: 2 mg via INTRAVENOUS

## 2014-03-17 MED ORDER — CHLORHEXIDINE GLUCONATE 4 % EX LIQD: 60.0000 mL | Freq: Once | CUTANEOUS | Status: DC

## 2014-03-17 MED ORDER — PHENYLEPHRINE 40 MCG/ML (10ML) SYRINGE FOR IV PUSH (FOR BLOOD PRESSURE SUPPORT)
PREFILLED_SYRINGE | INTRAVENOUS | Status: AC
  Filled 2014-03-17: qty 10

## 2014-03-17 MED ORDER — MIDAZOLAM HCL 2 MG/2ML IJ SOLN
INTRAMUSCULAR | Status: AC
  Filled 2014-03-17: qty 2

## 2014-03-17 MED ORDER — ACETAMINOPHEN 650 MG RE SUPP
650.0000 mg | Freq: Four times a day (QID) | RECTAL | Status: DC | PRN
Start: 2014-03-17 — End: 2014-03-18

## 2014-03-17 SURGICAL SUPPLY — 63 items
BIT DRILL 170X2.5X (BIT) IMPLANT
BIT DRL 170X2.5X (BIT)
BLADE SAG 18X100X1.27 (BLADE) ×2 IMPLANT
CAPT SHOULD DELTAXTEND CEM MOD ×1 IMPLANT
COVER SURGICAL LIGHT HANDLE (MISCELLANEOUS) ×2 IMPLANT
DRAPE INCISE IOBAN 66X45 STRL (DRAPES) ×6 IMPLANT
DRAPE U-SHAPE 47X51 STRL (DRAPES) ×2 IMPLANT
DRAPE X-RAY CASS 24X20 (DRAPES) IMPLANT
DRILL 2.5 (BIT)
DRSG ADAPTIC 3X8 NADH LF (GAUZE/BANDAGES/DRESSINGS) ×2 IMPLANT
DRSG PAD ABDOMINAL 8X10 ST (GAUZE/BANDAGES/DRESSINGS) ×2 IMPLANT
DURAPREP 26ML APPLICATOR (WOUND CARE) ×2 IMPLANT
ELECT BLADE 4.0 EZ CLEAN MEGAD (MISCELLANEOUS) ×2
ELECT NDL TIP 2.8 STRL (NEEDLE) ×1 IMPLANT
ELECT NEEDLE TIP 2.8 STRL (NEEDLE) ×2 IMPLANT
ELECT REM PT RETURN 9FT ADLT (ELECTROSURGICAL) ×2
ELECTRODE BLDE 4.0 EZ CLN MEGD (MISCELLANEOUS) ×1 IMPLANT
ELECTRODE REM PT RTRN 9FT ADLT (ELECTROSURGICAL) ×1 IMPLANT
GLOVE BIOGEL PI ORTHO PRO 7.5 (GLOVE) ×1
GLOVE BIOGEL PI ORTHO PRO SZ8 (GLOVE) ×1
GLOVE ORTHO TXT STRL SZ7.5 (GLOVE) ×2 IMPLANT
GLOVE PI ORTHO PRO STRL 7.5 (GLOVE) ×1 IMPLANT
GLOVE PI ORTHO PRO STRL SZ8 (GLOVE) ×1 IMPLANT
GLOVE SURG ORTHO 8.5 STRL (GLOVE) ×2 IMPLANT
GOWN STRL REUS W/ TWL LRG LVL3 (GOWN DISPOSABLE) ×1 IMPLANT
GOWN STRL REUS W/ TWL XL LVL3 (GOWN DISPOSABLE) ×2 IMPLANT
GOWN STRL REUS W/TWL LRG LVL3 (GOWN DISPOSABLE) ×2
GOWN STRL REUS W/TWL XL LVL3 (GOWN DISPOSABLE) ×4
HANDPIECE INTERPULSE COAX TIP (DISPOSABLE)
KIT BASIN OR (CUSTOM PROCEDURE TRAY) ×2 IMPLANT
KIT ROOM TURNOVER OR (KITS) ×2 IMPLANT
MANIFOLD NEPTUNE II (INSTRUMENTS) ×2 IMPLANT
NDL 1/2 CIR MAYO (NEEDLE) ×1 IMPLANT
NDL HYPO 25GX1X1/2 BEV (NEEDLE) ×1 IMPLANT
NEEDLE 1/2 CIR MAYO (NEEDLE) ×2 IMPLANT
NEEDLE HYPO 25GX1X1/2 BEV (NEEDLE) ×2 IMPLANT
NS IRRIG 1000ML POUR BTL (IV SOLUTION) ×2 IMPLANT
PACK SHOULDER (CUSTOM PROCEDURE TRAY) ×2 IMPLANT
PAD ARMBOARD 7.5X6 YLW CONV (MISCELLANEOUS) ×4 IMPLANT
PIN GUIDE 1.2 (PIN) IMPLANT
PIN GUIDE GLENOPHERE 1.5MX300M (PIN) IMPLANT
PIN METAGLENE 2.5 (PIN) IMPLANT
SET HNDPC FAN SPRY TIP SCT (DISPOSABLE) IMPLANT
SLING ARM LRG ADULT FOAM STRAP (SOFTGOODS) ×1 IMPLANT
SLING ARM MED ADULT FOAM STRAP (SOFTGOODS) IMPLANT
SPONGE GAUZE 4X4 12PLY (GAUZE/BANDAGES/DRESSINGS) ×2 IMPLANT
SPONGE LAP 18X18 X RAY DECT (DISPOSABLE) ×2 IMPLANT
SPONGE LAP 4X18 X RAY DECT (DISPOSABLE) ×2 IMPLANT
STRIP CLOSURE SKIN 1/2X4 (GAUZE/BANDAGES/DRESSINGS) ×3 IMPLANT
SUCTION FRAZIER TIP 10 FR DISP (SUCTIONS) ×2 IMPLANT
SUT FIBERWIRE #2 38 T-5 BLUE (SUTURE) ×4
SUT MNCRL AB 4-0 PS2 18 (SUTURE) ×2 IMPLANT
SUT VIC AB 2-0 CT1 27 (SUTURE) ×2
SUT VIC AB 2-0 CT1 TAPERPNT 27 (SUTURE) ×1 IMPLANT
SUT VICRYL 0 CT 1 36IN (SUTURE) ×2 IMPLANT
SUTURE FIBERWR #2 38 T-5 BLUE (SUTURE) ×2 IMPLANT
SYR CONTROL 10ML LL (SYRINGE) ×2 IMPLANT
TOWEL OR 17X24 6PK STRL BLUE (TOWEL DISPOSABLE) ×2 IMPLANT
TOWEL OR 17X26 10 PK STRL BLUE (TOWEL DISPOSABLE) ×2 IMPLANT
TOWER CARTRIDGE SMART MIX (DISPOSABLE) IMPLANT
TRAY FOLEY CATH 16FRSI W/METER (SET/KITS/TRAYS/PACK) ×2 IMPLANT
WATER STERILE IRR 1000ML POUR (IV SOLUTION) ×2 IMPLANT
YANKAUER SUCT BULB TIP NO VENT (SUCTIONS) IMPLANT

## 2014-03-17 NOTE — Anesthesia Procedure Notes (Addendum)
Procedure Name: Intubation Date/Time: 03/17/2014 7:45 AM Performed by: Charm BargesBUTLER, DAVID R Pre-anesthesia Checklist: Patient identified, Emergency Drugs available, Suction available, Patient being monitored and Timeout performed Patient Re-evaluated:Patient Re-evaluated prior to inductionOxygen Delivery Method: Circle system utilized Preoxygenation: Pre-oxygenation with 100% oxygen Intubation Type: IV induction Ventilation: Mask ventilation without difficulty Laryngoscope Size: Mac and 4 Grade View: Grade I Tube type: Oral Tube size: 7.5 mm Number of attempts: 1 Airway Equipment and Method: Stylet Placement Confirmation: ETT inserted through vocal cords under direct vision,  positive ETCO2 and breath sounds checked- equal and bilateral Secured at: 24 cm Tube secured with: Tape Dental Injury: Teeth and Oropharynx as per pre-operative assessment    Anesthesia Regional Block:  Interscalene brachial plexus block  Pre-Anesthetic Checklist: ,, timeout performed, Correct Patient, Correct Site, Correct Laterality, Correct Procedure, Correct Position, site marked, Risks and benefits discussed,  Surgical consent,  Pre-op evaluation,  At surgeon's request and post-op pain management  Laterality: Left  Prep: chloraprep       Needles:   Needle Type: Stimulator Needle - 40          Additional Needles:  Procedures: Doppler guided and ultrasound guided (picture in chart) Interscalene brachial plexus block  Nerve Stimulator or Paresthesia:  Response: 0.5 mA,   Additional Responses:   Narrative:  Start time: 03/17/2014 7:05 AM End time: 03/17/2014 7:20 AM Injection made incrementally with aspirations every 5 mL. Anesthesiologist: Dr. Maple HudsonMoser

## 2014-03-17 NOTE — Progress Notes (Signed)
Utilization review completed.  

## 2014-03-17 NOTE — Progress Notes (Signed)
Pt states that was working in the yard and now has some sinus drainage and an occasional cough.  (Not from down deep, just in back of throat)

## 2014-03-17 NOTE — Transfer of Care (Signed)
Immediate Anesthesia Transfer of Care Note  Patient: Keith BrightlyRobert S Oconnell  Procedure(s) Performed: Procedure(s): LEFT REVERSE SHOULDER ARTHROPLASTY (Left)  Patient Location: PACU  Anesthesia Type:GA combined with regional for post-op pain  Level of Consciousness: sedated  Airway & Oxygen Therapy: Patient Spontanous Breathing and Patient connected to nasal cannula oxygen  Post-op Assessment: Report given to PACU RN, Post -op Vital signs reviewed and stable and Patient moving all extremities  Post vital signs: Reviewed and stable  Complications: No apparent anesthesia complications

## 2014-03-17 NOTE — Progress Notes (Signed)
Orthopedics Progress Note  Subjective: I am sore but doing well.  Objective:  Filed Vitals:   03/17/14 1037  BP: 102/54  Pulse: 71  Temp: 97.7 F (36.5 C)  Resp: 20    General: Awake and alert  Musculoskeletal: left shoulder dressing clean and dry and intact. Able to wiggle the fingers. Neurovascularly intact  Lab Results  Component Value Date   WBC 5.8 03/08/2014   HGB 14.7 03/08/2014   HCT 41.1 03/08/2014   MCV 90.5 03/08/2014   PLT 241 03/08/2014       Component Value Date/Time   NA 141 03/08/2014 1054   K 3.6* 03/08/2014 1054   CL 103 03/08/2014 1054   CO2 23 03/08/2014 1054   GLUCOSE 118* 03/08/2014 1054   BUN 16 03/08/2014 1054   CREATININE 1.19 03/08/2014 1054   CALCIUM 10.1 03/08/2014 1054   GFRNONAA 63* 03/08/2014 1054   GFRAA 73* 03/08/2014 1054    Lab Results  Component Value Date   INR 1.03 06/02/2013   INR 0.99 04/07/2013   INR 1.04 10/07/2011    Assessment/Plan: Stable s/p LEFT REVERSE SHOULDER ARTHROPLASTY D/C likely tomorrow, Rx on chart, med rec done  [employer] Met[employer] CompanySteven R. Ranell PatrickNorris, MD 03/17/2014 5:34 PM

## 2014-03-17 NOTE — Anesthesia Preprocedure Evaluation (Addendum)
Anesthesia Evaluation  Patient identified by MRN, date of birth, ID band Patient awake    Reviewed: Allergy & Precautions, H&P , NPO status , Patient's Chart, lab work & pertinent test results  Airway Mallampati: I TM Distance: >3 FB Neck ROM: Full    Dental  (+) Poor Dentition, Missing   Pulmonary  breath sounds clear to auscultation        Cardiovascular hypertension, Pt. on medications Rhythm:Regular Rate:Normal     Neuro/Psych    GI/Hepatic GERD-  Medicated and Controlled,  Endo/Other  diabetes, Well Controlled, Type 2, Oral Hypoglycemic Agents  Renal/GU      Musculoskeletal   Abdominal   Peds  Hematology   Anesthesia Other Findings   Reproductive/Obstetrics                       Anesthesia Physical Anesthesia Plan  ASA: III  Anesthesia Plan: General and Regional   Post-op Pain Management:    Induction: Intravenous  Airway Management Planned: Oral ETT  Additional Equipment: None  Intra-op Plan:   Post-operative Plan: Extubation in OR  Informed Consent: I have reviewed the patients History and Physical, chart, labs and discussed the procedure including the risks, benefits and alternatives for the proposed anesthesia with the patient or authorized representative who has indicated his/her understanding and acceptance.   Dental advisory given  Plan Discussed with: CRNA, Anesthesiologist and Surgeon  Anesthesia Plan Comments:         Anesthesia Quick Evaluation

## 2014-03-17 NOTE — Interval H&P Note (Signed)
History and Physical Interval Note:  03/17/2014 7:28 AM  Keith Oconnell  has presented today for surgery, with the diagnosis of LEFT SHOULDER ROTATOR CUFF INSUFICIENCY  The various methods of treatment have been discussed with the patient and family. After consideration of risks, benefits and other options for treatment, the patient has consented to  Procedure(s): LEFT REVERSE SHOULDER ARTHROPLASTY (Left) as a surgical intervention .  The patient's history has been reviewed, patient examined, no change in status, stable for surgery.  I have reviewed the patient's chart and labs.  Questions were answered to the patient's satisfaction.     Verlee RossettiSteven R Inola Lisle

## 2014-03-17 NOTE — Brief Op Note (Signed)
03/17/2014  9:42 AM  PATIENT:  Cyndee Brightlyobert S Simkins  63 y.o. male  PRE-OPERATIVE DIAGNOSIS:  LEFT SHOULDER ROTATOR CUFF INSUFFCIENCY, ARTHROPATHY  POST-OPERATIVE DIAGNOSIS:  LEFT SHOULDER ROTATOR CUFF INSUFFICIENCY, ARTHROPATHY  PROCEDURE:  Procedure(s): LEFT REVERSE SHOULDER ARTHROPLASTY (Left) DePuy Delta Xtend  SURGEON:  Surgeon(s) and Role:    * Verlee RossettiSteven R Alekzander Cardell, MD - Primary  PHYSICIAN ASSISTANT:   ASSISTANTS: Thea Gisthomas B Dixon, PA-C   ANESTHESIA:   regional and general  EBL:  Total I/O In: 1400 [I.V.:1400] Out: 150 [Blood:150]  BLOOD ADMINISTERED:none  DRAINS: none   LOCAL MEDICATIONS USED:  MARCAINE     SPECIMEN:  No Specimen  DISPOSITION OF SPECIMEN:  N/A  COUNTS:  YES  TOURNIQUET:  * No tourniquets in log *  DICTATION: .Other Dictation: Dictation Number [id number]  PLAN OF CARE: Admission  PATIENT DISPOSITION:  PACU - hemodynamically stable.   Delay start of Pharmacological VTE agent (>24hrs) due to surgical blood loss or risk of bleeding: not applicable

## 2014-03-17 NOTE — Anesthesia Postprocedure Evaluation (Signed)
  Anesthesia Post-op Note  Patient: Keith BrightlyRobert S Roulhac  Procedure(s) Performed: Procedure(s): LEFT REVERSE SHOULDER ARTHROPLASTY (Left)  Patient Location: PACU  Anesthesia Type:General  Level of Consciousness: awake  Airway and Oxygen Therapy: Patient Spontanous Breathing  Post-op Pain: mild  Post-op Assessment: Post-op Vital signs reviewed  Post-op Vital Signs: Reviewed  Last Vitals:  Filed Vitals:   03/17/14 1037  BP: 102/54  Pulse: 71  Temp: 36.5 C  Resp: 20    Complications: No apparent anesthesia complications

## 2014-03-17 NOTE — Discharge Instructions (Signed)
No Driving.  No push, pull, or lift  Keep incision clean and dry for one week, then ok to get wet in the shower  Ok to move the shoulder as you can.  Use the sling while up and walking around, ok to remove the sling while sitting or reclining.  Prop pillows behind the elbow to keep the arm across the waist  Follow up in two weeks  [phone]

## 2014-03-18 LAB — BASIC METABOLIC PANEL
BUN: 16 mg/dL (ref 6–23)
CALCIUM: 9.1 mg/dL (ref 8.4–10.5)
CO2: 25 meq/L (ref 19–32)
CREATININE: 1.07 mg/dL (ref 0.50–1.35)
Chloride: 102 mEq/L (ref 96–112)
GFR calc Af Amer: 83 mL/min — ABNORMAL LOW (ref >90)
GFR calc non Af Amer: 72 mL/min — ABNORMAL LOW (ref >90)
Glucose, Bld: 158 mg/dL — ABNORMAL HIGH (ref 70–99)
Potassium: 3.8 mEq/L (ref 3.7–5.3)
Sodium: 138 mEq/L (ref 137–147)

## 2014-03-18 LAB — GLUCOSE, CAPILLARY: Glucose-Capillary: 138 mg/dL — ABNORMAL HIGH (ref 70–99)

## 2014-03-18 LAB — HEMOGLOBIN AND HEMATOCRIT, BLOOD
HEMATOCRIT: 34.8 % — AB (ref 39.0–52.0)
Hemoglobin: 11.8 g/dL — ABNORMAL LOW (ref 13.0–17.0)

## 2014-03-18 NOTE — Op Note (Signed)
NAMMarland Kitchen:  Beaulah DinningWILSON, [place]               ACCOUNT NO.:  [id number]  MEDICAL RECORD NO.:  [id number]  LOCATION:  5N08C                        FACILITY:  [facility]  PHYSICIAN:  Almedia BallsSteven R. Ranell PatrickNorris, M.D. DATE OF BIRTH:  06/09/1951  DATE OF PROCEDURE:  03/17/2014 DATE OF DISCHARGE:                              OPERATIVE REPORT   PREOPERATIVE DIAGNOSIS:  Left shoulder rotator cuff tear arthropathy.  POSTOPERATIVE DIAGNOSIS:  Left shoulder rotator cuff tear arthropathy.  PROCEDURE PERFORMED:  Left shoulder reverse total shoulder arthroplasty using DePuy Delta Xtend prosthesis.  ATTENDING SURGEON:  Almedia BallsSteven R. Ranell PatrickNorris, MD.  ASSISTANT:  Donnie Coffinhomas B. Dixon, PA-C, who was scrubbed during the entire procedure and necessary for satisfactory completion of surgery.  ANESTHESIA:  General anesthesia was used plus interscalene block.  ESTIMATED BLOOD LOSS:  Less than 100 mL.  FLUID REPLACEMENT:  Was 1500 mL crystalloid.  INSTRUMENT COUNTS:  Correct.  COMPLICATIONS:  There were no complications.  ANTIBIOTICS:  Perioperative antibiotics were given.  INDICATIONS:  Patient is a 63 year old male who has had a prior work injury to his shoulder.  He had a large rotator cuff repair performed which unfortunately did not result in significant functional improvement.  The patient had progressive pain and decreased function and now has fixed superior head migration, loss of range of motion and function of shoulder and chronic pain.  We discussed options for management with the patient.  MRI scan revealing rotator cuff atrophy and insufficiency.  We discussed the potential for significant pain relief and hopefully some moderate functional gains with a reverse total shoulder arthroplasty.  The limitations of this procedure were discussed in detail with the patient.  Patient elected to proceed with surgical management given an extended periods of failed conservative management. Informed consent was  obtained.  DESCRIPTION OF PROCEDURE:  After adequate level of anesthesia was achieved, the patient was positioned in a modified beach-chair position. Left shoulder was correctly identified.  It was examined under anesthesia, very limited motion noted __________which may be 30 to 40 degrees internal rotation arc.  Passively, I can far elevate about 70 degrees with very firm end point.  After sterile prep and drape of the shoulder and arm, we entered the shoulder in standard deltopectoral incision, started at the coracoid process extending down to the anterior humerus.  Dissection was carried out down through subcutaneous tissues using Bovie electrocautery.  The cephalic vein was identified, taken laterally to the deltoid, pectoralis was taken medially.  Conjoined tendon identified and retracted medially.  We identified the biceps groove.  Divided the soft tissue,  __________ groove and removed subscapularis off the lesser tuberosity subperiosteally were freed that up from underlying capsule.  There was an intact rotator cuff repair, but it basically extends like __________ and was scarred.  There was really no motion at all of the rotator cuff.  We removed that using a Bovie.  Progressively externally rotating released the capsule off the inferior humerus.  We then placed our Brown retractor exposing the humeral head, entered the proximal humerus with the 6 mm reamer and then reamed up to size 14.  We then went ahead and made our head cut with the intramedullary guide, set  on 10 degrees of retroversion.  We then prepared the metaphysis with the metaphyseal reamer, set on __________ left and then reamed.  We removed the extraneous spurs and prominent inferior portion of the humeral head.  We then, with the trial stem in place retracted the humerus posteriorly, did a 360-degree capsular removal and glenoid labrum removal.  We freed up again the subscap to make sure it has nice balance.  We  checked to make sure axillary nerve was protected.  We then went ahead and removed the remaining cartilage off the glenoid, placed our central guide pin for the metaglene preparation and went ahead and reamed for the metaglene.  We did our peripheral reaming by hand.  We got down to nice bleeding bone.  We then drilled our central PEG hole for the metaglene and impacted the real metaglene in position.  We irrigated it thoroughly.  We then placed our screws.  We had 3 locked screws, 36 inferiorly, 36 up into the coracoid base, and 36 anteriorly only, and 18 posteriorly nonlocked.  Had good purchase.  We placed our 42 standard glenoid sphere in place.  We then went ahead and thoroughly irrigated and then trialed with a 42+ 6 trial poly which worked great with nice soft tissue balancing.  Negative sulcus, negative gapping with external rotation.  We removed our trial components on his humeral side, pulse irrigated, bone grafted.  Some small areas with some anchors come out from the prior rotator cuff repair, and then used impaction grafting technique to impact then the 14 stem, hydroxyapatite coated, with the size 2 epi left metaphyseal modular component.  We impacted that into place, giving us 10 degrees retroversion on the humeral component.  We then trialed again with a +6 poly which was perfect and then inserted the real +6 poly, reduced the shoulder, were pleased with soft tissue balancing and stability. Conjoint was tight, no sulcus and no gapping with external rotation.  We repaired the subscapularis anatomically __________ the lesser tuberosity through the bone and then repaired deltopectoral interval with 0 Vicryl suture followed by 2-0 Vicryl subcutaneous closure and 4-0 Monocryl for skin.  Portals and Steri-Strips applied followed by sterile dressing. The patient tolerated the surgery well.     Almedia BallsSteven R. Ranell PatrickNorris, M.D.     SRN/MEDQ  D:  03/17/2014  T:  03/18/2014  Job:   [id number]

## 2014-03-18 NOTE — Progress Notes (Signed)
Subjective: 1 Day Post-Op Procedure(s) (LRB): LEFT REVERSE SHOULDER ARTHROPLASTY (Left) Patient reports pain as mild.  Controlled with oral pain meds.  No n/v.  Tolerating regular diet.  Objective: Vital signs in last 24 hours: Temp:  [97.6 F (36.4 C)-98.2 F (36.8 C)] 97.6 F (36.4 C) (04/18 0439) Pulse Rate:  [54-81] 63 (04/18 0439) Resp:  [15-20] 18 (04/18 0439) BP: (102-141)/(54-71) 141/66 mmHg (04/18 0439) SpO2:  [95 %-100 %] 98 % (04/18 0439) Weight:  [97.977 kg (216 lb)] 97.977 kg (216 lb) (04/17 1942)  Intake/Output from previous day: 04/17 0701 - 04/18 0700 In: 2070 [P.O.:720; I.V.:1200; IV Piggyback:150] Out: 150 [Blood:150] Intake/Output this shift: Total I/O In: 360 [P.O.:360] Out: -    Recent Labs  03/18/14 0615  HGB 11.8*    Recent Labs  03/18/14 0615  HCT 34.8*    Recent Labs  03/18/14 0615  NA 138  K 3.8  CL 102  CO2 25  BUN 16  CREATININE 1.07  GLUCOSE 158*  CALCIUM 9.1   No results found for this basename: LABPT, INR,  in the last 72 hours  PE:  wn wd male in nad.  Dressing c/d/i.  NVI at L UE.  Assessment/Plan: 1 Day Post-Op Procedure(s) (LRB): LEFT REVERSE SHOULDER ARTHROPLASTY (Left) D/c home.  Toni ArthursJohn Lilygrace Rodick 03/18/2014, 9:03 AM

## 2014-03-18 NOTE — Evaluation (Addendum)
Occupational Therapy Evaluation Patient Details Name: Keith BrightlyRobert S Delaughter MRN: [id number] DOB: Sep 15, 1951 Today's Date: 03/18/2014    History of Present Illness Pt admitted for left shoulder reverse total shoulder arthroplasty   Clinical Impression   Pt with 5/10 pain at rest and during OT eval and also slight nausea. Wife present for education and both are familiar with shoulder care info from previous surgery. Reviewed handout and all instructions with pt and wife. No further questions. Practiced donning/doffing sling, button down shirt, UB bathing, and exercises. No further acute OT needs.    Follow Up Recommendations  No OT follow up    Equipment Recommendations  None recommended by OT    Recommendations for Other Services       Precautions / Restrictions Precautions Precautions: Shoulder Shoulder Interventions: Shoulder sling/immobilizer Precaution Booklet Issued: Yes (comment) Precaution Comments: Issued shoulder care handout and reviewed all instructions with pt and wife Restrictions Weight Bearing Restrictions: Yes Other Position/Activity Restrictions: NWB      Mobility Bed Mobility                  Transfers                      Balance                                            ADL                                               Vision                     Perception     Praxis      Pertinent Vitals/Pain 5/10 pain L shoulder; reposition, ice     Hand Dominance     Extremity/Trunk Assessment             Communication     Cognition Arousal/Alertness: Awake/alert Behavior During Therapy: WFL for tasks assessed/performed Overall Cognitive Status: Within Functional Limits for tasks assessed                     General Comments       Exercises   Other Exercises Other Exercises: digit ROM, encouraged pt to do wrist flexion/extension and elbow flexion/extension, pt performed  X 2-3 reps of AAROM shoulder flexion but only tolerated minimal due to pain. AAROM external rotation to about 20 degrees X 2-3 reps due to pain.    Shoulder Instructions Shoulder Instructions Donning/doffing shirt without moving shoulder: Caregiver independent with task Method for sponge bathing under operated UE: Caregiver independent with task Donning/doffing sling/immobilizer: Caregiver independent with task Correct positioning of sling/immobilizer: Caregiver independent with task ROM for elbow, wrist and digits of operated UE: Supervision/safety Sling wearing schedule (on at all times/off for ADL's): Independent;Caregiver independent with task Proper positioning of operated UE when showering: Independent;Caregiver independent with task Positioning of UE while sleeping: Caregiver independent with task;Patient able to independently direct caregiver    Home Living Family/patient expects to be discharged to:: Private residence Living Arrangements: Spouse/significant other  Prior Functioning/Environment               OT Diagnosis:     OT Problem List:     OT Treatment/Interventions:      OT Goals(Current goals can be found in the care plan section) Acute Rehab OT Goals Patient Stated Goal: none stated.   OT Frequency:     Barriers to D/C:            Co-evaluation              End of Session    Activity Tolerance: Patient limited by pain;Other (comment) (slight nausea) Patient left: in chair;with call bell/phone within reach;with family/visitor present   Time: [id number] OT Time Calculation (min): 32 min Charges:  OT General Charges $OT Visit: 1 Procedure OT Evaluation $Initial OT Evaluation Tier I: 1 Procedure OT Treatments $Self Care/Home Management : 8-22 mins $Therapeutic Exercise: 8-22 mins G-Codes:    Lennox LaityStephanie Stafford Mannix Kroeker [phone] 03/18/2014, 9:37 AM

## 2014-03-20 ENCOUNTER — Encounter (HOSPITAL_COMMUNITY): Payer: Self-pay | Admitting: Orthopedic Surgery

## 2014-03-21 NOTE — Discharge Summary (Signed)
Physician Discharge Summary   Patient ID: Keith Oconnell MRN: [id number] DOB/AGE: Mar 15, 1951 63 y.o.  Admit date: 03/17/2014 Discharge date: 03/19/2014  Admission Diagnoses:  Active Problems:   Osteoarthritis of left shoulder due to rotator cuff injury   Discharge Diagnoses:  Same   Surgeries: Procedure(s): LEFT REVERSE SHOULDER ARTHROPLASTY on 03/17/2014   Consultants: PT/OT  Discharged Condition: Stable  Hospital Course: Keith Oconnell is an 63 y.o. male who was admitted 03/17/2014 with a chief complaint of No chief complaint on file. , and found to have a diagnosis of <principal problem not specified>.  They were brought to the operating room on 03/17/2014 and underwent the above named procedures.    The patient had an uncomplicated hospital course and was stable for discharge.  Recent vital signs:  Filed Vitals:   03/18/14 1338  BP: 136/64  Pulse: 61  Temp: 97.7 F (36.5 C)  Resp: 18    Recent laboratory studies:  Results for orders placed during the hospital encounter of 03/17/14  GLUCOSE, CAPILLARY      Result Value Ref Range   Glucose-Capillary 108 (*) 70 - 99 mg/dL  GLUCOSE, CAPILLARY      Result Value Ref Range   Glucose-Capillary 146 (*) 70 - 99 mg/dL  GLUCOSE, CAPILLARY      Result Value Ref Range   Glucose-Capillary 174 (*) 70 - 99 mg/dL  GLUCOSE, CAPILLARY      Result Value Ref Range   Glucose-Capillary 131 (*) 70 - 99 mg/dL   Comment 1 Notify RN     Comment 2 Documented in Chart    HEMOGLOBIN AND HEMATOCRIT, BLOOD      Result Value Ref Range   Hemoglobin 11.8 (*) 13.0 - 17.0 g/dL   HCT 34.8 (*) 39.0 - 78.9 %  BASIC METABOLIC PANEL      Result Value Ref Range   Sodium 138  137 - 147 mEq/L   Potassium 3.8  3.7 - 5.3 mEq/L   Chloride 102  96 - 112 mEq/L   CO2 25  19 - 32 mEq/L   Glucose, Bld 158 (*) 70 - 99 mg/dL   BUN 16  6 - 23 mg/dL   Creatinine, Ser 1.07  0.50 - 1.35 mg/dL   Calcium 9.1  8.4 - 10.5 mg/dL   GFR calc non Af Amer 72 (*)  >90 mL/min   GFR calc Af Amer 83 (*) >90 mL/min  GLUCOSE, CAPILLARY      Result Value Ref Range   Glucose-Capillary 130 (*) 70 - 99 mg/dL  GLUCOSE, CAPILLARY      Result Value Ref Range   Glucose-Capillary 138 (*) 70 - 99 mg/dL    Discharge Medications:     Medication List         acetaminophen 500 MG tablet  Commonly known as:  TYLENOL  Take 500 mg by mouth every 6 (six) hours as needed.     amLODipine 5 MG tablet  Commonly known as:  NORVASC  Take 5 mg by mouth daily with breakfast.     aspirin EC 325 MG tablet  Take 325 mg by mouth daily.     CO Q 10 PO  Take 1 tablet by mouth daily.     fenofibrate micronized 130 MG capsule  Commonly known as:  ANTARA  Take 130 mg by mouth daily before breakfast.     Garlic 3810 MG Caps  Take 1 capsule by mouth daily.     glipiZIDE 5 MG  24 hr tablet  Commonly known as:  GLUCOTROL XL  Take 5 mg by mouth daily with breakfast.     halobetasol 0.05 % cream  Commonly known as:  ULTRAVATE  Apply 1 application topically daily as needed (for psorisis).     ibuprofen 200 MG tablet  Commonly known as:  ADVIL,MOTRIN  Take 200-600 mg by mouth every 6 (six) hours as needed for pain.     lisinopril-hydrochlorothiazide 20-25 MG per tablet  Commonly known as:  PRINZIDE,ZESTORETIC  Take 1 tablet by mouth daily with breakfast.     methocarbamol 500 MG tablet  Commonly known as:  ROBAXIN  Take 1 tablet (500 mg total) by mouth every 6 (six) hours as needed.     methocarbamol 500 MG tablet  Commonly known as:  ROBAXIN  Take 1 tablet (500 mg total) by mouth 3 (three) times daily as needed for muscle spasms.     multivitamins ther. w/minerals Tabs tablet  Take 1 tablet by mouth daily.     omega-3 acid ethyl esters 1 G capsule  Commonly known as:  LOVAZA  Take 1-2 g by mouth 2 (two) times daily. 1 CAPSULES IN THE MORNING AND 2 CAPSULES IN THE EVENING     omeprazole 20 MG tablet  Commonly known as:  PRILOSEC OTC  Take 20 mg by mouth  daily as needed (for reflux).     ONE TOUCH ULTRA 2 W/DEVICE Kit     ONE TOUCH ULTRA TEST test strip  Generic drug:  glucose blood     ONETOUCH DELICA LANCETS 97D Misc     oxyCODONE-acetaminophen 5-325 MG per tablet  Commonly known as:  PERCOCET/ROXICET  Take 1-2 tablets by mouth every 4 (four) hours as needed.     oxyCODONE-acetaminophen 5-325 MG per tablet  Commonly known as:  ROXICET  Take 1-2 tablets by mouth every 4 (four) hours as needed for severe pain.     simvastatin 20 MG tablet  Commonly known as:  ZOCOR  Take 20 mg by mouth every evening.        Diagnostic Studies: Dg Shoulder Left  03/17/2014   CLINICAL DATA:  Total shoulder arthroplasty, immediate postoperative.  EXAM: LEFT SHOULDER - 2+ VIEW  COMPARISON:  None.  FINDINGS: Left total shoulder arthroplasty noted. Laterally in the proximal humerus, there is linear lucency with mild cortical irregularity.  Expected gas in the joint and surrounding soft tissues. Old left rib fracture appears healed. Conjoined left first and second ribs.  IMPRESSION: 1. Linear lucency along the lateral -proximal metaphysis of the proximal humerus may well be artifactual but a fracture is difficult to completely exclude. Consider additional views at mild obliquity to confirm or disprove fracture.   Electronically Signed   By: Sherryl Barters M.D.   On: 03/17/2014 11:27    Disposition: 01-Home or Self Care      Discharge Orders   Future Orders Complete By Expires   Call MD / Call 911  As directed    Constipation Prevention  As directed    Diet - low sodium heart healthy  As directed    Increase activity slowly as tolerated  As directed       Follow-up Information   Follow up with NORRIS,STEVEN R, MD In 2 weeks. [phone]    Specialty:  Orthopedic Surgery   Contact information:   [address] [place] [address] [phone]        Signed: Ventura Bruns 03/21/2014, 8:05 AM

## 2014-03-27 NOTE — Addendum Note (Signed)
Addendum created 03/27/14 1007 by Judie Petitharlene Edwards, MD   Modules edited: Anesthesia Responsible Staff

## 2014-11-15 IMAGING — CR DG SHOULDER 2+V*L*
1 series · 1 of 1 positions shown · non-contrast
Comparison: None.

CLINICAL DATA: Total shoulder arthroplasty, immediate
postoperative.

EXAM:
LEFT SHOULDER - 2+ VIEW

[AP]
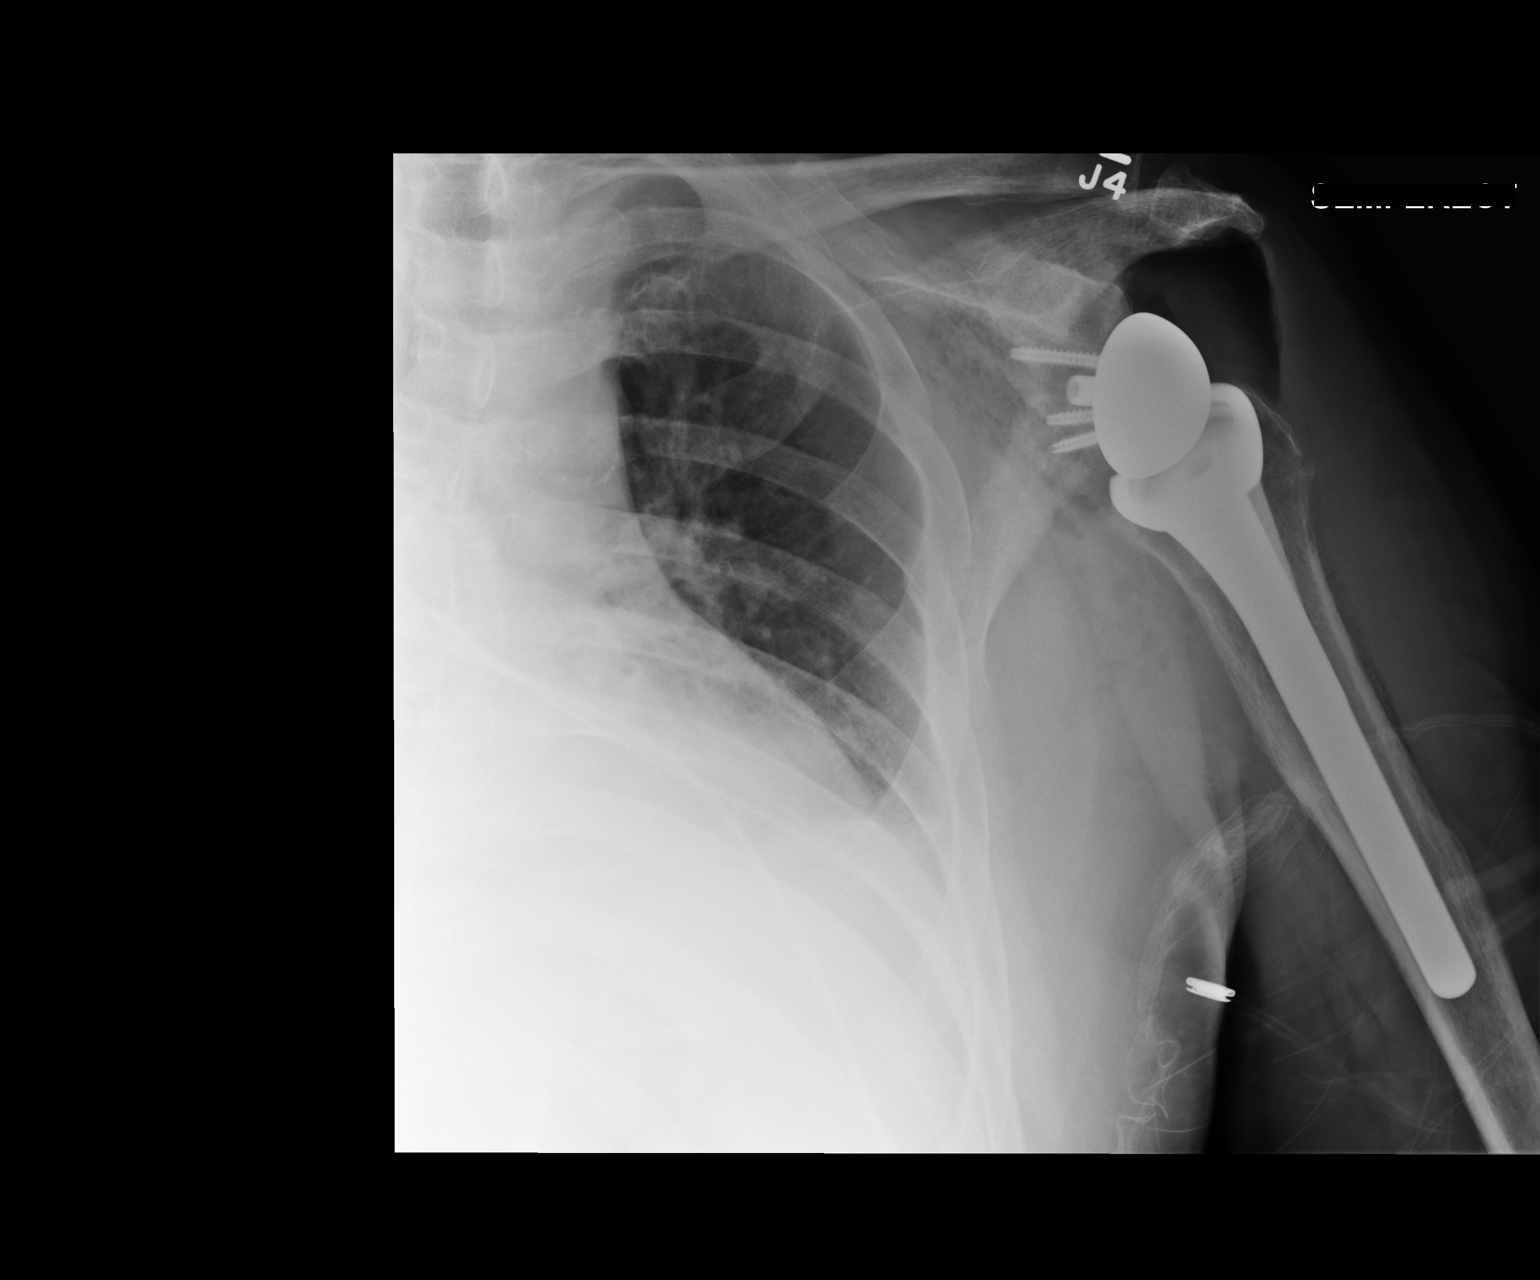

[1 of 1 positions shown; findings below may reference images not displayed]

FINDINGS: Left total shoulder arthroplasty noted. Laterally in the proximal
humerus, there is linear lucency with mild cortical irregularity.

Expected gas in the joint and surrounding soft tissues. Old left rib
fracture appears healed. Conjoined left first and second ribs.
IMPRESSION: 1. Linear lucency along the lateral -proximal metaphysis of the
proximal humerus may well be artifactual but a fracture is difficult
to completely exclude. Consider additional views at mild obliquity
to confirm or disprove fracture.

## 2018-09-09 ENCOUNTER — Other Ambulatory Visit (HOSPITAL_COMMUNITY): Payer: Self-pay | Admitting: Family Medicine

## 2018-09-09 ENCOUNTER — Ambulatory Visit (HOSPITAL_COMMUNITY)
Admission: RE | Admit: 2018-09-09 | Discharge: 2018-09-09 | Disposition: A | Discharge status: Home / Self Care | Payer: Medicare Other | Source: Ambulatory Visit | Attending: Family Medicine | Admitting: Family Medicine

## 2018-09-09 DIAGNOSIS — J189 Pneumonia, unspecified organism: Secondary | ICD-10-CM | POA: Diagnosis not present

## 2020-01-24 DIAGNOSIS — M19012 Primary osteoarthritis, left shoulder: Secondary | ICD-10-CM | POA: Diagnosis not present

## 2020-01-24 DIAGNOSIS — G894 Chronic pain syndrome: Secondary | ICD-10-CM | POA: Diagnosis not present

## 2020-01-24 DIAGNOSIS — M19011 Primary osteoarthritis, right shoulder: Secondary | ICD-10-CM | POA: Diagnosis not present

## 2020-05-23 DIAGNOSIS — M19012 Primary osteoarthritis, left shoulder: Secondary | ICD-10-CM | POA: Diagnosis not present

## 2020-05-23 DIAGNOSIS — M5442 Lumbago with sciatica, left side: Secondary | ICD-10-CM | POA: Diagnosis not present

## 2020-05-23 DIAGNOSIS — M19011 Primary osteoarthritis, right shoulder: Secondary | ICD-10-CM | POA: Diagnosis not present

## 2020-05-23 DIAGNOSIS — E1159 Type 2 diabetes mellitus with other circulatory complications: Secondary | ICD-10-CM | POA: Diagnosis not present

## 2020-05-23 DIAGNOSIS — E1142 Type 2 diabetes mellitus with diabetic polyneuropathy: Secondary | ICD-10-CM | POA: Diagnosis not present

## 2020-06-07 DIAGNOSIS — I152 Hypertension secondary to endocrine disorders: Secondary | ICD-10-CM | POA: Diagnosis not present

## 2020-06-07 DIAGNOSIS — Z20822 Contact with and (suspected) exposure to covid-19: Secondary | ICD-10-CM | POA: Diagnosis not present

## 2020-06-07 DIAGNOSIS — E785 Hyperlipidemia, unspecified: Secondary | ICD-10-CM | POA: Diagnosis not present

## 2020-06-07 DIAGNOSIS — E1142 Type 2 diabetes mellitus with diabetic polyneuropathy: Secondary | ICD-10-CM | POA: Diagnosis not present

## 2020-06-07 DIAGNOSIS — E1169 Type 2 diabetes mellitus with other specified complication: Secondary | ICD-10-CM | POA: Diagnosis not present

## 2020-06-07 DIAGNOSIS — E1159 Type 2 diabetes mellitus with other circulatory complications: Secondary | ICD-10-CM | POA: Diagnosis not present

## 2020-07-09 DIAGNOSIS — M5416 Radiculopathy, lumbar region: Secondary | ICD-10-CM | POA: Diagnosis not present

## 2020-07-09 DIAGNOSIS — M961 Postlaminectomy syndrome, not elsewhere classified: Secondary | ICD-10-CM | POA: Diagnosis not present

## 2020-08-14 DIAGNOSIS — M5136 Other intervertebral disc degeneration, lumbar region: Secondary | ICD-10-CM | POA: Diagnosis not present

## 2020-09-06 DIAGNOSIS — I152 Hypertension secondary to endocrine disorders: Secondary | ICD-10-CM | POA: Diagnosis not present

## 2020-09-06 DIAGNOSIS — E1169 Type 2 diabetes mellitus with other specified complication: Secondary | ICD-10-CM | POA: Diagnosis not present

## 2020-09-06 DIAGNOSIS — E1142 Type 2 diabetes mellitus with diabetic polyneuropathy: Secondary | ICD-10-CM | POA: Diagnosis not present

## 2020-09-06 DIAGNOSIS — E1159 Type 2 diabetes mellitus with other circulatory complications: Secondary | ICD-10-CM | POA: Diagnosis not present

## 2020-09-25 DIAGNOSIS — I152 Hypertension secondary to endocrine disorders: Secondary | ICD-10-CM | POA: Diagnosis not present

## 2020-09-25 DIAGNOSIS — E1159 Type 2 diabetes mellitus with other circulatory complications: Secondary | ICD-10-CM | POA: Diagnosis not present

## 2020-09-25 DIAGNOSIS — M19011 Primary osteoarthritis, right shoulder: Secondary | ICD-10-CM | POA: Diagnosis not present

## 2020-09-25 DIAGNOSIS — E1142 Type 2 diabetes mellitus with diabetic polyneuropathy: Secondary | ICD-10-CM | POA: Diagnosis not present

## 2020-09-25 DIAGNOSIS — M19012 Primary osteoarthritis, left shoulder: Secondary | ICD-10-CM | POA: Diagnosis not present

## 2020-09-26 DIAGNOSIS — Z79891 Long term (current) use of opiate analgesic: Secondary | ICD-10-CM | POA: Diagnosis not present

## 2020-10-01 DIAGNOSIS — M5459 Other low back pain: Secondary | ICD-10-CM | POA: Diagnosis not present

## 2020-10-01 DIAGNOSIS — M5416 Radiculopathy, lumbar region: Secondary | ICD-10-CM | POA: Diagnosis not present

## 2020-10-15 DIAGNOSIS — M5136 Other intervertebral disc degeneration, lumbar region: Secondary | ICD-10-CM | POA: Diagnosis not present

## 2020-10-17 DIAGNOSIS — M961 Postlaminectomy syndrome, not elsewhere classified: Secondary | ICD-10-CM | POA: Diagnosis not present

## 2020-11-05 ENCOUNTER — Other Ambulatory Visit: Payer: Self-pay | Admitting: Neurosurgery

## 2020-11-05 DIAGNOSIS — M5126 Other intervertebral disc displacement, lumbar region: Secondary | ICD-10-CM | POA: Diagnosis not present

## 2020-11-05 DIAGNOSIS — I10 Essential (primary) hypertension: Secondary | ICD-10-CM | POA: Diagnosis not present

## 2020-11-05 DIAGNOSIS — M48062 Spinal stenosis, lumbar region with neurogenic claudication: Secondary | ICD-10-CM | POA: Diagnosis not present

## 2020-11-06 ENCOUNTER — Encounter (HOSPITAL_COMMUNITY): Payer: Self-pay | Admitting: Neurosurgery

## 2020-11-06 ENCOUNTER — Other Ambulatory Visit (HOSPITAL_COMMUNITY)
Admission: RE | Admit: 2020-11-06 | Discharge: 2020-11-06 | Disposition: A | Discharge status: Home / Self Care | Payer: Medicare Other | Source: Ambulatory Visit | Attending: Neurosurgery | Admitting: Neurosurgery

## 2020-11-06 DIAGNOSIS — Z20822 Contact with and (suspected) exposure to covid-19: Secondary | ICD-10-CM | POA: Diagnosis not present

## 2020-11-06 DIAGNOSIS — Z01812 Encounter for preprocedural laboratory examination: Secondary | ICD-10-CM | POA: Diagnosis not present

## 2020-11-06 LAB — SARS CORONAVIRUS 2 (TAT 6-24 HRS): SARS Coronavirus 2: NEGATIVE

## 2020-11-06 NOTE — Progress Notes (Signed)
Spoke with pt for pre-op call. Pt states he has a heart murmur that has never given any problems. Denies any other heart issues. Pt is a type 2 Diabetic. Last A1C was 6.0 on 09/06/20. He states his fasting blood sugar this AM was 107. Instructed pt not to take his Metformin in the AM. Instructed him to check his blood sugar when he gets up and every 2 hours until he leaves for the hospital. If blood sugar is 70 or below, treat with 1/2 cup of clear juice (apple or cranberry) and recheck blood sugar 15 minutes after drinking juice. If blood sugar continues to be 70 or below, call the Short Stay department and ask to speak to a nurse. Pt and wife voice understanding.   Covid test done today, result is pending.  Pt states he's been in quarantine since the test was done and understands that he stays in quarantine until he comes to the hospital tomorrow.  Pt is requesting that his daughter be allowed to come in the hospital with his wife because she can get confused in the hospital and very confused driving in [place]. I told him that I would have someone in the morning to see if this can happen. I have left a note for Cyndia Diver, RN explaining this.

## 2020-11-07 ENCOUNTER — Other Ambulatory Visit: Payer: Self-pay

## 2020-11-07 ENCOUNTER — Ambulatory Visit (HOSPITAL_COMMUNITY): Payer: Medicare Other

## 2020-11-07 ENCOUNTER — Ambulatory Visit (HOSPITAL_COMMUNITY): Payer: Medicare Other | Admitting: Certified Registered Nurse Anesthetist

## 2020-11-07 ENCOUNTER — Observation Stay (HOSPITAL_COMMUNITY)
Admission: RE | Admit: 2020-11-07 | Discharge: 2020-11-08 | Disposition: A | Discharge status: Home / Self Care | Payer: Medicare Other | Attending: Neurosurgery | Admitting: Neurosurgery

## 2020-11-07 ENCOUNTER — Encounter (HOSPITAL_COMMUNITY): Payer: Self-pay | Admitting: Neurosurgery

## 2020-11-07 ENCOUNTER — Encounter (HOSPITAL_COMMUNITY)
Admission: RE | Disposition: A | Discharge status: Home / Self Care | Payer: Self-pay | Source: Home / Self Care | Attending: Neurosurgery

## 2020-11-07 DIAGNOSIS — Z419 Encounter for procedure for purposes other than remedying health state, unspecified: Secondary | ICD-10-CM

## 2020-11-07 DIAGNOSIS — Z7984 Long term (current) use of oral hypoglycemic drugs: Secondary | ICD-10-CM | POA: Diagnosis not present

## 2020-11-07 DIAGNOSIS — Z0389 Encounter for observation for other suspected diseases and conditions ruled out: Secondary | ICD-10-CM | POA: Diagnosis not present

## 2020-11-07 DIAGNOSIS — Z79899 Other long term (current) drug therapy: Secondary | ICD-10-CM | POA: Insufficient documentation

## 2020-11-07 DIAGNOSIS — Z7982 Long term (current) use of aspirin: Secondary | ICD-10-CM | POA: Insufficient documentation

## 2020-11-07 DIAGNOSIS — M48062 Spinal stenosis, lumbar region with neurogenic claudication: Secondary | ICD-10-CM | POA: Diagnosis not present

## 2020-11-07 DIAGNOSIS — I10 Essential (primary) hypertension: Secondary | ICD-10-CM | POA: Diagnosis not present

## 2020-11-07 DIAGNOSIS — E119 Type 2 diabetes mellitus without complications: Secondary | ICD-10-CM | POA: Diagnosis not present

## 2020-11-07 DIAGNOSIS — M4326 Fusion of spine, lumbar region: Secondary | ICD-10-CM | POA: Diagnosis not present

## 2020-11-07 DIAGNOSIS — M532X6 Spinal instabilities, lumbar region: Secondary | ICD-10-CM | POA: Insufficient documentation

## 2020-11-07 DIAGNOSIS — M4696 Unspecified inflammatory spondylopathy, lumbar region: Secondary | ICD-10-CM | POA: Diagnosis not present

## 2020-11-07 DIAGNOSIS — M47816 Spondylosis without myelopathy or radiculopathy, lumbar region: Secondary | ICD-10-CM | POA: Insufficient documentation

## 2020-11-07 HISTORY — PX: LUMBAR LAMINECTOMY/DECOMPRESSION MICRODISCECTOMY: SHX5026

## 2020-11-07 HISTORY — DX: Cardiac murmur, unspecified: R01.1

## 2020-11-07 HISTORY — DX: Pneumonia, unspecified organism: J18.9

## 2020-11-07 LAB — BASIC METABOLIC PANEL
Anion gap: 12 (ref 5–15)
BUN: 17 mg/dL (ref 8–23)
CO2: 21 mmol/L — ABNORMAL LOW (ref 22–32)
Calcium: 9.5 mg/dL (ref 8.9–10.3)
Chloride: 108 mmol/L (ref 98–111)
Creatinine, Ser: 1.05 mg/dL (ref 0.61–1.24)
GFR, Estimated: >60 mL/min (ref >60)
Glucose, Bld: 114 mg/dL — ABNORMAL HIGH (ref 70–99)
Potassium: 3.2 mmol/L — ABNORMAL LOW (ref 3.5–5.1)
Sodium: 141 mmol/L (ref 135–145)

## 2020-11-07 LAB — GLUCOSE, CAPILLARY
Glucose-Capillary: 113 mg/dL — ABNORMAL HIGH (ref 70–99)
Glucose-Capillary: 136 mg/dL — ABNORMAL HIGH (ref 70–99)
Glucose-Capillary: 259 mg/dL — ABNORMAL HIGH (ref 70–99)

## 2020-11-07 LAB — CBC
HCT: 37.9 % — ABNORMAL LOW (ref 39.0–52.0)
Hemoglobin: 12 g/dL — ABNORMAL LOW (ref 13.0–17.0)
MCH: 30.5 pg (ref 26.0–34.0)
MCHC: 31.7 g/dL (ref 30.0–36.0)
MCV: 96.2 fL (ref 80.0–100.0)
Platelets: 224 10*3/uL (ref 150–400)
RBC: 3.94 MIL/uL — ABNORMAL LOW (ref 4.22–5.81)
RDW: 14 % (ref 11.5–15.5)
WBC: 5.4 10*3/uL (ref 4.0–10.5)
nRBC: 0 % (ref 0.0–0.2)

## 2020-11-07 LAB — HEMOGLOBIN A1C
Hgb A1c MFr Bld: 5.9 % — ABNORMAL HIGH (ref 4.8–5.6)
Mean Plasma Glucose: 122.63 mg/dL

## 2020-11-07 SURGERY — LUMBAR LAMINECTOMY/DECOMPRESSION MICRODISCECTOMY 1 LEVEL
Anesthesia: General | Site: Spine Lumbar

## 2020-11-07 MED ORDER — DEXAMETHASONE SODIUM PHOSPHATE 10 MG/ML IJ SOLN
INTRAMUSCULAR | Status: DC | PRN
  Administered 2020-11-07: 5 mg via INTRAVENOUS

## 2020-11-07 MED ORDER — FENOFIBRATE 160 MG PO TABS
160.0000 mg | ORAL_TABLET | Freq: Every day | ORAL | Status: DC
  Filled 2020-11-07: qty 1

## 2020-11-07 MED ORDER — CHLORHEXIDINE GLUCONATE 0.12 % MT SOLN: 15.0000 mL | Freq: Once | OROMUCOSAL | Status: AC

## 2020-11-07 MED ORDER — MIDAZOLAM HCL 5 MG/5ML IJ SOLN
INTRAMUSCULAR | Status: DC | PRN
  Administered 2020-11-07 (×2): 1 mg via INTRAVENOUS

## 2020-11-07 MED ORDER — CHLORHEXIDINE GLUCONATE 0.12 % MT SOLN
OROMUCOSAL | Status: AC
  Administered 2020-11-07: 15 mL via OROMUCOSAL
  Filled 2020-11-07: qty 15

## 2020-11-07 MED ORDER — AMLODIPINE BESYLATE 5 MG PO TABS
10.0000 mg | ORAL_TABLET | Freq: Every day | ORAL | Status: DC
  Administered 2020-11-07: 10 mg via ORAL
  Filled 2020-11-07: qty 2

## 2020-11-07 MED ORDER — THROMBIN (RECOMBINANT) 5000 UNITS EX SOLR
CUTANEOUS | Status: AC
  Filled 2020-11-07: qty 10000

## 2020-11-07 MED ORDER — HYDROCHLOROTHIAZIDE 25 MG PO TABS
25.0000 mg | ORAL_TABLET | Freq: Every day | ORAL | Status: DC
  Administered 2020-11-08: 25 mg via ORAL
  Filled 2020-11-07: qty 1

## 2020-11-07 MED ORDER — SENNA 8.6 MG PO TABS
1.0000 | ORAL_TABLET | Freq: Two times a day (BID) | ORAL | Status: DC
  Administered 2020-11-07 – 2020-11-08 (×2): 8.6 mg via ORAL
  Filled 2020-11-07 (×2): qty 1

## 2020-11-07 MED ORDER — MAGNESIUM OXIDE 400 (241.3 MG) MG PO TABS
200.0000 mg | ORAL_TABLET | Freq: Every day | ORAL | Status: DC
  Administered 2020-11-08: 200 mg via ORAL
  Filled 2020-11-07: qty 1

## 2020-11-07 MED ORDER — LIDOCAINE HCL (PF) 2 % IJ SOLN
INTRAMUSCULAR | Status: AC
  Filled 2020-11-07: qty 5

## 2020-11-07 MED ORDER — ONDANSETRON HCL 4 MG/2ML IJ SOLN
INTRAMUSCULAR | Status: DC | PRN
  Administered 2020-11-07: 4 mg via INTRAVENOUS

## 2020-11-07 MED ORDER — DEXAMETHASONE SODIUM PHOSPHATE 10 MG/ML IJ SOLN
INTRAMUSCULAR | Status: AC
  Filled 2020-11-07: qty 2

## 2020-11-07 MED ORDER — PHENYLEPHRINE HCL-NACL 10-0.9 MG/250ML-% IV SOLN
INTRAVENOUS | Status: DC | PRN
  Administered 2020-11-07: 15 ug/min via INTRAVENOUS

## 2020-11-07 MED ORDER — METFORMIN HCL ER 500 MG PO TB24
500.0000 mg | ORAL_TABLET | Freq: Every day | ORAL | Status: DC
  Administered 2020-11-08: 500 mg via ORAL
  Filled 2020-11-07: qty 1

## 2020-11-07 MED ORDER — MEPERIDINE HCL 25 MG/ML IJ SOLN: 6.2500 mg | INTRAMUSCULAR | Status: DC | PRN

## 2020-11-07 MED ORDER — ORAL CARE MOUTH RINSE: 15.0000 mL | Freq: Once | OROMUCOSAL | Status: AC

## 2020-11-07 MED ORDER — DIAZEPAM 5 MG PO TABS
ORAL_TABLET | ORAL | Status: AC
  Filled 2020-11-07: qty 1

## 2020-11-07 MED ORDER — CEFAZOLIN SODIUM-DEXTROSE 2-4 GM/100ML-% IV SOLN
2.0000 g | INTRAVENOUS | Status: AC
  Administered 2020-11-07: 2 g via INTRAVENOUS

## 2020-11-07 MED ORDER — LOSARTAN POTASSIUM-HCTZ 100-25 MG PO TABS: 1.0000 | ORAL_TABLET | Freq: Every day | ORAL | Status: DC

## 2020-11-07 MED ORDER — THROMBIN 5000 UNITS EX SOLR
CUTANEOUS | Status: AC
  Filled 2020-11-07: qty 10000

## 2020-11-07 MED ORDER — HEPARIN SODIUM (PORCINE) 5000 UNIT/ML IJ SOLN
5000.0000 [IU] | Freq: Three times a day (TID) | INTRAMUSCULAR | Status: DC
  Administered 2020-11-08: 5000 [IU] via SUBCUTANEOUS
  Filled 2020-11-07 (×2): qty 1

## 2020-11-07 MED ORDER — CEFAZOLIN SODIUM-DEXTROSE 2-4 GM/100ML-% IV SOLN
INTRAVENOUS | Status: AC
  Filled 2020-11-07: qty 100

## 2020-11-07 MED ORDER — INSULIN ASPART 100 UNIT/ML ~~LOC~~ SOLN: 0.0000 [IU] | SUBCUTANEOUS | Status: DC

## 2020-11-07 MED ORDER — ROCURONIUM BROMIDE 10 MG/ML (PF) SYRINGE
PREFILLED_SYRINGE | INTRAVENOUS | Status: AC
  Filled 2020-11-07: qty 30

## 2020-11-07 MED ORDER — GABAPENTIN 400 MG PO CAPS
400.0000 mg | ORAL_CAPSULE | Freq: Every day | ORAL | Status: DC
  Administered 2020-11-07: 400 mg via ORAL
  Filled 2020-11-07: qty 1

## 2020-11-07 MED ORDER — ADULT MULTIVITAMIN W/MINERALS CH
1.0000 | ORAL_TABLET | Freq: Every day | ORAL | Status: DC
  Administered 2020-11-08: 1 via ORAL
  Filled 2020-11-07: qty 1

## 2020-11-07 MED ORDER — SUGAMMADEX SODIUM 200 MG/2ML IV SOLN
INTRAVENOUS | Status: DC | PRN
  Administered 2020-11-07: 200 mg via INTRAVENOUS

## 2020-11-07 MED ORDER — BISACODYL 5 MG PO TBEC: 5.0000 mg | DELAYED_RELEASE_TABLET | Freq: Every day | ORAL | Status: DC | PRN

## 2020-11-07 MED ORDER — OXYCODONE HCL 5 MG PO TABS
5.0000 mg | ORAL_TABLET | ORAL | Status: DC | PRN
  Administered 2020-11-08: 5 mg via ORAL
  Filled 2020-11-07: qty 1

## 2020-11-07 MED ORDER — MAGNESIUM CITRATE PO SOLN: 1.0000 | Freq: Once | ORAL | Status: DC | PRN

## 2020-11-07 MED ORDER — OMEPRAZOLE MAGNESIUM 20 MG PO TBEC: 20.0000 mg | DELAYED_RELEASE_TABLET | Freq: Every day | ORAL | Status: DC | PRN

## 2020-11-07 MED ORDER — CO Q 10 10 MG PO CAPS: 1.0000 | ORAL_CAPSULE | Freq: Every day | ORAL | Status: DC

## 2020-11-07 MED ORDER — ACETAMINOPHEN 325 MG PO TABS
650.0000 mg | ORAL_TABLET | ORAL | Status: DC | PRN
  Administered 2020-11-08 (×2): 650 mg via ORAL
  Filled 2020-11-07 (×2): qty 2

## 2020-11-07 MED ORDER — PHENOL 1.4 % MT LIQD: 1.0000 | OROMUCOSAL | Status: DC | PRN

## 2020-11-07 MED ORDER — ACETAMINOPHEN 650 MG RE SUPP: 650.0000 mg | RECTAL | Status: DC | PRN

## 2020-11-07 MED ORDER — VITAMIN D 25 MCG (1000 UNIT) PO TABS
1000.0000 [IU] | ORAL_TABLET | Freq: Every day | ORAL | Status: DC
  Administered 2020-11-08: 1000 [IU] via ORAL
  Filled 2020-11-07 (×3): qty 1

## 2020-11-07 MED ORDER — ROCURONIUM BROMIDE 10 MG/ML (PF) SYRINGE
PREFILLED_SYRINGE | INTRAVENOUS | Status: DC | PRN
  Administered 2020-11-07: 70 mg via INTRAVENOUS

## 2020-11-07 MED ORDER — FENTANYL CITRATE (PF) 100 MCG/2ML IJ SOLN
INTRAMUSCULAR | Status: DC | PRN
  Administered 2020-11-07: 50 ug via INTRAVENOUS
  Administered 2020-11-07: 100 ug via INTRAVENOUS
  Administered 2020-11-07: 50 ug via INTRAVENOUS

## 2020-11-07 MED ORDER — FENTANYL CITRATE (PF) 250 MCG/5ML IJ SOLN
INTRAMUSCULAR | Status: AC
  Filled 2020-11-07: qty 5

## 2020-11-07 MED ORDER — BUPIVACAINE HCL (PF) 0.25 % IJ SOLN
INTRAMUSCULAR | Status: AC
  Filled 2020-11-07: qty 30

## 2020-11-07 MED ORDER — CHLORHEXIDINE GLUCONATE CLOTH 2 % EX PADS: 6.0000 | MEDICATED_PAD | Freq: Once | CUTANEOUS | Status: DC

## 2020-11-07 MED ORDER — OXYCODONE HCL 5 MG/5ML PO SOLN: 5.0000 mg | Freq: Once | ORAL | Status: DC | PRN

## 2020-11-07 MED ORDER — LABETALOL HCL 5 MG/ML IV SOLN
INTRAVENOUS | Status: AC
  Filled 2020-11-07: qty 4

## 2020-11-07 MED ORDER — CELECOXIB 200 MG PO CAPS
200.0000 mg | ORAL_CAPSULE | Freq: Two times a day (BID) | ORAL | Status: DC
  Administered 2020-11-07 – 2020-11-08 (×2): 200 mg via ORAL
  Filled 2020-11-07 (×2): qty 1

## 2020-11-07 MED ORDER — ONDANSETRON HCL 4 MG PO TABS: 4.0000 mg | ORAL_TABLET | Freq: Four times a day (QID) | ORAL | Status: DC | PRN

## 2020-11-07 MED ORDER — PRAVASTATIN SODIUM 40 MG PO TABS
40.0000 mg | ORAL_TABLET | Freq: Every day | ORAL | Status: DC
  Administered 2020-11-07: 40 mg via ORAL
  Filled 2020-11-07: qty 1

## 2020-11-07 MED ORDER — INSULIN ASPART 100 UNIT/ML ~~LOC~~ SOLN
0.0000 [IU] | Freq: Three times a day (TID) | SUBCUTANEOUS | Status: DC
  Administered 2020-11-08: 2 [IU] via SUBCUTANEOUS

## 2020-11-07 MED ORDER — MENTHOL 3 MG MT LOZG
1.0000 | LOZENGE | OROMUCOSAL | Status: DC | PRN
  Filled 2020-11-07: qty 9

## 2020-11-07 MED ORDER — LIDOCAINE-EPINEPHRINE 0.5 %-1:200000 IJ SOLN
INTRAMUSCULAR | Status: AC
  Filled 2020-11-07: qty 1

## 2020-11-07 MED ORDER — 0.9 % SODIUM CHLORIDE (POUR BTL) OPTIME
TOPICAL | Status: DC | PRN
  Administered 2020-11-07: 1000 mL

## 2020-11-07 MED ORDER — OXYCODONE HCL 5 MG PO TABS
10.0000 mg | ORAL_TABLET | ORAL | Status: DC | PRN
  Administered 2020-11-07 – 2020-11-08 (×4): 10 mg via ORAL
  Filled 2020-11-07 (×4): qty 2

## 2020-11-07 MED ORDER — HEMOSTATIC AGENTS (NO CHARGE) OPTIME
TOPICAL | Status: DC | PRN
  Administered 2020-11-07: 1 via TOPICAL

## 2020-11-07 MED ORDER — ACETAMINOPHEN 500 MG PO TABS: 1000.0000 mg | ORAL_TABLET | Freq: Once | ORAL | Status: DC

## 2020-11-07 MED ORDER — METFORMIN HCL ER 500 MG PO TB24
1000.0000 mg | ORAL_TABLET | Freq: Every day | ORAL | Status: DC
  Administered 2020-11-07: 1000 mg via ORAL
  Filled 2020-11-07: qty 2

## 2020-11-07 MED ORDER — DIAZEPAM 5 MG PO TABS
5.0000 mg | ORAL_TABLET | Freq: Four times a day (QID) | ORAL | Status: DC | PRN
  Administered 2020-11-07 – 2020-11-08 (×2): 5 mg via ORAL
  Filled 2020-11-07 (×2): qty 1

## 2020-11-07 MED ORDER — SENNOSIDES-DOCUSATE SODIUM 8.6-50 MG PO TABS: 1.0000 | ORAL_TABLET | Freq: Every evening | ORAL | Status: DC | PRN

## 2020-11-07 MED ORDER — POTASSIUM CHLORIDE IN NACL 20-0.9 MEQ/L-% IV SOLN: INTRAVENOUS | Status: DC

## 2020-11-07 MED ORDER — ONDANSETRON HCL 4 MG/2ML IJ SOLN
INTRAMUSCULAR | Status: AC
  Filled 2020-11-07: qty 4

## 2020-11-07 MED ORDER — THROMBIN 5000 UNITS EX SOLR
CUTANEOUS | Status: DC | PRN
  Administered 2020-11-07: 10000 [IU] via TOPICAL

## 2020-11-07 MED ORDER — LACTATED RINGERS IV SOLN: INTRAVENOUS | Status: DC

## 2020-11-07 MED ORDER — PANTOPRAZOLE SODIUM 40 MG PO TBEC
40.0000 mg | DELAYED_RELEASE_TABLET | Freq: Every day | ORAL | Status: DC | PRN
  Administered 2020-11-07 – 2020-11-08 (×2): 40 mg via ORAL
  Filled 2020-11-07 (×2): qty 1

## 2020-11-07 MED ORDER — SODIUM CHLORIDE 0.9% FLUSH
3.0000 mL | Freq: Two times a day (BID) | INTRAVENOUS | Status: DC
  Administered 2020-11-07: 3 mL via INTRAVENOUS

## 2020-11-07 MED ORDER — ASPIRIN EC 81 MG PO TBEC
81.0000 mg | DELAYED_RELEASE_TABLET | Freq: Every day | ORAL | Status: DC
  Administered 2020-11-07 – 2020-11-08 (×2): 81 mg via ORAL
  Filled 2020-11-07 (×2): qty 1

## 2020-11-07 MED ORDER — HYDROMORPHONE HCL 1 MG/ML IJ SOLN: 0.2500 mg | INTRAMUSCULAR | Status: DC | PRN

## 2020-11-07 MED ORDER — MIDAZOLAM HCL 2 MG/2ML IJ SOLN: 0.5000 mg | Freq: Once | INTRAMUSCULAR | Status: DC | PRN

## 2020-11-07 MED ORDER — PROPOFOL 10 MG/ML IV BOLUS
INTRAVENOUS | Status: DC | PRN
  Administered 2020-11-07: 200 mg via INTRAVENOUS

## 2020-11-07 MED ORDER — OXYCODONE HCL 5 MG PO TABS: 5.0000 mg | ORAL_TABLET | Freq: Once | ORAL | Status: DC | PRN

## 2020-11-07 MED ORDER — LIDOCAINE HCL (CARDIAC) PF 100 MG/5ML IV SOSY
PREFILLED_SYRINGE | INTRAVENOUS | Status: DC | PRN
  Administered 2020-11-07: 40 mg via INTRAVENOUS

## 2020-11-07 MED ORDER — PROMETHAZINE HCL 25 MG/ML IJ SOLN: 6.2500 mg | INTRAMUSCULAR | Status: DC | PRN

## 2020-11-07 MED ORDER — SODIUM CHLORIDE 0.9% FLUSH: 3.0000 mL | INTRAVENOUS | Status: DC | PRN

## 2020-11-07 MED ORDER — LOSARTAN POTASSIUM 50 MG PO TABS
100.0000 mg | ORAL_TABLET | Freq: Every day | ORAL | Status: DC
  Administered 2020-11-08: 100 mg via ORAL
  Filled 2020-11-07: qty 2

## 2020-11-07 MED ORDER — LIDOCAINE-EPINEPHRINE 0.5 %-1:200000 IJ SOLN
INTRAMUSCULAR | Status: DC | PRN
  Administered 2020-11-07: 6 mL

## 2020-11-07 MED ORDER — SODIUM CHLORIDE 0.9 % IV SOLN: 250.0000 mL | INTRAVENOUS | Status: DC

## 2020-11-07 MED ORDER — BUPIVACAINE HCL (PF) 0.25 % IJ SOLN
INTRAMUSCULAR | Status: DC | PRN
  Administered 2020-11-07: 30 mL

## 2020-11-07 MED ORDER — ONDANSETRON HCL 4 MG/2ML IJ SOLN: 4.0000 mg | Freq: Four times a day (QID) | INTRAMUSCULAR | Status: DC | PRN

## 2020-11-07 SURGICAL SUPPLY — 70 items
ADH SKN CLS APL DERMABOND .7 (GAUZE/BANDAGES/DRESSINGS) ×1
APL SKNCLS STERI-STRIP NONHPOA (GAUZE/BANDAGES/DRESSINGS)
BAND INSRT 18 STRL LF DISP RB (MISCELLANEOUS) ×2
BAND RUBBER #18 3X1/16 STRL (MISCELLANEOUS) ×6 IMPLANT
BENZOIN TINCTURE PRP APPL 2/3 (GAUZE/BANDAGES/DRESSINGS) IMPLANT
BIT DRILL PLIF MAS DISP 5.5MM (DRILL) IMPLANT
BLADE CLIPPER SURG (BLADE) IMPLANT
BUR MATCHSTICK NEURO 3.0 LAGG (BURR) ×3 IMPLANT
BUR PRECISION FLUTE 5.0 (BURR) IMPLANT
CAGE COROENT 13X9X28-4 (Cage) ×2 IMPLANT
CAGE COROENT 13X9X28MM-4 (Cage) ×2 IMPLANT
CAGE COROENT 9X9X28M-4 (Cage) IMPLANT
CANISTER SUCT 3000ML PPV (MISCELLANEOUS) ×3 IMPLANT
CAP RELINE MOD TULIP RMM (Cap) ×8 IMPLANT
CARTRIDGE OIL MAESTRO DRILL (MISCELLANEOUS) ×1 IMPLANT
CLOSURE WOUND 1/2 X4 (GAUZE/BANDAGES/DRESSINGS)
CNTNR URN SCR LID CUP LEK RST (MISCELLANEOUS) IMPLANT
CONT SPEC 4OZ STRL OR WHT (MISCELLANEOUS) ×3
COVER BACK TABLE 60X90IN (DRAPES) ×2 IMPLANT
COVER WAND RF STERILE (DRAPES) ×3 IMPLANT
DECANTER SPIKE VIAL GLASS SM (MISCELLANEOUS) ×3 IMPLANT
DERMABOND ADVANCED (GAUZE/BANDAGES/DRESSINGS) ×2
DERMABOND ADVANCED .7 DNX12 (GAUZE/BANDAGES/DRESSINGS) ×1 IMPLANT
DIFFUSER DRILL AIR PNEUMATIC (MISCELLANEOUS) ×3 IMPLANT
DRAPE C-ARM 42X72 X-RAY (DRAPES) ×2 IMPLANT
DRAPE C-ARMOR (DRAPES) ×2 IMPLANT
DRAPE LAPAROTOMY 100X72X124 (DRAPES) ×3 IMPLANT
DRAPE MICROSCOPE LEICA (MISCELLANEOUS) ×3 IMPLANT
DRAPE SURG 17X23 STRL (DRAPES) ×3 IMPLANT
DRILL PLIF MAS DISP 5.5MM (DRILL) ×3
DRSG OPSITE POSTOP 4X6 (GAUZE/BANDAGES/DRESSINGS) ×2 IMPLANT
DURAPREP 26ML APPLICATOR (WOUND CARE) ×3 IMPLANT
ELECT REM PT RETURN 9FT ADLT (ELECTROSURGICAL) ×3
ELECTRODE REM PT RTRN 9FT ADLT (ELECTROSURGICAL) ×1 IMPLANT
GAUZE 4X4 16PLY RFD (DISPOSABLE) IMPLANT
GAUZE SPONGE 4X4 12PLY STRL (GAUZE/BANDAGES/DRESSINGS) IMPLANT
GLOVE BIOGEL PI IND STRL 8 (GLOVE) IMPLANT
GLOVE BIOGEL PI INDICATOR 8 (GLOVE) ×8
GLOVE ECLIPSE 6.5 STRL STRAW (GLOVE) ×5 IMPLANT
GLOVE EXAM NITRILE XL STR (GLOVE) IMPLANT
GOWN STRL REUS W/ TWL LRG LVL3 (GOWN DISPOSABLE) ×2 IMPLANT
GOWN STRL REUS W/ TWL XL LVL3 (GOWN DISPOSABLE) IMPLANT
GOWN STRL REUS W/TWL 2XL LVL3 (GOWN DISPOSABLE) ×2 IMPLANT
GOWN STRL REUS W/TWL LRG LVL3 (GOWN DISPOSABLE) ×6
GOWN STRL REUS W/TWL XL LVL3 (GOWN DISPOSABLE)
KIT BASIN OR (CUSTOM PROCEDURE TRAY) ×3 IMPLANT
KIT TURNOVER KIT B (KITS) ×3 IMPLANT
MILL MEDIUM DISP (BLADE) ×2 IMPLANT
NDL HYPO 25X1 1.5 SAFETY (NEEDLE) ×1 IMPLANT
NDL SPNL 18GX3.5 QUINCKE PK (NEEDLE) IMPLANT
NEEDLE HYPO 25X1 1.5 SAFETY (NEEDLE) ×3 IMPLANT
NEEDLE SPNL 18GX3.5 QUINCKE PK (NEEDLE) IMPLANT
NS IRRIG 1000ML POUR BTL (IV SOLUTION) ×3 IMPLANT
OIL CARTRIDGE MAESTRO DRILL (MISCELLANEOUS) ×3
PACK LAMINECTOMY NEURO (CUSTOM PROCEDURE TRAY) ×3 IMPLANT
PAD ARMBOARD 7.5X6 YLW CONV (MISCELLANEOUS) ×9 IMPLANT
ROD RELINE COCR LORD 5X40MM (Rod) ×4 IMPLANT
SCREW LOCK RSS 4.5/5.0MM (Screw) ×8 IMPLANT
SCREW SHANK RELINE MOD 5.5X35 (Screw) ×4 IMPLANT
SCREW SHANK RELINE MOD 6.5X35 (Screw) ×4 IMPLANT
SPONGE LAP 4X18 RFD (DISPOSABLE) IMPLANT
SPONGE SURGIFOAM ABS GEL SZ50 (HEMOSTASIS) ×3 IMPLANT
STRIP CLOSURE SKIN 1/2X4 (GAUZE/BANDAGES/DRESSINGS) IMPLANT
SUT VIC AB 0 CT1 18XCR BRD8 (SUTURE) ×1 IMPLANT
SUT VIC AB 0 CT1 8-18 (SUTURE) ×6
SUT VIC AB 2-0 CT1 18 (SUTURE) ×3 IMPLANT
SUT VIC AB 3-0 SH 8-18 (SUTURE) ×5 IMPLANT
TOWEL GREEN STERILE (TOWEL DISPOSABLE) ×3 IMPLANT
TOWEL GREEN STERILE FF (TOWEL DISPOSABLE) ×3 IMPLANT
WATER STERILE IRR 1000ML POUR (IV SOLUTION) ×3 IMPLANT

## 2020-11-07 NOTE — Op Note (Signed)
11/07/2020  5:07 PM  PATIENT:  Keith Oconnell  69 y.o. male  PRE-OPERATIVE DIAGNOSIS:  Lumbar stenosis with neurogenic claudication  POST-OPERATIVE DIAGNOSIS:  Lumbar stenosis with neurogenic claudication Facet arthropathy and embarrasment PROCEDURE:  Procedure(s): Lumbar 4-5 Plif Non segmental posterior instrumentation  SURGEON:  Surgeon(s): Coletta Memos, MD  ASSISTANTS:none  ANESTHESIA:   general  EBL:  Total I/O In: 1300 [I.V.:1200; IV Piggyback:100] Out: 300 [Blood:300]  BLOOD ADMINISTERED:none  CELL SAVER GIVEN:none  COUNT:per nursing  DRAINS: none   SPECIMEN:  No Specimen  DICTATION: [name] is a 69 y.o. male whom was taken to the operating room intubated, and placed under a general anesthetic without difficulty. He was Rehman frame with all pressure points properly padded.  His lumbar region was prepped and draped in a sterile manner. I infiltrated 20cc's 1/2%lidocaine/1:2000,000 strength epinephrine into the planned incision. I opened the skin with a 10 blade and took the incision down to the thoracolumbar fascia. I exposed the lamina of L4 and L5 in a subperiosteal fashion bilaterally. I confirmed my location with an intraoperative xray.  I placed self retaining retractors and started the decompression.  I decompressed the spinal canal via a near complete laminectomy of L4. I noted during the decompression that his facets were incompetent, and there was significant movement of the facets. This was certainly indicative of intrinsic instability which would only be worse once the decompression was completed. Thus I made the intraoperative decision to perform an instrumented arthrodesis after decompressing the spinal canal and the L4, and L5 nerve roots.  I used the drill and Kerrison punches to remove the lamina and the ligamentum flavum. This allowed for the canal to be decompressed. I also performed L4 complete inferior facetectomies, and partial L5 superior  facetectomies. This was well beyond the needed exposure for a Plif bilaterally.  PLIF's were performed at L4/5 in the same fashion. I opened the disc space with a 15 blade then used a variety of instruments to remove the disc and prepare the space for the arthrodesis. I used curettes, rongeurs, punches, shavers for the disc space, and rasps in the discetomy. I measured the disc space and placed Peek 50mm x45mm cages filled with autograft morsels (Nuvasive) into the disc space(s).   I placed pedicle screws at L4 and L5, using fluoroscopic guidance. I drilled a pilot hole, then cannulated the pedicle with a drill at each site. I then tapped each pedicle, assessing each site for pedicle violations. No cutouts were appreciated. Screws (nuvasive) were then placed at each site without difficulty. 6.5 x20mm screws were placed at L4, and 5.41mm x29mm screws at L5.I attached rods and locking caps with the appropriate tools. The locking caps were secured with torque limited screwdrivers. Final films were performed and the final construct appeared to be in good position.  I closed the wound in a layered fashion. I approximated the thoracolumbar fascia, subcutaneous, and subcuticular planes with vicryl sutures. I used dermabond and an occlusive bandage for a sterile dressing. I infiltrated 30cc marcaine into the paraspinous musculature prior to closure.    PLAN OF CARE: Admit to inpatient   PATIENT DISPOSITION:  PACU - hemodynamically stable.   Delay start of Pharmacological VTE agent (>24hrs) due to surgical blood loss or risk of bleeding:  yes

## 2020-11-07 NOTE — H&P (Signed)
  There were no vitals taken for this visit.    Keith Oconnell comes in today for evaluation of pain he has when walking, standing, and that he has had for the last few months.  Keith Oconnell is a gentleman whom I had taken to the operating room about 20 years ago for a herniated disc, and he did well after that.  He has been having these problems now for some time.  He did undergo an MRI and was sent to me after the MRI showed fairly severe stenosis at L4-5.  Keith Oconnell weighs 203 pounds.  Temperature is 97.2, blood pressure is 181/79, pulse is 76.  Pain is 9/10.  BMI is 29.16.  He takes amlodipine, fenofibrate, fish oil, gabapentin, Hydrocodone 5, Losartan, Metformin, Multivitamin, Pravastatin, Prilosec, Vitamin D.  He has no known drug allergies.  He does have a history of hypertension, myocardial infarction, diabetes.  He is 18 and retired, right-handed. Does not smoke.  No alcohol use.  No history of substance abuse.  He also uses Tizanidine.  Mother and father both deceased, COPD and hypertension present.  Diabetes also present, and hypertriglyceridemia.  In his words, he has severe pain in the lower back, left leg; has had it for about 5 months.  Pain pill at night helps some. Pain is in the legs and back, weakness in the left lower extremity. Pain has gotten worse since he had the MRI.  He has a balance problem, heart murmur, hypercholesterolemia, swelling in the feet, leg pain with walking, joint pain, skin disease, leg pain, back pain, leg weakness, arm weakness, arthritis.     On exam, he is alert, oriented x4, and he answers all questions appropriately.  Gait is markedly antalgic.  He has 5/5 strength on manual exam in both the upper and lower extremities.  Proprioception is intact in the upper and lower extremities.  Normal muscle tone, bulk, and coordination.  Reflexes are trace at the knees, not elicited at the ankles, 1+ to 2+ in the biceps, triceps, brachioradialis.  He has no clonus.  He has no  Hoffmann sign.  Pupils equal, round, and reactive to light.  Full extraocular movements.  Full visual fields.  Hearing intact to finger rub.  Symmetric facial movements.  Shoulder shrug is normal.     MRI shows severe stenosis at L4-5.  May very well be a disc herniation contributing to this, but ligamentous hypertrophy, bony encroachment, and facet hypertrophy all lead to severe stenosis.     I have recommended strongly that he undergo operative decompression secondary to the fact that this stenosis is not going to get better with injections, and no exercise is going to do anything.  He does not need an arthrodesis.  He does not have a listhesis at this level.  Alignment looks quite good.  We will get this scheduled as soon as possible.

## 2020-11-07 NOTE — Anesthesia Postprocedure Evaluation (Signed)
Anesthesia Post Note  Patient: Keith Oconnell  Procedure(s) Performed: Lumbar 4-5 Laminectomy/Foraminotomy (N/A Spine Lumbar)     Patient location during evaluation: PACU Anesthesia Type: General Level of consciousness: awake and alert, patient cooperative and oriented Pain management: pain level controlled Vital Signs Assessment: post-procedure vital signs reviewed and stable Respiratory status: spontaneous breathing, nonlabored ventilation and respiratory function stable Cardiovascular status: blood pressure returned to baseline and stable Postop Assessment: no apparent nausea or vomiting Anesthetic complications: no   No complications documented.  Last Vitals:  Vitals:   11/07/20 1720 11/07/20 1735  BP: (!) 141/67 (!) 151/63  Pulse: 60 60  Resp: 14 12  Temp:    SpO2: 98% 99%    Last Pain:  Vitals:   11/07/20 1735  TempSrc:   PainSc: 0-No pain    LLE Motor Response: Purposeful movement;Responds to commands (11/07/20 1735) LLE Sensation: Full sensation (11/07/20 1735) RLE Motor Response: Purposeful movement;Responds to commands (11/07/20 1735) RLE Sensation: Full sensation (11/07/20 1735)      Erling Cruz. Graziella Connery

## 2020-11-07 NOTE — Transfer of Care (Signed)
Immediate Anesthesia Transfer of Care Note  Patient: Keith Oconnell  Procedure(s) Performed: Lumbar 4-5 Laminectomy/Foraminotomy (N/A Spine Lumbar)  Patient Location: PACU  Anesthesia Type:General  Level of Consciousness: awake, alert  and oriented  Airway & Oxygen Therapy: Patient Spontanous Breathing and Patient connected to face mask oxygen  Post-op Assessment: Report given to RN and Post -op Vital signs reviewed and stable  Post vital signs: Reviewed and stable  Last Vitals:  Vitals Value Taken Time  BP 151/60 11/07/20 1706  Temp    Pulse 65 11/07/20 1707  Resp 15 11/07/20 1707  SpO2 99 % 11/07/20 1707  Vitals shown include unvalidated device data.  Last Pain:  Vitals:   11/07/20 1048  TempSrc:   PainSc: 7       Patients Stated Pain Goal: 3 (11/07/20 1048)  Complications: No complications documented.

## 2020-11-07 NOTE — Anesthesia Procedure Notes (Signed)
Procedure Name: Intubation Date/Time: 11/07/2020 1:37 PM Performed by: Candis Shine, CRNA Pre-anesthesia Checklist: Patient identified, Emergency Drugs available, Suction available and Patient being monitored Patient Re-evaluated:Patient Re-evaluated prior to induction Oxygen Delivery Method: Circle System Utilized Preoxygenation: Pre-oxygenation with 100% oxygen Induction Type: IV induction Ventilation: Mask ventilation without difficulty Laryngoscope Size: Mac and 4 Grade View: Grade I Tube type: Oral Tube size: 7.5 mm Number of attempts: 1 Airway Equipment and Method: Stylet Placement Confirmation: ETT inserted through vocal cords under direct vision,  positive ETCO2 and breath sounds checked- equal and bilateral Secured at: 23 cm Tube secured with: Tape Dental Injury: Teeth and Oropharynx as per pre-operative assessment

## 2020-11-07 NOTE — Anesthesia Preprocedure Evaluation (Addendum)
Anesthesia Evaluation  Patient identified by MRN, date of birth, ID band Patient awake    Reviewed: Allergy & Precautions, NPO status , Patient's Chart, lab work & pertinent test results  History of Anesthesia Complications Negative for: history of anesthetic complications  Airway Mallampati: I  TM Distance: >3 FB Neck ROM: Full    Dental  (+) Missing, Dental Advisory Given, Chipped   Pulmonary neg pulmonary ROS,  11/06/2020 SARS coronavirus NEG   breath sounds clear to auscultation       Cardiovascular hypertension, Pt. on medications (-) angina Rhythm:Regular Rate:Normal     Neuro/Psych Back pain    GI/Hepatic Neg liver ROS, GERD  Medicated and Controlled,  Endo/Other  diabetes (glu 113), Oral Hypoglycemic Agents  Renal/GU negative Renal ROS     Musculoskeletal  (+) Arthritis ,   Abdominal   Peds  Hematology negative hematology ROS (+)   Anesthesia Other Findings   Reproductive/Obstetrics                            Anesthesia Physical Anesthesia Plan  ASA: II  Anesthesia Plan: General   Post-op Pain Management:    Induction: Intravenous  PONV Risk Score and Plan: 2  Airway Management Planned: Oral ETT  Additional Equipment: None  Intra-op Plan:   Post-operative Plan: Extubation in OR  Informed Consent: I have reviewed the patients History and Physical, chart, labs and discussed the procedure including the risks, benefits and alternatives for the proposed anesthesia with the patient or authorized representative who has indicated his/her understanding and acceptance.     Dental advisory given  Plan Discussed with: CRNA and Surgeon  Anesthesia Plan Comments:        Anesthesia Quick Evaluation

## 2020-11-07 NOTE — Progress Notes (Signed)
Orthopedic Tech Progress Note Patient Details:  Keith Oconnell Aug 07, 1951 [id number] Fitted patient with a LSO BRACE while in PACU Patient ID: PRATEEK KNIPPLE, male   DOB: 1951/03/11, 69 y.o.   MRN: [id number]   Donald Pore 11/07/2020, 5:33 PM

## 2020-11-08 ENCOUNTER — Encounter (HOSPITAL_COMMUNITY): Payer: Self-pay | Admitting: Neurosurgery

## 2020-11-08 DIAGNOSIS — M48062 Spinal stenosis, lumbar region with neurogenic claudication: Secondary | ICD-10-CM | POA: Diagnosis not present

## 2020-11-08 DIAGNOSIS — Z7982 Long term (current) use of aspirin: Secondary | ICD-10-CM | POA: Diagnosis not present

## 2020-11-08 DIAGNOSIS — Z79899 Other long term (current) drug therapy: Secondary | ICD-10-CM | POA: Diagnosis not present

## 2020-11-08 DIAGNOSIS — E119 Type 2 diabetes mellitus without complications: Secondary | ICD-10-CM | POA: Diagnosis not present

## 2020-11-08 DIAGNOSIS — M532X6 Spinal instabilities, lumbar region: Secondary | ICD-10-CM | POA: Diagnosis not present

## 2020-11-08 DIAGNOSIS — I10 Essential (primary) hypertension: Secondary | ICD-10-CM | POA: Diagnosis not present

## 2020-11-08 DIAGNOSIS — M47816 Spondylosis without myelopathy or radiculopathy, lumbar region: Secondary | ICD-10-CM | POA: Diagnosis not present

## 2020-11-08 DIAGNOSIS — Z7984 Long term (current) use of oral hypoglycemic drugs: Secondary | ICD-10-CM | POA: Diagnosis not present

## 2020-11-08 LAB — GLUCOSE, CAPILLARY
Glucose-Capillary: 102 mg/dL — ABNORMAL HIGH (ref 70–99)
Glucose-Capillary: 128 mg/dL — ABNORMAL HIGH (ref 70–99)
Glucose-Capillary: 175 mg/dL — ABNORMAL HIGH (ref 70–99)

## 2020-11-08 MED ORDER — OXYCODONE HCL 5 MG PO TABS: 5.0000 mg | ORAL_TABLET | Freq: Four times a day (QID) | ORAL | 0 refills | Status: AC | PRN

## 2020-11-08 NOTE — Discharge Instructions (Signed)

## 2020-11-08 NOTE — Evaluation (Signed)
Physical Therapy Evaluation and Discharge Patient Details Name: Keith Oconnell MRN: [id number] DOB: 1951-03-23 Today's Date: 11/08/2020   History of Present Illness  69 y.o. male, Lumbar stenosis with neurogenic claudication. Procedure 12/8, Lumbar 4-5 PLIF.   Clinical Impression  Patient evaluated by Physical Therapy with no further acute PT needs identified. All education has been completed and the patient has no further questions. Pt was able to demonstrate transfers and ambulation with gross modified independence and no AD. Pt was educated on precautions, brace application/wearing schedule, appropriate activity progression, and car transfer. See below for any follow-up Physical Therapy or equipment needs. PT is signing off. Thank you for this referral.     Follow Up Recommendations No PT follow up;Supervision for mobility/OOB    Equipment Recommendations  None recommended by PT    Recommendations for Other Services       Precautions / Restrictions Precautions Precautions: Back Precaution Booklet Issued: Yes (comment) Required Braces or Orthoses: Spinal Brace Spinal Brace: Lumbar corset Restrictions Weight Bearing Restrictions: No      Mobility  Bed Mobility Overal bed mobility: Modified Independent             General bed mobility comments: Pt was able to transition from EOB to supine without assistance. HOB flat and rails lowered to simulate home environment    Transfers Overall transfer level: Modified independent Equipment used: None             General transfer comment: Pt demonstrated proper hand placement on seated surface for safety as he powered up to full stand.  Ambulation/Gait Ambulation/Gait assistance: Modified independent (Device/Increase time) Gait Distance (Feet): 300 Feet Assistive device: None Gait Pattern/deviations: Step-through pattern;Decreased stride length;Trunk flexed Gait velocity: Decreased Gait velocity interpretation: 1.31 -  2.62 ft/sec, indicative of limited community ambulator General Gait Details: VC's for improved posture and forward gaze. No unsteadiness or LOB noted.  Stairs Stairs: Yes Stairs assistance: Supervision Stair Management: One rail Right;Alternating pattern;Forwards Number of Stairs: 10 General stair comments: VC's for sequencing and general safety. No assist required.  Wheelchair Mobility    Modified Rankin (Stroke Patients Only)       Balance Overall balance assessment: Mild deficits observed, not formally tested                                           Pertinent Vitals/Pain Pain Assessment: Faces Faces Pain Scale: Hurts a little bit Pain Location: low back Pain Descriptors / Indicators: Aching Pain Intervention(s): Limited activity within patient's tolerance;Monitored during session;Repositioned    Home Living Family/patient expects to be discharged to:: Private residence Living Arrangements: Spouse/significant other Available Help at Discharge: Family Type of Home: House Home Access: Stairs to enter Entrance Stairs-Rails: Left Entrance Stairs-Number of Steps: 3 Home Layout: One level;Laundry or work area in basement;Able to live on main level with bedroom/bathroom Home Equipment: Adaptive equipment;Grab bars - tub/shower;Cane - single point      Prior Function Level of Independence: Independent               Hand Dominance   Dominant Hand: Left    Extremity/Trunk Assessment   Upper Extremity Assessment Upper Extremity Assessment: Defer to OT evaluation    Lower Extremity Assessment Lower Extremity Assessment: Generalized weakness    Cervical / Trunk Assessment Cervical / Trunk Assessment: Other exceptions Cervical / Trunk Exceptions: s/p surgery  Communication  Communication: No difficulties  Cognition Arousal/Alertness: Awake/alert Behavior During Therapy: WFL for tasks assessed/performed Overall Cognitive Status: Within  Functional Limits for tasks assessed                                        General Comments      Exercises     Assessment/Plan    PT Assessment Patent does not need any further PT services  PT Problem List         PT Treatment Interventions      PT Goals (Current goals can be found in the Care Plan section)  Acute Rehab PT Goals Patient Stated Goal: Pain free and return home PT Goal Formulation: All assessment and education complete, DC therapy    Frequency     Barriers to discharge        Co-evaluation               AM-PAC PT "6 Clicks" Mobility  Outcome Measure Help needed turning from your back to your side while in a flat bed without using bedrails?: None Help needed moving from lying on your back to sitting on the side of a flat bed without using bedrails?: None Help needed moving to and from a bed to a chair (including a wheelchair)?: None Help needed standing up from a chair using your arms (e.g., wheelchair or bedside chair)?: None Help needed to walk in hospital room?: None Help needed climbing 3-5 steps with a railing? : None 6 Click Score: 24    End of Session Equipment Utilized During Treatment: Gait belt;Back brace Activity Tolerance: Patient tolerated treatment well Patient left: in bed;with call bell/phone within reach Nurse Communication: Mobility status PT Visit Diagnosis: Pain;Difficulty in walking, not elsewhere classified (R26.2) Pain - part of body:  (back)    Time: 9924-2683 PT Time Calculation (min) (ACUTE ONLY): 13 min   Charges:   PT Evaluation $PT Eval Low Complexity: 1 Low          Conni Slipper, PT, DPT Acute Rehabilitation Services Pager: [phone] Office: [phone]   Marylynn Pearson 11/08/2020, 2:23 PM

## 2020-11-08 NOTE — Evaluation (Signed)
Occupational Therapy Evaluation Patient Details Name: Keith Oconnell MRN: [id number] DOB: 1951-04-01 Today's Date: 11/08/2020    History of Present Illness 69 y.o. male, Lumbar stenosis with neurogenic claudication. Procedure 12/8, Lumbar 4-5 Plif.  PMH includes: balance problem, heart murmur, hypercholesterolemia, swelling in the feet, leg pain with walking, joint pain, skin disease, leg pain, back pain, leg weakness, arm weakness, arthritis.   Clinical Impression   Patient admitted for the diagnosis and procedure above.  Deficits listed below.  He is essentially at or near his prior level of function for basic mobility and ADL.  Patient with teach back of back precautions, and good follow through.  No further OT needs in the acute setting.      Follow Up Recommendations  No OT follow up    Equipment Recommendations  None recommended by OT    Recommendations for Other Services       Precautions / Restrictions Precautions Precautions: Back Precaution Booklet Issued: Yes (comment) Required Braces or Orthoses: Spinal Brace Spinal Brace: Lumbar corset Restrictions Weight Bearing Restrictions: No      Mobility Bed Mobility Overal bed mobility: Modified Independent                  Transfers Overall transfer level: Independent                    Balance Overall balance assessment: Mild deficits observed, not formally tested                                         ADL either performed or assessed with clinical judgement   ADL Overall ADL's : Modified independent                                       General ADL Comments: Patient is up and mobilizing in the room without an AD.  Able to complete all ADL from modified sit/stand level with good follow through of all precautions.  Able to place brace.     Vision Patient Visual Report: No change from baseline       Perception     Praxis      Pertinent Vitals/Pain Pain  Assessment: Faces Faces Pain Scale: Hurts a little bit Pain Location: low back Pain Descriptors / Indicators: Aching Pain Intervention(s): Monitored during session     Hand Dominance Left   Extremity/Trunk Assessment Upper Extremity Assessment Upper Extremity Assessment: RUE deficits/detail;LUE deficits/detail RUE Deficits / Details: limited shoulder AROM RUE Sensation: WNL RUE Coordination: WNL LUE Deficits / Details: Limited shoulder AROM LUE Sensation: WNL LUE Coordination: WNL   Lower Extremity Assessment Lower Extremity Assessment: Defer to PT evaluation   Cervical / Trunk Assessment Cervical / Trunk Assessment: Normal   Communication Communication Communication: No difficulties   Cognition Arousal/Alertness: Awake/alert Behavior During Therapy: WFL for tasks assessed/performed Overall Cognitive Status: Within Functional Limits for tasks assessed                                     General Comments   No dizziness expressed.      Exercises     Shoulder Instructions      Home Living Family/patient expects to be discharged to:: Private residence  Living Arrangements: Spouse/significant other Available Help at Discharge: Family Type of Home: House Home Access: Stairs to enter [employer] of Steps: 3 Entrance Stairs-Rails: Left Home Layout: One level;Laundry or work area in basement;Able to live on main level with bedroom/bathroom     Bathroom Shower/Tub: Chief Strategy Officer: Standard     Home Equipment: Adaptive equipment;Grab bars - tub/shower;Cane - single point [employer]: Reacher        Prior Functioning/Environment Level of Independence: Independent                 OT Problem List: Pain      OT Treatment/Interventions:      OT Goals(Current goals can be found in the care plan section) Acute Rehab OT Goals Patient Stated Goal: Pain free and return home OT Goal Formulation: With  patient Time For Goal Achievement: 11/08/20 Potential to Achieve Goals: Good  OT Frequency:     Barriers to D/C:  none noted          Co-evaluation              AM-PAC OT "6 Clicks" Daily Activity     Outcome Measure Help from another person eating meals?: None Help from another person taking care of personal grooming?: None Help from another person toileting, which includes using toliet, bedpan, or urinal?: None Help from another person bathing (including washing, rinsing, drying)?: None Help from another person to put on and taking off regular upper body clothing?: None Help from another person to put on and taking off regular lower body clothing?: None 6 Click Score: 24   End of Session Equipment Utilized During Treatment: Back brace Nurse Communication: Other (comment) (No OT need)  Activity Tolerance: Patient tolerated treatment well Patient left: in chair;with call bell/phone within reach  OT Visit Diagnosis: Unsteadiness on feet (R26.81)                Time: 9798-9211 OT Time Calculation (min): 26 min Charges:  OT General Charges $OT Visit: 1 Visit OT Evaluation $OT Eval Moderate Complexity: 1 Mod OT Treatments $Self Care/Home Management : 8-22 mins  11/08/2020  [name], OTR/L  Acute Rehabilitation Services  Office:  [phone]   [name] 11/08/2020, 9:32 AM

## 2020-11-08 NOTE — Progress Notes (Signed)
Patient is discharged from room 3C05 at this time. Alert and in stable condition. IV site d/c'd and instructions read to patient and son with understanding verbalized. Left unit via wheelchair with all belongings at side.  

## 2020-11-08 NOTE — Discharge Summary (Signed)
Physician Discharge Summary  Patient ID: Keith Oconnell MRN: [id number] DOB/AGE: 1951/07/08 69 y.o.  Admit date: 11/07/2020 Discharge date: 11/08/2020  Admission Diagnoses:spinal stenosis L4/5 with neurogenic claudication  Discharge Diagnoses: same Active Problems:   Spinal stenosis, lumbar region with neurogenic claudication   Discharged Condition: good  Hospital Course: Mr. Simkin was admitted and taken to the operating room for a planned discetomy at L4/5, intraoperatively I found his facets to be quite unstable. Thus I performed a PLIF, and posterior instrumentation at L4/5. He is ambulating, voiding, and tolerating a regular diet at discharge. His wound is clean, dry, and without signs of infection  Treatments: surgery:  Lumbar stenosis with neurogenic claudication Facet arthropathy and embarrasment PROCEDURE:  Procedure(s): Lumbar 4-5 Plif, Nuvasive peek 52m cages packed with autograft Non segmental posterior instrumentation relign Nuvasive screws  Discharge Exam: Blood pressure (!) 148/61, pulse (!) 58, temperature 97.8 F (36.6 C), temperature source Oral, resp. rate 18, height 6' (1.829 m), weight 90.7 kg, SpO2 100 %. General appearance: alert, cooperative, appears stated age and no distress  Disposition: Discharge disposition: 01-Home or Self Care      Lumbar stenosis with neurogenic claudication  Allergies as of 11/08/2020      Reactions   Morphine And Related Nausea And Vomiting      Medication List    TAKE these medications   amLODipine 10 MG tablet Commonly known as: NORVASC Take 10 mg by mouth at bedtime.   aspirin EC 81 MG tablet Take 81 mg by mouth daily.   cholecalciferol 25 MCG (1000 UNIT) tablet Commonly known as: VITAMIN D Take 1,000 Units by mouth daily.   CO Q 10 PO Take 1 tablet by mouth daily.   fenofibrate 145 MG tablet Commonly known as: TRICOR Take 145 mg by mouth at bedtime.   gabapentin 100 MG capsule Commonly known as:  NEURONTIN Take 400 mg by mouth at bedtime.   Garlique 400 MG Tbec Generic drug: Garlic Take 4592mg by mouth daily.   halobetasol 0.05 % cream Commonly known as: ULTRAVATE Apply 1 application topically once a week.   HYDROcodone-acetaminophen 5-325 MG tablet Commonly known as: NORCO/VICODIN Take 1 tablet by mouth at bedtime.   losartan-hydrochlorothiazide 100-25 MG tablet Commonly known as: HYZAAR Take 1 tablet by mouth daily.   Magnesium 250 MG Tabs Take 250 mg by mouth daily.   metFORMIN 500 MG 24 hr tablet Commonly known as: GLUCOPHAGE-XR Take 500-1,000 mg by mouth See admin instructions. Take 500 mg in the morning and 1000 mg at bedtime   multivitamins ther. w/minerals Tabs tablet Take 1 tablet by mouth daily.   Omega 3 1200 MG Caps Take 1,200-2,400 mg by mouth See admin instructions. Take 1200 mg in the morning 2400 mg at bedtime   omeprazole 20 MG tablet Commonly known as: PRILOSEC OTC Take 20 mg by mouth daily as needed (for reflux).   ONE TOUCH ULTRA 2 w/Device Kit   ONE TOUCH ULTRA TEST test strip Generic drug: glucose blood   oxyCODONE 5 MG immediate release tablet Commonly known as: Oxy IR/ROXICODONE Take 1 tablet (5 mg total) by mouth every 6 (six) hours as needed for up to 8 days for moderate pain ((score 4 to 6)).   pravastatin 40 MG tablet Commonly known as: PRAVACHOL Take 40 mg by mouth at bedtime.   tiZANidine 4 MG tablet Commonly known as: ZANAFLEX Take 2 mg by mouth at bedtime.       Follow-up Information  Ashok Pall, MD Follow up in 3 week(s).   Specialty: Neurosurgery Why: please call the office to make an appointment Contact information: [address] [place] [address] [phone]               Signed: Ashok Pall 11/08/2020, 4:26 PM

## 2020-12-03 DIAGNOSIS — M48062 Spinal stenosis, lumbar region with neurogenic claudication: Secondary | ICD-10-CM | POA: Diagnosis not present

## 2020-12-12 DIAGNOSIS — G894 Chronic pain syndrome: Secondary | ICD-10-CM | POA: Diagnosis not present

## 2020-12-12 DIAGNOSIS — E1169 Type 2 diabetes mellitus with other specified complication: Secondary | ICD-10-CM | POA: Diagnosis not present

## 2020-12-12 DIAGNOSIS — Z0000 Encounter for general adult medical examination without abnormal findings: Secondary | ICD-10-CM | POA: Diagnosis not present

## 2020-12-12 DIAGNOSIS — M19012 Primary osteoarthritis, left shoulder: Secondary | ICD-10-CM | POA: Diagnosis not present

## 2020-12-12 DIAGNOSIS — E1159 Type 2 diabetes mellitus with other circulatory complications: Secondary | ICD-10-CM | POA: Diagnosis not present

## 2020-12-12 DIAGNOSIS — E785 Hyperlipidemia, unspecified: Secondary | ICD-10-CM | POA: Diagnosis not present

## 2020-12-12 DIAGNOSIS — I152 Hypertension secondary to endocrine disorders: Secondary | ICD-10-CM | POA: Diagnosis not present

## 2020-12-12 DIAGNOSIS — E1142 Type 2 diabetes mellitus with diabetic polyneuropathy: Secondary | ICD-10-CM | POA: Diagnosis not present

## 2020-12-12 DIAGNOSIS — M19011 Primary osteoarthritis, right shoulder: Secondary | ICD-10-CM | POA: Diagnosis not present

## 2020-12-27 DIAGNOSIS — R319 Hematuria, unspecified: Secondary | ICD-10-CM | POA: Diagnosis not present

## 2021-01-24 DIAGNOSIS — D509 Iron deficiency anemia, unspecified: Secondary | ICD-10-CM | POA: Diagnosis not present

## 2021-01-24 DIAGNOSIS — E1159 Type 2 diabetes mellitus with other circulatory complications: Secondary | ICD-10-CM | POA: Diagnosis not present

## 2021-01-24 DIAGNOSIS — I152 Hypertension secondary to endocrine disorders: Secondary | ICD-10-CM | POA: Diagnosis not present

## 2021-01-30 DIAGNOSIS — M961 Postlaminectomy syndrome, not elsewhere classified: Secondary | ICD-10-CM | POA: Diagnosis not present

## 2021-01-30 DIAGNOSIS — G894 Chronic pain syndrome: Secondary | ICD-10-CM | POA: Diagnosis not present

## 2021-01-30 DIAGNOSIS — E1159 Type 2 diabetes mellitus with other circulatory complications: Secondary | ICD-10-CM | POA: Diagnosis not present

## 2021-01-30 DIAGNOSIS — M19012 Primary osteoarthritis, left shoulder: Secondary | ICD-10-CM | POA: Diagnosis not present

## 2021-01-30 DIAGNOSIS — I152 Hypertension secondary to endocrine disorders: Secondary | ICD-10-CM | POA: Diagnosis not present

## 2021-01-30 DIAGNOSIS — M19011 Primary osteoarthritis, right shoulder: Secondary | ICD-10-CM | POA: Diagnosis not present

## 2021-03-13 DIAGNOSIS — E7800 Pure hypercholesterolemia, unspecified: Secondary | ICD-10-CM | POA: Diagnosis not present

## 2021-03-13 DIAGNOSIS — E1159 Type 2 diabetes mellitus with other circulatory complications: Secondary | ICD-10-CM | POA: Diagnosis not present

## 2021-03-13 DIAGNOSIS — E1142 Type 2 diabetes mellitus with diabetic polyneuropathy: Secondary | ICD-10-CM | POA: Diagnosis not present

## 2021-03-13 DIAGNOSIS — E1169 Type 2 diabetes mellitus with other specified complication: Secondary | ICD-10-CM | POA: Diagnosis not present

## 2021-03-13 DIAGNOSIS — D509 Iron deficiency anemia, unspecified: Secondary | ICD-10-CM | POA: Diagnosis not present

## 2021-03-13 DIAGNOSIS — I152 Hypertension secondary to endocrine disorders: Secondary | ICD-10-CM | POA: Diagnosis not present

## 2021-03-13 DIAGNOSIS — E785 Hyperlipidemia, unspecified: Secondary | ICD-10-CM | POA: Diagnosis not present

## 2021-04-16 DIAGNOSIS — M48062 Spinal stenosis, lumbar region with neurogenic claudication: Secondary | ICD-10-CM | POA: Diagnosis not present

## 2021-04-16 DIAGNOSIS — I10 Essential (primary) hypertension: Secondary | ICD-10-CM | POA: Diagnosis not present

## 2021-05-01 DIAGNOSIS — Z79891 Long term (current) use of opiate analgesic: Secondary | ICD-10-CM | POA: Diagnosis not present

## 2021-05-01 DIAGNOSIS — E1159 Type 2 diabetes mellitus with other circulatory complications: Secondary | ICD-10-CM | POA: Diagnosis not present

## 2021-05-01 DIAGNOSIS — M961 Postlaminectomy syndrome, not elsewhere classified: Secondary | ICD-10-CM | POA: Diagnosis not present

## 2021-05-01 DIAGNOSIS — E1142 Type 2 diabetes mellitus with diabetic polyneuropathy: Secondary | ICD-10-CM | POA: Diagnosis not present

## 2021-05-01 DIAGNOSIS — I152 Hypertension secondary to endocrine disorders: Secondary | ICD-10-CM | POA: Diagnosis not present

## 2021-05-01 DIAGNOSIS — G894 Chronic pain syndrome: Secondary | ICD-10-CM | POA: Diagnosis not present

## 2021-05-01 DIAGNOSIS — M19012 Primary osteoarthritis, left shoulder: Secondary | ICD-10-CM | POA: Diagnosis not present

## 2021-05-01 DIAGNOSIS — M19011 Primary osteoarthritis, right shoulder: Secondary | ICD-10-CM | POA: Diagnosis not present

## 2021-05-02 DIAGNOSIS — Z79891 Long term (current) use of opiate analgesic: Secondary | ICD-10-CM | POA: Diagnosis not present

## 2021-06-18 DIAGNOSIS — K219 Gastro-esophageal reflux disease without esophagitis: Secondary | ICD-10-CM | POA: Diagnosis not present

## 2021-06-18 DIAGNOSIS — E1159 Type 2 diabetes mellitus with other circulatory complications: Secondary | ICD-10-CM | POA: Diagnosis not present

## 2021-06-18 DIAGNOSIS — R200 Anesthesia of skin: Secondary | ICD-10-CM | POA: Diagnosis not present

## 2021-06-18 DIAGNOSIS — I152 Hypertension secondary to endocrine disorders: Secondary | ICD-10-CM | POA: Diagnosis not present

## 2021-06-18 DIAGNOSIS — M19011 Primary osteoarthritis, right shoulder: Secondary | ICD-10-CM | POA: Diagnosis not present

## 2021-06-18 DIAGNOSIS — E785 Hyperlipidemia, unspecified: Secondary | ICD-10-CM | POA: Diagnosis not present

## 2021-06-18 DIAGNOSIS — G629 Polyneuropathy, unspecified: Secondary | ICD-10-CM | POA: Diagnosis not present

## 2021-06-18 DIAGNOSIS — M19012 Primary osteoarthritis, left shoulder: Secondary | ICD-10-CM | POA: Diagnosis not present

## 2021-06-18 DIAGNOSIS — R682 Dry mouth, unspecified: Secondary | ICD-10-CM | POA: Diagnosis not present

## 2021-06-18 DIAGNOSIS — E1169 Type 2 diabetes mellitus with other specified complication: Secondary | ICD-10-CM | POA: Diagnosis not present

## 2021-07-08 IMAGING — RF DG LUMBAR SPINE 2-3V
1 series · 2 of 2 positions shown · non-contrast
Comparison: Intraoperative radiographs of the lumbar spine
performed earlier today 11/07/2020. Lumbar spine MRI 10/15/2020.

CLINICAL DATA: Surgery, elective. Additional history provided: PLIF
of L4-5. Provided fluoroscopy time 34 seconds (22.97 mGy).

EXAM:
LUMBAR SPINE - 2-3 VIEW; DG C-ARM 1-60 MIN

[Series 1: run · 2 of 2 slices shown]
[im 1/2]
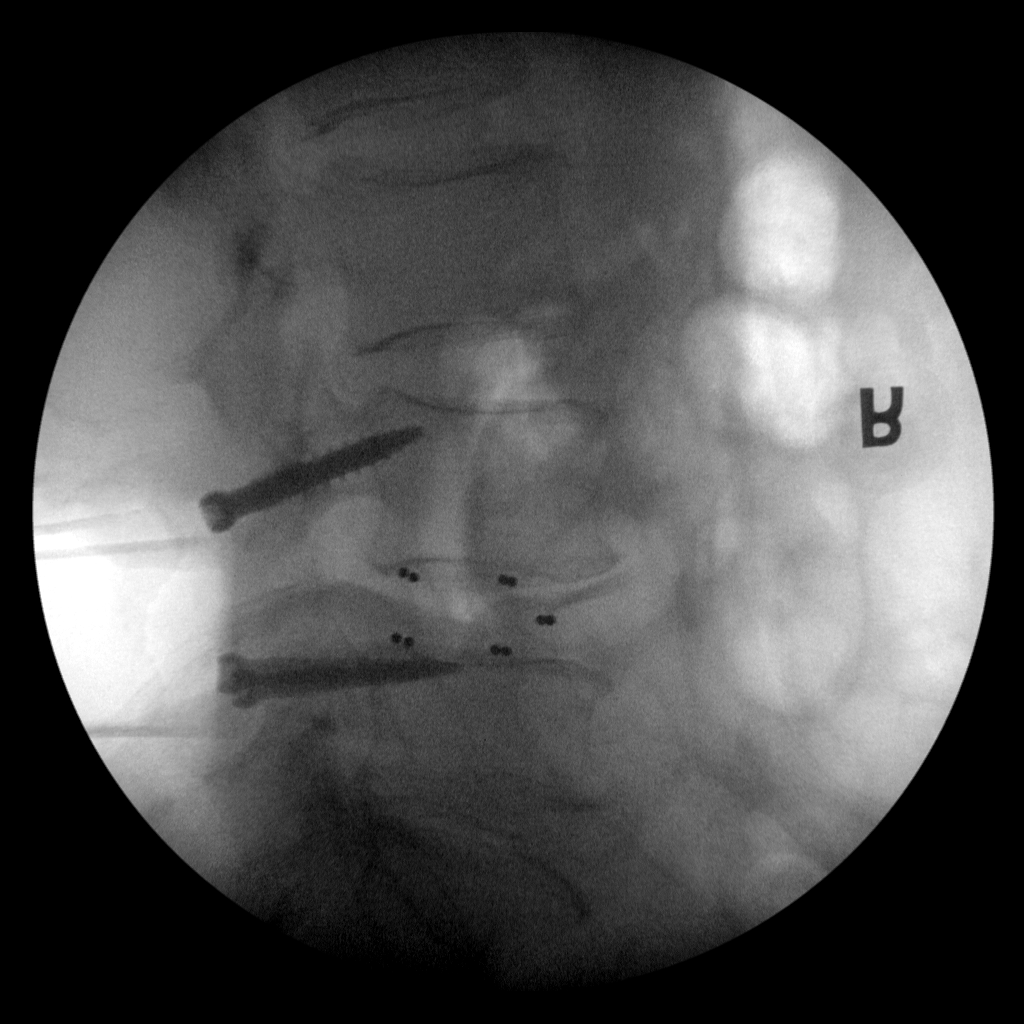
[im 2/2]
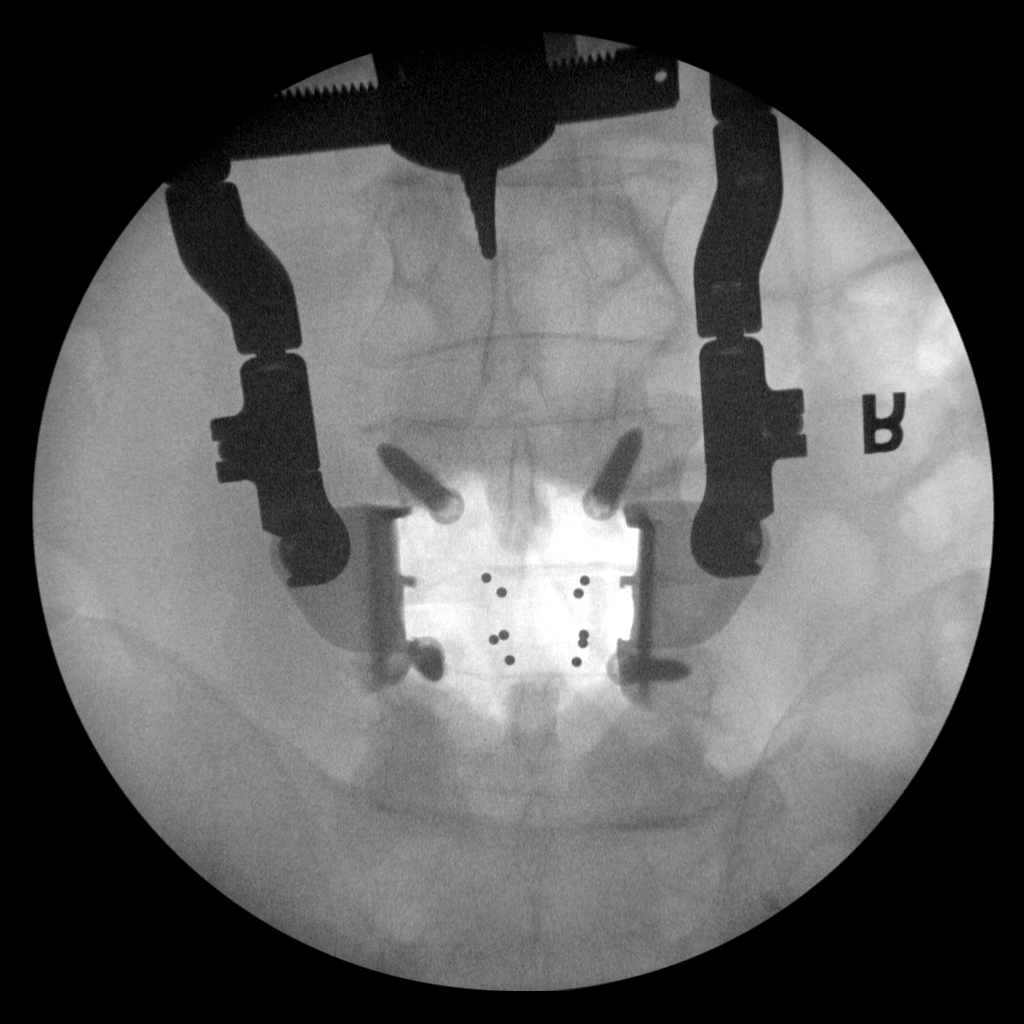

[2 of 2 positions shown; findings below may reference images not displayed]

FINDINGS: PA and lateral view intraoperative fluoroscopic images of the lumbar
spine are submitted, 2 images total. The images demonstrate
bilateral pedicle screws at L4 and L5. Vertical interconnecting rods
were not present at the time the images were taken. An interbody
device is also present at L4-L5. Overlying retractors.
IMPRESSION: Two intraoperative fluoroscopic images of the lumbar spine, as
described.

## 2021-07-08 IMAGING — CR DG LUMBAR SPINE 2-3V
2 series · 2 of 2 positions shown · non-contrast
Comparison: MRI 10/15/2020

CLINICAL DATA: Localization for laminectomy

EXAM:
LUMBAR SPINE - 2-3 VIEW

[xtable lateral (1 of 2)]
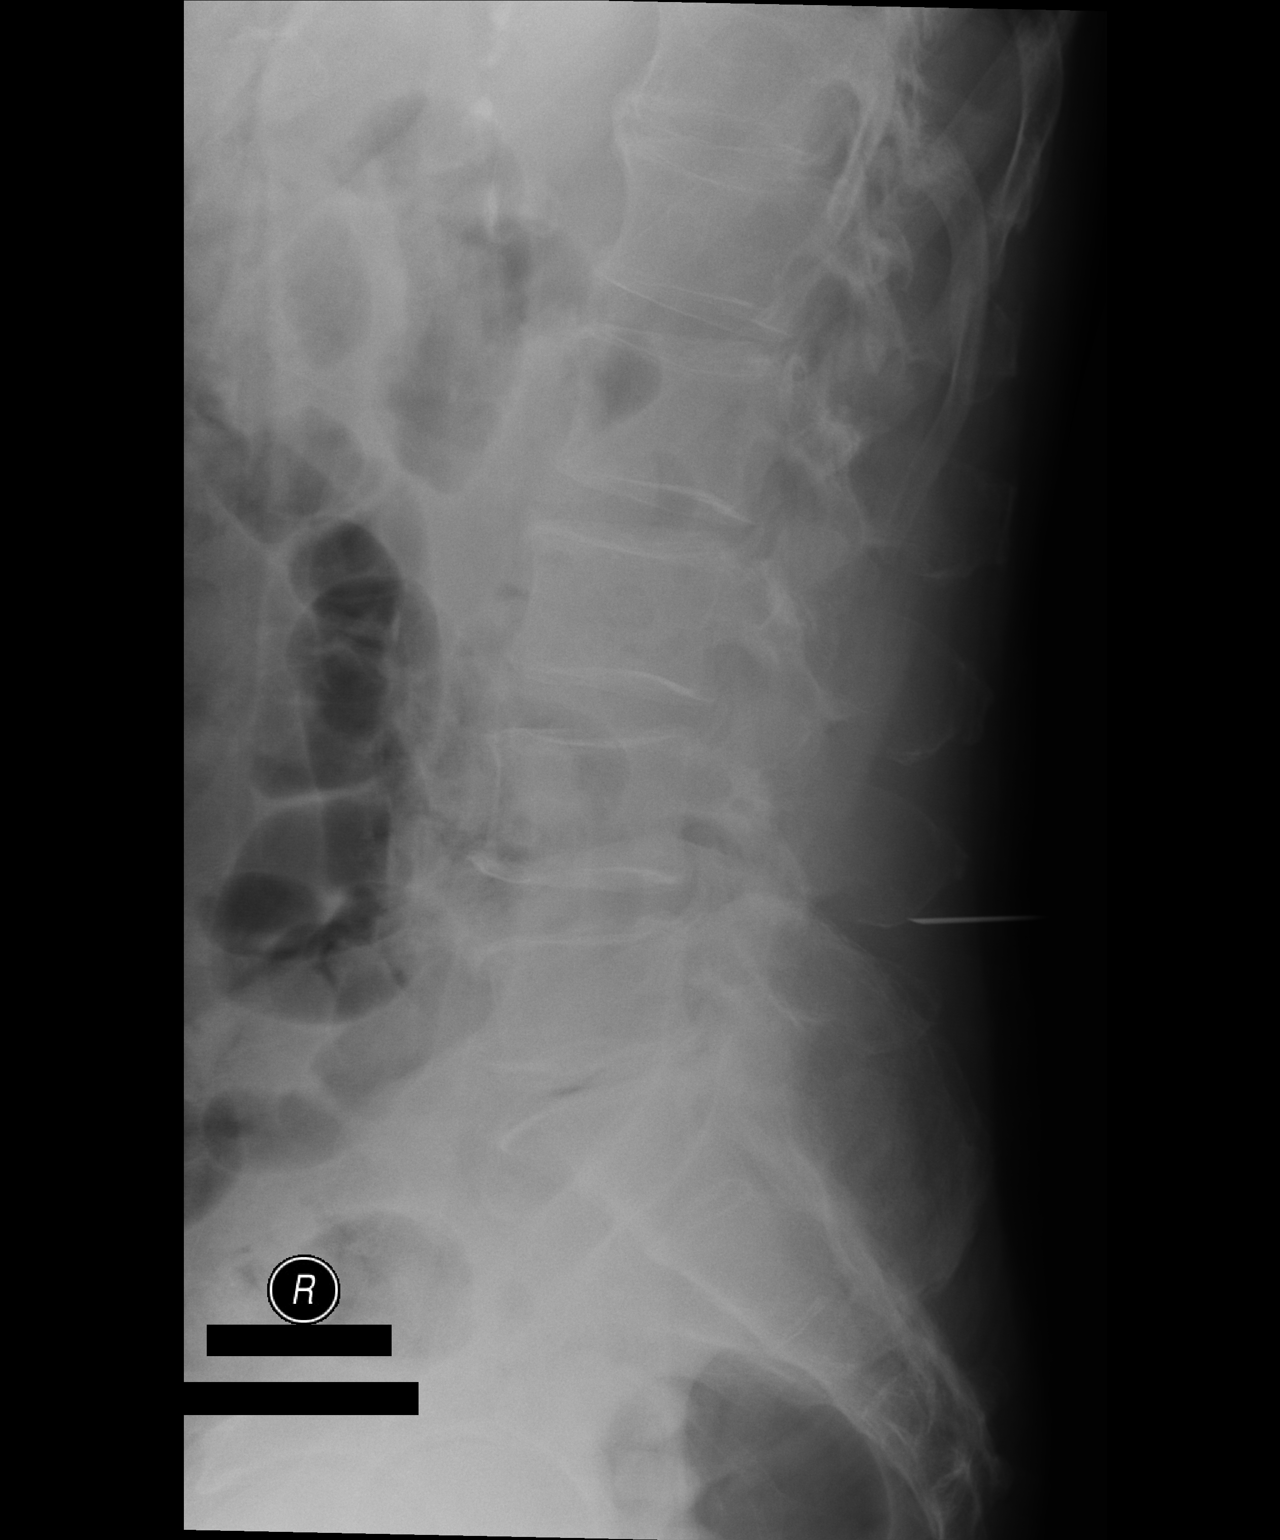

[xtable lateral (2 of 2)]
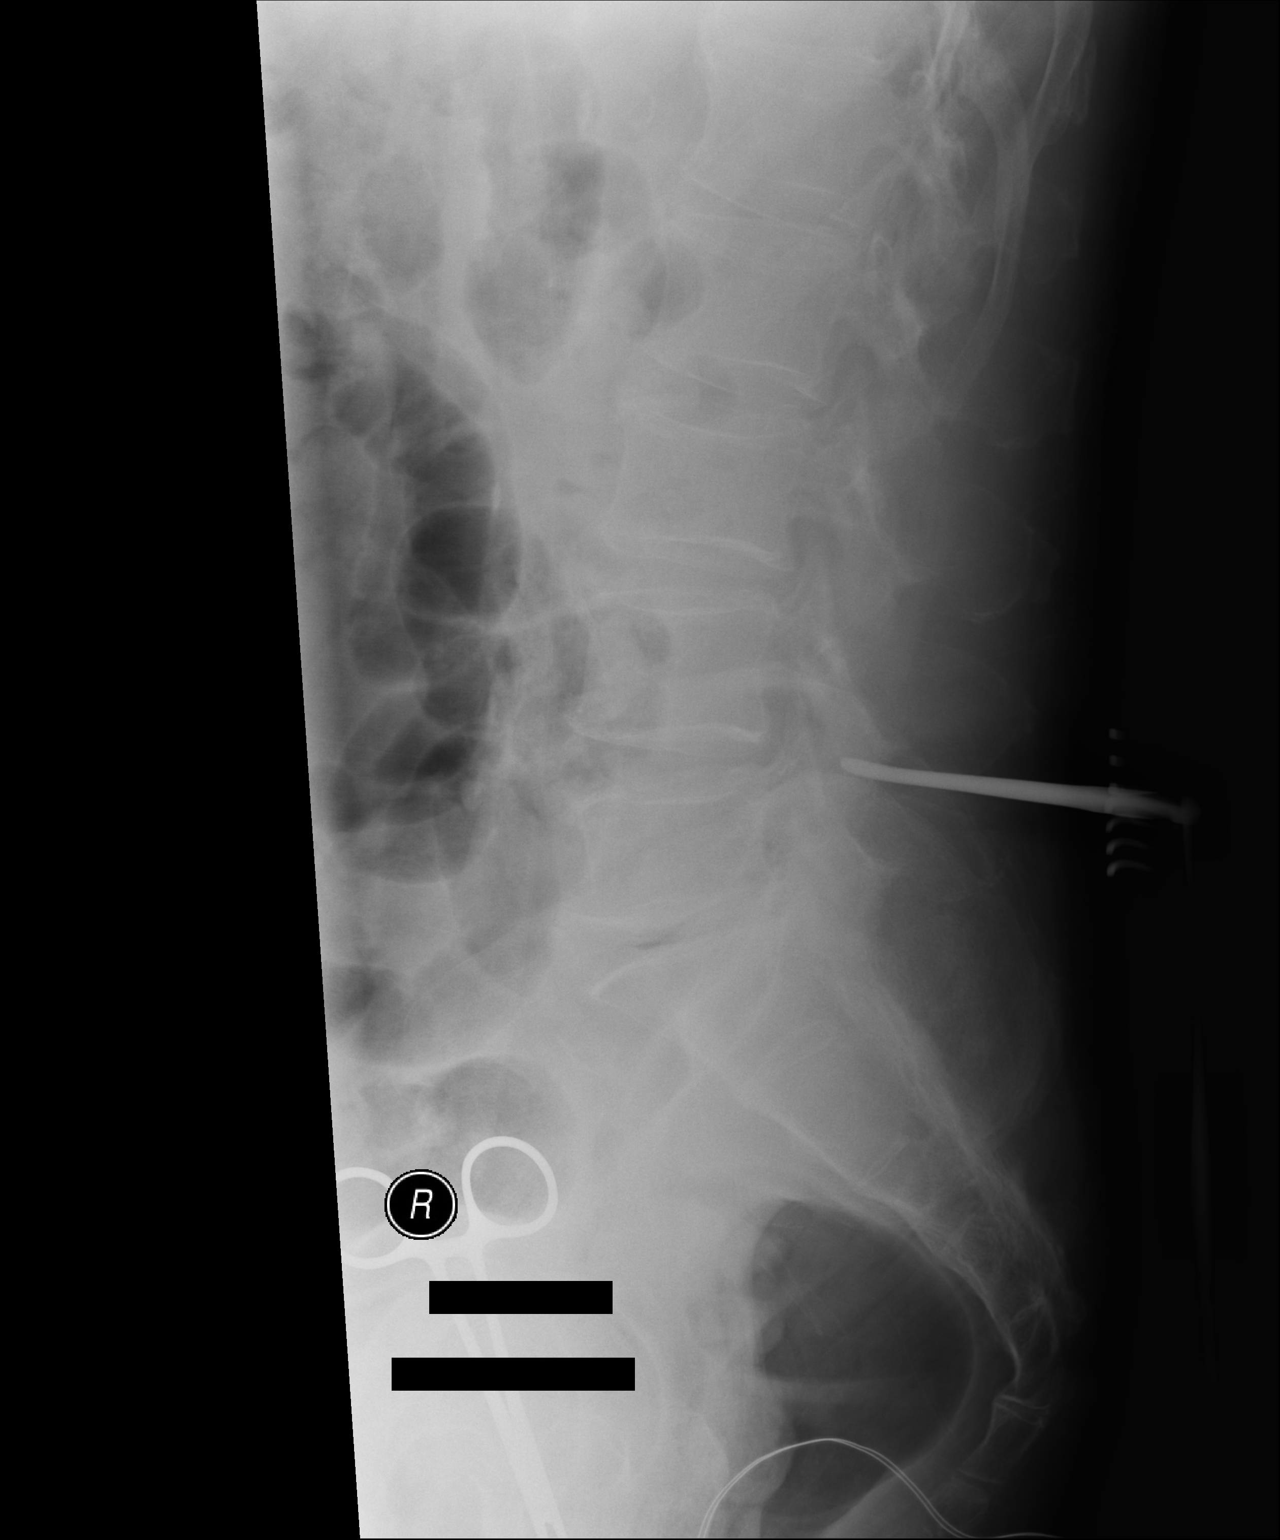

[2 of 2 positions shown; findings below may reference images not displayed]

FINDINGS: Two limited lateral views of the lumbar spine. Initial image
demonstrates linear localizing instrument overlying the inferior
aspect of the L4 spinous process. Subsequent image demonstrates
linear localizing device overlying the posterior elements at the
L4-L5 disc level.
IMPRESSION: Limited intraoperative lateral views of the lumbar spine for
localization purposes.

## 2023-06-15 ENCOUNTER — Emergency Department (HOSPITAL_COMMUNITY): Payer: Medicare Other

## 2023-06-15 ENCOUNTER — Encounter (HOSPITAL_COMMUNITY): Payer: Self-pay

## 2023-06-15 ENCOUNTER — Other Ambulatory Visit: Payer: Self-pay

## 2023-06-15 ENCOUNTER — Inpatient Hospital Stay (HOSPITAL_COMMUNITY)
Admission: EM | Admit: 2023-06-15 | Discharge: 2023-06-18 | DRG: 184 | Disposition: A | Discharge status: Home / Self Care | Payer: Medicare Other | Attending: Surgery | Admitting: Surgery

## 2023-06-15 DIAGNOSIS — E119 Type 2 diabetes mellitus without complications: Secondary | ICD-10-CM | POA: Diagnosis present

## 2023-06-15 DIAGNOSIS — S2242XA Multiple fractures of ribs, left side, initial encounter for closed fracture: Secondary | ICD-10-CM | POA: Diagnosis not present

## 2023-06-15 DIAGNOSIS — Z7984 Long term (current) use of oral hypoglycemic drugs: Secondary | ICD-10-CM

## 2023-06-15 DIAGNOSIS — Z79891 Long term (current) use of opiate analgesic: Secondary | ICD-10-CM

## 2023-06-15 DIAGNOSIS — S2249XA Multiple fractures of ribs, unspecified side, initial encounter for closed fracture: Secondary | ICD-10-CM | POA: Diagnosis not present

## 2023-06-15 DIAGNOSIS — M25551 Pain in right hip: Secondary | ICD-10-CM | POA: Diagnosis present

## 2023-06-15 DIAGNOSIS — Z7982 Long term (current) use of aspirin: Secondary | ICD-10-CM

## 2023-06-15 DIAGNOSIS — E785 Hyperlipidemia, unspecified: Secondary | ICD-10-CM | POA: Diagnosis present

## 2023-06-15 DIAGNOSIS — S80212A Abrasion, left knee, initial encounter: Secondary | ICD-10-CM | POA: Diagnosis present

## 2023-06-15 DIAGNOSIS — E860 Dehydration: Secondary | ICD-10-CM | POA: Diagnosis present

## 2023-06-15 DIAGNOSIS — Z79899 Other long term (current) drug therapy: Secondary | ICD-10-CM

## 2023-06-15 DIAGNOSIS — N135 Crossing vessel and stricture of ureter without hydronephrosis: Secondary | ICD-10-CM | POA: Diagnosis present

## 2023-06-15 DIAGNOSIS — Z885 Allergy status to narcotic agent status: Secondary | ICD-10-CM

## 2023-06-15 DIAGNOSIS — M1990 Unspecified osteoarthritis, unspecified site: Secondary | ICD-10-CM | POA: Diagnosis present

## 2023-06-15 DIAGNOSIS — S42342A Displaced spiral fracture of shaft of humerus, left arm, initial encounter for closed fracture: Secondary | ICD-10-CM | POA: Diagnosis present

## 2023-06-15 DIAGNOSIS — D62 Acute posthemorrhagic anemia: Secondary | ICD-10-CM | POA: Diagnosis not present

## 2023-06-15 DIAGNOSIS — M9732XA Periprosthetic fracture around internal prosthetic left shoulder joint, initial encounter: Secondary | ICD-10-CM | POA: Diagnosis present

## 2023-06-15 DIAGNOSIS — S80211A Abrasion, right knee, initial encounter: Secondary | ICD-10-CM | POA: Diagnosis present

## 2023-06-15 DIAGNOSIS — N202 Calculus of kidney with calculus of ureter: Secondary | ICD-10-CM | POA: Diagnosis present

## 2023-06-15 DIAGNOSIS — K219 Gastro-esophageal reflux disease without esophagitis: Secondary | ICD-10-CM | POA: Diagnosis present

## 2023-06-15 DIAGNOSIS — I10 Essential (primary) hypertension: Secondary | ICD-10-CM | POA: Diagnosis present

## 2023-06-15 DIAGNOSIS — Z96612 Presence of left artificial shoulder joint: Secondary | ICD-10-CM | POA: Diagnosis present

## 2023-06-15 DIAGNOSIS — T07XXXA Unspecified multiple injuries, initial encounter: Principal | ICD-10-CM

## 2023-06-15 DIAGNOSIS — Z23 Encounter for immunization: Secondary | ICD-10-CM

## 2023-06-15 LAB — COMPREHENSIVE METABOLIC PANEL
ALT: 25 U/L (ref 0–44)
AST: 36 U/L (ref 15–41)
Albumin: 3.4 g/dL — ABNORMAL LOW (ref 3.5–5.0)
Alkaline Phosphatase: 23 U/L — ABNORMAL LOW (ref 38–126)
Anion gap: 10 (ref 5–15)
BUN: 19 mg/dL (ref 8–23)
CO2: 21 mmol/L — ABNORMAL LOW (ref 22–32)
Calcium: 9.1 mg/dL (ref 8.9–10.3)
Chloride: 107 mmol/L (ref 98–111)
Creatinine, Ser: 1.37 mg/dL — ABNORMAL HIGH (ref 0.61–1.24)
GFR, Estimated: 55 mL/min — ABNORMAL LOW (ref >60)
Glucose, Bld: 175 mg/dL — ABNORMAL HIGH (ref 70–99)
Potassium: 3.3 mmol/L — ABNORMAL LOW (ref 3.5–5.1)
Sodium: 138 mmol/L (ref 135–145)
Total Bilirubin: 0.8 mg/dL (ref 0.3–1.2)
Total Protein: 5.7 g/dL — ABNORMAL LOW (ref 6.5–8.1)

## 2023-06-15 LAB — TROPONIN I (HIGH SENSITIVITY): Troponin I (High Sensitivity): 18 ng/L — ABNORMAL HIGH (ref <18)

## 2023-06-15 LAB — CBC WITH DIFFERENTIAL/PLATELET
Abs Immature Granulocytes: 0.07 10*3/uL (ref 0.00–0.07)
Basophils Absolute: 0.1 10*3/uL (ref 0.0–0.1)
Basophils Relative: 1 %
Eosinophils Absolute: 0.1 10*3/uL (ref 0.0–0.5)
Eosinophils Relative: 1 %
HCT: 31.9 % — ABNORMAL LOW (ref 39.0–52.0)
Hemoglobin: 10.5 g/dL — ABNORMAL LOW (ref 13.0–17.0)
Immature Granulocytes: 1 %
Lymphocytes Relative: 14 %
Lymphs Abs: 1.8 10*3/uL (ref 0.7–4.0)
MCH: 30.9 pg (ref 26.0–34.0)
MCHC: 32.9 g/dL (ref 30.0–36.0)
MCV: 93.8 fL (ref 80.0–100.0)
Monocytes Absolute: 0.9 10*3/uL (ref 0.1–1.0)
Monocytes Relative: 7 %
Neutro Abs: 10 10*3/uL — ABNORMAL HIGH (ref 1.7–7.7)
Neutrophils Relative %: 76 %
Platelets: 279 10*3/uL (ref 150–400)
RBC: 3.4 MIL/uL — ABNORMAL LOW (ref 4.22–5.81)
RDW: 14.6 % (ref 11.5–15.5)
WBC: 12.9 10*3/uL — ABNORMAL HIGH (ref 4.0–10.5)
nRBC: 0 % (ref 0.0–0.2)

## 2023-06-15 LAB — SAMPLE TO BLOOD BANK

## 2023-06-15 MED ORDER — HYDROMORPHONE HCL 1 MG/ML IJ SOLN
1.0000 mg | Freq: Once | INTRAMUSCULAR | Status: AC
  Administered 2023-06-15: 1 mg via INTRAVENOUS
  Filled 2023-06-15: qty 1

## 2023-06-15 MED ORDER — TETANUS-DIPHTH-ACELL PERTUSSIS 5-2.5-18.5 LF-MCG/0.5 IM SUSY
0.5000 mL | PREFILLED_SYRINGE | Freq: Once | INTRAMUSCULAR | Status: AC
  Administered 2023-06-15: 0.5 mL via INTRAMUSCULAR
  Filled 2023-06-15: qty 0.5

## 2023-06-15 MED ORDER — ONDANSETRON HCL 4 MG/2ML IJ SOLN
4.0000 mg | Freq: Once | INTRAMUSCULAR | Status: AC
  Administered 2023-06-15: 4 mg via INTRAVENOUS
  Filled 2023-06-15: qty 2
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
## 2023-06-15 NOTE — ED Notes (Signed)
Paramedic notified of HR
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
## 2023-06-16 ENCOUNTER — Inpatient Hospital Stay (HOSPITAL_COMMUNITY): Payer: Medicare Other

## 2023-06-16 DIAGNOSIS — S80212A Abrasion, left knee, initial encounter: Secondary | ICD-10-CM | POA: Diagnosis present

## 2023-06-16 DIAGNOSIS — S2249XA Multiple fractures of ribs, unspecified side, initial encounter for closed fracture: Secondary | ICD-10-CM | POA: Diagnosis present

## 2023-06-16 DIAGNOSIS — Z79899 Other long term (current) drug therapy: Secondary | ICD-10-CM | POA: Diagnosis not present

## 2023-06-16 DIAGNOSIS — N202 Calculus of kidney with calculus of ureter: Secondary | ICD-10-CM | POA: Diagnosis not present

## 2023-06-16 DIAGNOSIS — Z79891 Long term (current) use of opiate analgesic: Secondary | ICD-10-CM | POA: Diagnosis not present

## 2023-06-16 DIAGNOSIS — D62 Acute posthemorrhagic anemia: Secondary | ICD-10-CM | POA: Diagnosis not present

## 2023-06-16 DIAGNOSIS — N135 Crossing vessel and stricture of ureter without hydronephrosis: Secondary | ICD-10-CM | POA: Diagnosis not present

## 2023-06-16 DIAGNOSIS — Z96612 Presence of left artificial shoulder joint: Secondary | ICD-10-CM | POA: Diagnosis present

## 2023-06-16 DIAGNOSIS — S80211A Abrasion, right knee, initial encounter: Secondary | ICD-10-CM | POA: Diagnosis present

## 2023-06-16 DIAGNOSIS — Z885 Allergy status to narcotic agent status: Secondary | ICD-10-CM | POA: Diagnosis not present

## 2023-06-16 DIAGNOSIS — K219 Gastro-esophageal reflux disease without esophagitis: Secondary | ICD-10-CM | POA: Diagnosis present

## 2023-06-16 DIAGNOSIS — Z23 Encounter for immunization: Secondary | ICD-10-CM | POA: Diagnosis present

## 2023-06-16 DIAGNOSIS — S42342A Displaced spiral fracture of shaft of humerus, left arm, initial encounter for closed fracture: Secondary | ICD-10-CM | POA: Diagnosis not present

## 2023-06-16 DIAGNOSIS — M25551 Pain in right hip: Secondary | ICD-10-CM | POA: Diagnosis not present

## 2023-06-16 DIAGNOSIS — S2242XA Multiple fractures of ribs, left side, initial encounter for closed fracture: Secondary | ICD-10-CM | POA: Diagnosis not present

## 2023-06-16 DIAGNOSIS — E119 Type 2 diabetes mellitus without complications: Secondary | ICD-10-CM | POA: Diagnosis not present

## 2023-06-16 DIAGNOSIS — M1990 Unspecified osteoarthritis, unspecified site: Secondary | ICD-10-CM | POA: Diagnosis present

## 2023-06-16 DIAGNOSIS — M9732XA Periprosthetic fracture around internal prosthetic left shoulder joint, initial encounter: Secondary | ICD-10-CM | POA: Diagnosis not present

## 2023-06-16 DIAGNOSIS — E860 Dehydration: Secondary | ICD-10-CM | POA: Diagnosis not present

## 2023-06-16 DIAGNOSIS — Z7982 Long term (current) use of aspirin: Secondary | ICD-10-CM | POA: Diagnosis not present

## 2023-06-16 DIAGNOSIS — E785 Hyperlipidemia, unspecified: Secondary | ICD-10-CM | POA: Diagnosis not present

## 2023-06-16 DIAGNOSIS — Z7984 Long term (current) use of oral hypoglycemic drugs: Secondary | ICD-10-CM | POA: Diagnosis not present

## 2023-06-16 DIAGNOSIS — I10 Essential (primary) hypertension: Secondary | ICD-10-CM | POA: Diagnosis not present

## 2023-06-16 LAB — URINALYSIS, ROUTINE W REFLEX MICROSCOPIC
Bilirubin Urine: NEGATIVE
Glucose, UA: NEGATIVE mg/dL
Ketones, ur: NEGATIVE mg/dL
Nitrite: NEGATIVE
Protein, ur: 30 mg/dL — AB
Specific Gravity, Urine: 1.013 (ref 1.005–1.030)
pH: 5 (ref 5.0–8.0)

## 2023-06-16 LAB — TROPONIN I (HIGH SENSITIVITY): Troponin I (High Sensitivity): 62 ng/L — ABNORMAL HIGH (ref <18)

## 2023-06-16 MED ORDER — OMEPRAZOLE 20 MG PO CPDR: 20.0000 mg | DELAYED_RELEASE_CAPSULE | Freq: Every day | ORAL | Status: DC | PRN

## 2023-06-16 MED ORDER — ONDANSETRON 4 MG PO TBDP: 4.0000 mg | ORAL_TABLET | Freq: Four times a day (QID) | ORAL | Status: DC | PRN

## 2023-06-16 MED ORDER — MORPHINE SULFATE (PF) 4 MG/ML IV SOLN
4.0000 mg | INTRAVENOUS | Status: DC | PRN
  Administered 2023-06-16: 4 mg via INTRAVENOUS
  Filled 2023-06-16: qty 1

## 2023-06-16 MED ORDER — ACETAMINOPHEN 500 MG PO TABS
1000.0000 mg | ORAL_TABLET | Freq: Four times a day (QID) | ORAL | Status: DC
  Administered 2023-06-16 – 2023-06-18 (×8): 1000 mg via ORAL
  Filled 2023-06-16 (×9): qty 2

## 2023-06-16 MED ORDER — FENOFIBRATE 160 MG PO TABS
160.0000 mg | ORAL_TABLET | Freq: Every day | ORAL | Status: DC
  Administered 2023-06-16 – 2023-06-18 (×3): 160 mg via ORAL
  Filled 2023-06-16 (×3): qty 1

## 2023-06-16 MED ORDER — METHOCARBAMOL 500 MG PO TABS
500.0000 mg | ORAL_TABLET | Freq: Three times a day (TID) | ORAL | Status: DC
  Administered 2023-06-16 – 2023-06-18 (×8): 500 mg via ORAL
  Filled 2023-06-16 (×8): qty 1

## 2023-06-16 MED ORDER — DOCUSATE SODIUM 100 MG PO CAPS
100.0000 mg | ORAL_CAPSULE | Freq: Two times a day (BID) | ORAL | Status: DC
  Administered 2023-06-16 – 2023-06-18 (×5): 100 mg via ORAL
  Filled 2023-06-16 (×5): qty 1

## 2023-06-16 MED ORDER — AMLODIPINE BESYLATE 10 MG PO TABS
10.0000 mg | ORAL_TABLET | Freq: Every day | ORAL | Status: DC
  Administered 2023-06-16 – 2023-06-17 (×2): 10 mg via ORAL
  Filled 2023-06-16 (×2): qty 1

## 2023-06-16 MED ORDER — ONDANSETRON HCL 4 MG/2ML IJ SOLN: 4.0000 mg | Freq: Four times a day (QID) | INTRAMUSCULAR | Status: DC | PRN

## 2023-06-16 MED ORDER — IOHEXOL 350 MG/ML SOLN
75.0000 mL | Freq: Once | INTRAVENOUS | Status: AC | PRN
  Administered 2023-06-16: 75 mL via INTRAVENOUS

## 2023-06-16 MED ORDER — HYDROCHLOROTHIAZIDE 25 MG PO TABS
25.0000 mg | ORAL_TABLET | Freq: Every day | ORAL | Status: DC
  Administered 2023-06-16 – 2023-06-18 (×3): 25 mg via ORAL
  Filled 2023-06-16 (×3): qty 1

## 2023-06-16 MED ORDER — OXYCODONE HCL 5 MG PO TABS
5.0000 mg | ORAL_TABLET | ORAL | Status: DC | PRN
  Administered 2023-06-16 – 2023-06-18 (×9): 10 mg via ORAL
  Filled 2023-06-16 (×3): qty 2
  Filled 2023-06-16: qty 1
  Filled 2023-06-16 (×6): qty 2
  Filled 2023-06-16: qty 1
  Filled 2023-06-16: qty 2

## 2023-06-16 MED ORDER — LOSARTAN POTASSIUM-HCTZ 100-25 MG PO TABS: 1.0000 | ORAL_TABLET | Freq: Every day | ORAL | Status: DC

## 2023-06-16 MED ORDER — LOSARTAN POTASSIUM 50 MG PO TABS
100.0000 mg | ORAL_TABLET | Freq: Every day | ORAL | Status: DC
  Administered 2023-06-16 – 2023-06-18 (×3): 100 mg via ORAL
  Filled 2023-06-16 (×3): qty 2

## 2023-06-16 MED ORDER — METOPROLOL TARTRATE 5 MG/5ML IV SOLN: 5.0000 mg | Freq: Four times a day (QID) | INTRAVENOUS | Status: DC | PRN

## 2023-06-16 MED ORDER — HYDROMORPHONE HCL 1 MG/ML IJ SOLN
1.0000 mg | Freq: Once | INTRAMUSCULAR | Status: AC
  Administered 2023-06-16: 1 mg via INTRAVENOUS
  Filled 2023-06-16: qty 1

## 2023-06-16 MED ORDER — HYDRALAZINE HCL 20 MG/ML IJ SOLN: 10.0000 mg | INTRAMUSCULAR | Status: DC | PRN

## 2023-06-16 MED ORDER — POLYETHYLENE GLYCOL 3350 17 G PO PACK
17.0000 g | PACK | Freq: Every day | ORAL | Status: DC | PRN
  Administered 2023-06-17 – 2023-06-18 (×2): 17 g via ORAL
  Filled 2023-06-16 (×2): qty 1

## 2023-06-16 MED ORDER — METHOCARBAMOL 1000 MG/10ML IJ SOLN
500.0000 mg | Freq: Three times a day (TID) | INTRAVENOUS | Status: DC
  Filled 2023-06-16: qty 5

## 2023-06-16 MED ORDER — TIZANIDINE HCL 4 MG PO TABS
2.0000 mg | ORAL_TABLET | Freq: Every day | ORAL | Status: DC
  Administered 2023-06-16 – 2023-06-17 (×2): 2 mg via ORAL
  Filled 2023-06-16 (×2): qty 1

## 2023-06-16 MED ORDER — GABAPENTIN 400 MG PO CAPS
400.0000 mg | ORAL_CAPSULE | Freq: Every day | ORAL | Status: DC
  Administered 2023-06-16 – 2023-06-17 (×2): 400 mg via ORAL
  Filled 2023-06-16 (×2): qty 1

## 2023-06-16 NOTE — ED Notes (Signed)
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 no administration in time range)  ondansetron (ZOFRAN-ODT) disintegrating tablet 4 mg (has no administration in time range)    Or  ondansetron (ZOFRAN) injection 4 mg (has no administration in time range)  metoprolol tartrate (LOPRESSOR) injection 5 mg (has no administration in time range)  hydrALAZINE (APRESOLINE) injection 10 mg (has no administration in time range)  amLODipine (NORVASC) tablet 10 mg (has no administration in time range)  fenofibrate tablet 160 mg (160 mg Oral Given 06/16/23 1009)  gabapentin (NEURONTIN) capsule 400 mg (has no administration in time range)  omeprazole (PRILOSEC) capsule 20 mg (has no administration in time range)  tiZANidine (ZANAFLEX) tablet 2 mg (has no administration in time range)  losartan (COZAAR) tablet 100 mg (100 mg Oral Given 06/16/23 1010)  hydrochlorothiazide (HYDRODIURIL) tablet 25 mg (25 mg Oral Given 06/16/23 1009)  Tdap (BOOSTRIX) injection 0.5 mL (0.5 mLs Intramuscular  Given 06/15/23 2141)  HYDROmorphone (DILAUDID) injection 1 mg (1 mg Intravenous Given 06/15/23 2141)  ondansetron (ZOFRAN) injection 4 mg (4 mg Intravenous Given 06/15/23 2141)  HYDROmorphone (DILAUDID) injection 1 mg (1 mg Intravenous Given 06/16/23 0053)  iohexol (OMNIPAQUE) 350 MG/ML injection 75 mL (75 mLs Intravenous Contrast Given 06/16/23 0716)    Mobility non-ambulatory     Focused Assessments    R Recommendations: See Admitting Provider Note  Report given to:   Additional Notes:  pain management until OR

## 2023-06-16 NOTE — ED Notes (Signed)
Skin tear to R forearm cleaned with saline. Petroleum dressing applied.

## 2023-06-16 NOTE — ED Provider Notes (Signed)
12:42 AM Dr. Bedelia Person of [facility] to admit patient. Will be seen by Orthopedics later this AM for management of periprosthetic humeral fx. NPO since 0000.   Antony Madura, PA-C 06/16/23 2130    Zadie Rhine, MD 06/16/23 [phone]

## 2023-06-16 NOTE — Progress Notes (Signed)
Orthopedic Tech Progress Note Patient Details:  Keith Oconnell [birth year] [id number]  Ortho Devices Type of Ortho Device: Coapt Ortho Device/Splint Location: LUE Ortho Device/Splint Interventions: Ordered, Application, Adjustment   Post Interventions Patient Tolerated: Well Instructions Provided: Care of device, Adjustment of device  Sherilyn Banker 06/16/2023, 12:26 AM
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
## 2023-06-16 NOTE — ED Notes (Signed)
Report called to RN. ED RN and tech to transport pt upstairs via stretcher and monitor. Pt a&ox4,vss,nad.
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
## 2023-06-16 NOTE — TOC CAGE-AID Note (Signed)
Transition of Care [facility]) - CAGE-AID Screening   Patient Details  Name: Keith Oconnell MRN: [id number] Date of Birth: 12-Jul-1951  [facility] CM/SW Contact:    Katha Hamming, RN Phone Number: 06/16/2023, 12:12 AM    CAGE-AID Screening:    Have You Ever Felt You Ought to Cut Down on Your Drinking or Drug Use?: No Have People Annoyed You By [employer] Your Drinking Or Drug Use?: No Have You Felt Bad Or Guilty About Your Drinking Or Drug Use?: No Have You Ever Had a Drink or Used Drugs First Thing In The Morning to Steady Your Nerves or to Get Rid of a Hangover?: No CAGE-AID Score: 0  Substance Abuse Education Offered: No
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
## 2023-06-17 LAB — BASIC METABOLIC PANEL
Anion gap: 7 (ref 5–15)
BUN: 31 mg/dL — ABNORMAL HIGH (ref 8–23)
CO2: 24 mmol/L (ref 22–32)
Calcium: 8.7 mg/dL — ABNORMAL LOW (ref 8.9–10.3)
Chloride: 107 mmol/L (ref 98–111)
Creatinine, Ser: 1.92 mg/dL — ABNORMAL HIGH (ref 0.61–1.24)
GFR, Estimated: 37 mL/min — ABNORMAL LOW (ref >60)
Glucose, Bld: 134 mg/dL — ABNORMAL HIGH (ref 70–99)
Potassium: 3.4 mmol/L — ABNORMAL LOW (ref 3.5–5.1)
Sodium: 138 mmol/L (ref 135–145)

## 2023-06-17 LAB — URINALYSIS, W/ REFLEX TO CULTURE (INFECTION SUSPECTED)
Bilirubin Urine: NEGATIVE
Glucose, UA: NEGATIVE mg/dL
Hgb urine dipstick: NEGATIVE
Ketones, ur: NEGATIVE mg/dL
Nitrite: NEGATIVE
Protein, ur: NEGATIVE mg/dL
Specific Gravity, Urine: 1.034 — ABNORMAL HIGH (ref 1.005–1.030)
pH: 5 (ref 5.0–8.0)

## 2023-06-17 LAB — CBC
HCT: 26.1 % — ABNORMAL LOW (ref 39.0–52.0)
Hemoglobin: 8.5 g/dL — ABNORMAL LOW (ref 13.0–17.0)
MCH: 30.7 pg (ref 26.0–34.0)
MCHC: 32.6 g/dL (ref 30.0–36.0)
MCV: 94.2 fL (ref 80.0–100.0)
Platelets: 185 10*3/uL (ref 150–400)
RBC: 2.77 MIL/uL — ABNORMAL LOW (ref 4.22–5.81)
RDW: 15 % (ref 11.5–15.5)
WBC: 7 10*3/uL (ref 4.0–10.5)
nRBC: 0 % (ref 0.0–0.2)

## 2023-06-17 LAB — PROTIME-INR
INR: 1.2 (ref 0.8–1.2)
Prothrombin Time: 15.3 s — ABNORMAL HIGH (ref 11.4–15.2)

## 2023-06-17 MED ORDER — POTASSIUM CHLORIDE 20 MEQ PO PACK
40.0000 meq | PACK | ORAL | Status: AC
  Administered 2023-06-17 (×2): 40 meq via ORAL
  Filled 2023-06-17 (×2): qty 2

## 2023-06-17 MED ORDER — SODIUM CHLORIDE 0.9 % IV SOLN: INTRAVENOUS | Status: DC

## 2023-06-17 NOTE — Plan of Care (Signed)
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
## 2023-06-17 NOTE — Progress Notes (Addendum)
Orthopedics Progress Note  Subjective: Patient is comfortable in the Coapt splint  Objective:  Vitals:   06/17/23 1120 06/17/23 1552  BP: (!) 123/50 114/66  Pulse: (!) 58 (!) 53  Resp: 15 16  Temp: (!) 97.5 F (36.4 C) 97.6 F (36.4 C)  SpO2: 96% 96%    General: Awake and alert  Musculoskeletal: Left arm immobilized in the Coapt splint. Good alignment. Radial nerve intact Neurovascularly intact  Lab Results  Component Value Date   WBC 7.0 06/17/2023   HGB 8.5 (L) 06/17/2023   HCT 26.1 (L) 06/17/2023   MCV 94.2 06/17/2023   PLT 185 06/17/2023       Component Value Date/Time   NA 138 06/17/2023 0225   K 3.4 (L) 06/17/2023 0225   CL 107 06/17/2023 0225   CO2 24 06/17/2023 0225   GLUCOSE 134 (H) 06/17/2023 0225   BUN 31 (H) 06/17/2023 0225   CREATININE 1.92 (H) 06/17/2023 0225   CALCIUM 8.7 (L) 06/17/2023 0225   GFRNONAA 37 (L) 06/17/2023 0225   GFRAA 83 (L) 03/18/2014 0615    Lab Results  Component Value Date   INR 1.2 06/17/2023   INR 1.03 06/02/2013   INR 0.99 04/07/2013    Assessment/Plan: Left spiral midshaft peri-prosthetic humerus fracture, stable Based on the alignment of the fracture and the long oblique pattern, this is an excellent fracture to manage non operatively. Based on no other extremities involved this is our recommendation.  Maintain shoulder in the sling immobilizer and the plaster splint Pain control Follow up in two weeks with me in the office. Ranell Patrick, [facility])  No need for NPO for ortho   Almedia Balls. Ranell Patrick, MD 06/17/2023 5:37 PM
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
## 2023-06-18 LAB — BASIC METABOLIC PANEL
Anion gap: 5 (ref 5–15)
BUN: 35 mg/dL — ABNORMAL HIGH (ref 8–23)
CO2: 22 mmol/L (ref 22–32)
Calcium: 8.7 mg/dL — ABNORMAL LOW (ref 8.9–10.3)
Chloride: 109 mmol/L (ref 98–111)
Creatinine, Ser: 1.4 mg/dL — ABNORMAL HIGH (ref 0.61–1.24)
GFR, Estimated: 53 mL/min — ABNORMAL LOW (ref >60)
Glucose, Bld: 128 mg/dL — ABNORMAL HIGH (ref 70–99)
Potassium: 3.9 mmol/L (ref 3.5–5.1)
Sodium: 136 mmol/L (ref 135–145)

## 2023-06-18 LAB — URINE CULTURE: Culture: NO GROWTH

## 2023-06-18 LAB — CBC
HCT: 25.7 % — ABNORMAL LOW (ref 39.0–52.0)
Hemoglobin: 8.3 g/dL — ABNORMAL LOW (ref 13.0–17.0)
MCH: 30.5 pg (ref 26.0–34.0)
MCHC: 32.3 g/dL (ref 30.0–36.0)
MCV: 94.5 fL (ref 80.0–100.0)
Platelets: 159 10*3/uL (ref 150–400)
RBC: 2.72 MIL/uL — ABNORMAL LOW (ref 4.22–5.81)
RDW: 14.9 % (ref 11.5–15.5)
WBC: 5.7 10*3/uL (ref 4.0–10.5)
nRBC: 0 % (ref 0.0–0.2)

## 2023-06-18 MED ORDER — ACETAMINOPHEN 500 MG PO TABS: 1000.0000 mg | ORAL_TABLET | Freq: Four times a day (QID) | ORAL | Status: DC | PRN

## 2023-06-18 MED ORDER — OXYCODONE HCL 5 MG PO TABS: 5.0000 mg | ORAL_TABLET | ORAL | 0 refills | Status: DC | PRN

## 2023-06-18 MED ORDER — METHOCARBAMOL 500 MG PO TABS: 500.0000 mg | ORAL_TABLET | Freq: Three times a day (TID) | ORAL | 0 refills | Status: DC | PRN
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
## 2023-06-23 ENCOUNTER — Other Ambulatory Visit: Payer: Self-pay | Admitting: Urology

## 2023-06-24 ENCOUNTER — Encounter (HOSPITAL_BASED_OUTPATIENT_CLINIC_OR_DEPARTMENT_OTHER): Payer: Self-pay | Admitting: Urology
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
## 2023-06-29 ENCOUNTER — Encounter (HOSPITAL_BASED_OUTPATIENT_CLINIC_OR_DEPARTMENT_OTHER)
Admission: RE | Disposition: A | Discharge status: Home / Self Care | Payer: Self-pay | Source: Home / Self Care | Attending: Urology

## 2023-06-29 ENCOUNTER — Ambulatory Visit (HOSPITAL_COMMUNITY): Payer: Medicare Other

## 2023-06-29 ENCOUNTER — Encounter (HOSPITAL_BASED_OUTPATIENT_CLINIC_OR_DEPARTMENT_OTHER): Payer: Self-pay | Admitting: Urology

## 2023-06-29 ENCOUNTER — Ambulatory Visit (HOSPITAL_COMMUNITY)
Admission: RE | Admit: 2023-06-29 | Discharge: 2023-06-29 | Disposition: A | Discharge status: Home / Self Care | Payer: Medicare Other | Attending: Urology | Admitting: Urology

## 2023-06-29 DIAGNOSIS — I209 Angina pectoris, unspecified: Secondary | ICD-10-CM | POA: Diagnosis not present

## 2023-06-29 DIAGNOSIS — I10 Essential (primary) hypertension: Secondary | ICD-10-CM | POA: Insufficient documentation

## 2023-06-29 DIAGNOSIS — Z79899 Other long term (current) drug therapy: Secondary | ICD-10-CM | POA: Insufficient documentation

## 2023-06-29 DIAGNOSIS — Z7982 Long term (current) use of aspirin: Secondary | ICD-10-CM | POA: Insufficient documentation

## 2023-06-29 DIAGNOSIS — N201 Calculus of ureter: Secondary | ICD-10-CM | POA: Diagnosis present

## 2023-06-29 HISTORY — PX: EXTRACORPOREAL SHOCK WAVE LITHOTRIPSY: SHX1557

## 2023-06-29 LAB — GLUCOSE, CAPILLARY: Glucose-Capillary: 115 mg/dL — ABNORMAL HIGH (ref 70–99)

## 2023-06-29 SURGERY — LITHOTRIPSY, ESWL
Anesthesia: LOCAL | Laterality: Left

## 2023-06-29 MED ORDER — DIPHENHYDRAMINE HCL 25 MG PO CAPS
ORAL_CAPSULE | ORAL | Status: AC
  Filled 2023-06-29: qty 1

## 2023-06-29 MED ORDER — DIPHENHYDRAMINE HCL 25 MG PO CAPS
25.0000 mg | ORAL_CAPSULE | ORAL | Status: AC
  Administered 2023-06-29: 25 mg via ORAL

## 2023-06-29 MED ORDER — CIPROFLOXACIN HCL 500 MG PO TABS
ORAL_TABLET | ORAL | Status: AC
  Filled 2023-06-29: qty 1

## 2023-06-29 MED ORDER — TAMSULOSIN HCL 0.4 MG PO CAPS: 0.4000 mg | ORAL_CAPSULE | Freq: Every day | ORAL | 1 refills | Status: DC

## 2023-06-29 MED ORDER — SODIUM CHLORIDE 0.9 % IV SOLN: INTRAVENOUS | Status: DC

## 2023-06-29 MED ORDER — DIAZEPAM 5 MG PO TABS
10.0000 mg | ORAL_TABLET | ORAL | Status: AC
  Administered 2023-06-29: 10 mg via ORAL

## 2023-06-29 MED ORDER — CIPROFLOXACIN HCL 500 MG PO TABS
500.0000 mg | ORAL_TABLET | ORAL | Status: AC
  Administered 2023-06-29: 500 mg via ORAL

## 2023-06-29 MED ORDER — DIAZEPAM 5 MG PO TABS
ORAL_TABLET | ORAL | Status: AC
  Filled 2023-06-29: qty 2

## 2023-06-29 NOTE — Discharge Instructions (Signed)

## 2023-06-29 NOTE — H&P (Signed)
See scanned H&P

## 2023-06-29 NOTE — Interval H&P Note (Signed)
History and Physical Interval Note:  06/29/2023 7:58 AM  Keith Oconnell  has presented today for surgery, with the diagnosis of LEFT URETERAL STONE.  The various methods of treatment have been discussed with the patient and family. After consideration of risks, benefits and other options for treatment, the patient has consented to  Procedure(s): LEFT EXTRACORPOREAL SHOCK WAVE LITHOTRIPSY (ESWL) (Left) as a surgical intervention.  The patient's history has been reviewed, patient examined, no change in status, stable for surgery.  I have reviewed the patient's chart and labs.  Questions were answered to the patient's satisfaction.     Ray Church, III

## 2023-06-29 NOTE — Op Note (Signed)
See [facility] OP note scanned into chart. Also because of the size, density, location and other factors that cannot be anticipated I feel this will likely be a staged procedure. This fact supersedes any indication in the scanned [facility] operative note to the contrary.  

## 2023-06-30 ENCOUNTER — Encounter (HOSPITAL_BASED_OUTPATIENT_CLINIC_OR_DEPARTMENT_OTHER): Payer: Self-pay | Admitting: Urology
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
## 2023-07-08 NOTE — Progress Notes (Signed)
Spoke with Dr Ranell Patrick who was inquiring about Pt arrival for Sat 07/11/23 to [facility].  Instructions include arriving to [facility] by 0600.  Pt should check in in ER and then come straight back to PACU. Call [phone] upon arrival for assistance

## 2023-07-09 NOTE — Patient Instructions (Signed)
DUE TO COVID-19 ONLY TWO VISITORS  (aged 73 and older)  ARE ALLOWED TO COME WITH YOU AND STAY IN THE WAITING ROOM ONLY DURING PRE OP AND PROCEDURE.   **NO VISITORS ARE ALLOWED IN THE SHORT STAY AREA OR RECOVERY ROOM!!**  IF YOU WILL BE ADMITTED INTO THE HOSPITAL YOU ARE ALLOWED ONLY FOUR SUPPORT PEOPLE DURING VISITATION HOURS ONLY (7 AM -8PM)   The support person(s) must pass our screening, gel in and out, and wear a mask at all times, including in the patient's room. Patients must also wear a mask when staff or their support person are in the room. Visitors GUEST BADGE MUST BE WORN VISIBLY  One adult visitor may remain with you overnight and MUST be in the room by 8 P.M.     Your procedure is scheduled on: 07/11/23   Report to [facility] Emergency Room.    Report at : 5:45 AM   Call this number if you have problems the morning of surgery [phone]   Do not eat food or drink :After Midnight.  FOLLOW ANY ADDITIONAL PRE OP INSTRUCTIONS YOU RECEIVED FROM YOUR SURGEON'S OFFICE!!!   Oral Hygiene is also important to reduce your risk of infection.                                    Remember - BRUSH YOUR TEETH THE MORNING OF SURGERY WITH YOUR REGULAR TOOTHPASTE  DENTURES WILL BE REMOVED PRIOR TO SURGERY PLEASE DO NOT APPLY "Poly grip" OR ADHESIVES!!!   Do NOT smoke after Midnight   Take these medicines the morning of surgery with A SIP OF WATER: tamsulosin.Tylenol,omeprazole as needed.  DO NOT TAKE ANY ORAL DIABETIC MEDICATIONS DAY OF YOUR SURGERY                              You may not have any metal on your body including hair pins, jewelry, and body piercing             Do not wear lotions, powders, perfumes/cologne, or deodorant              Men may shave face and neck.   Do not bring valuables to the hospital. [facility] IS NOT             RESPONSIBLE   FOR VALUABLES.   Contacts, glasses, or bridgework may not be worn into surgery.   Bring small overnight bag  day of surgery.   DO NOT BRING YOUR HOME MEDICATIONS TO THE HOSPITAL. PHARMACY WILL DISPENSE MEDICATIONS LISTED ON YOUR MEDICATION LIST TO YOU DURING YOUR ADMISSION IN THE HOSPITAL!    Patients discharged on the day of surgery will not be allowed to drive home.  Someone NEEDS to stay with you for the first 24 hours after anesthesia.   Special Instructions: Bring a copy of your healthcare power of attorney and living will documents         the day of surgery if you haven't scanned them before.              Please read over the following fact sheets you were given: IF YOU HAVE QUESTIONS ABOUT YOUR PRE-OP INSTRUCTIONS PLEASE CALL [phone]    [facility] - Preparing for Surgery Before surgery, you can play an important role.  Because skin is not sterile, your skin needs  to be as free of germs as possible.  You can reduce the number of germs on your skin by washing with CHG (chlorahexidine gluconate) soap before surgery.  CHG is an antiseptic cleaner which kills germs and bonds with the skin to continue killing germs even after washing. Please DO NOT use if you have an allergy to CHG or antibacterial soaps.  If your skin becomes reddened/irritated stop using the CHG and inform your nurse when you arrive at Short Stay. Do not shave (including legs and underarms) for at least 48 hours prior to the first CHG shower.  You may shave your face/neck. Please follow these instructions carefully:  1.  Shower with CHG Soap the night before surgery and the  morning of Surgery.  2.  If you choose to wash your hair, wash your hair first as usual with your  normal  shampoo.  3.  After you shampoo, rinse your hair and body thoroughly to remove the  shampoo.                           4.  Use CHG as you would any other liquid soap.  You can apply chg directly  to the skin and wash                       Gently with a scrungie or clean washcloth.  5.  Apply the CHG Soap to your body ONLY FROM THE NECK DOWN.   Do not  use on face/ open                           Wound or open sores. Avoid contact with eyes, ears mouth and genitals (private parts).                       Wash face,  Genitals (private parts) with your normal soap.             6.  Wash thoroughly, paying special attention to the area where your surgery  will be performed.  7.  Thoroughly rinse your body with warm water from the neck down.  8.  DO NOT shower/wash with your normal soap after using and rinsing off  the CHG Soap.                9.  Pat yourself dry with a clean towel.            10.  Wear clean pajamas.            11.  Place clean sheets on your bed the night of your first shower and do not  sleep with pets. Day of Surgery : Do not apply any lotions/deodorants the morning of surgery.  Please wear clean clothes to the hospital/surgery center.  FAILURE TO FOLLOW THESE INSTRUCTIONS MAY RESULT IN THE CANCELLATION OF YOUR SURGERY PATIENT SIGNATURE_________________________________  NURSE SIGNATURE__________________________________  ________________________________________________________________________   Keith Oconnell  An incentive spirometer is a tool that can help keep your lungs clear and active. This tool measures how well you are filling your lungs with each breath. Taking long deep breaths may help reverse or decrease the chance of developing breathing (pulmonary) problems (especially infection) following: A long period of time when you are unable to move or be active. BEFORE THE PROCEDURE  If the spirometer includes an indicator to show your best effort,  your nurse or respiratory therapist will set it to a desired goal. If possible, sit up straight or lean slightly forward. Try not to slouch. Hold the incentive spirometer in an upright position. INSTRUCTIONS FOR USE  Sit on the edge of your bed if possible, or sit up as far as you can in bed or on a chair. Hold the incentive spirometer in an upright  position. Breathe out normally. Place the mouthpiece in your mouth and seal your lips tightly around it. Breathe in slowly and as deeply as possible, raising the piston or the ball toward the top of the column. Hold your breath for 3-5 seconds or for as long as possible. Allow the piston or ball to fall to the bottom of the column. Remove the mouthpiece from your mouth and breathe out normally. Rest for a few seconds and repeat Steps 1 through 7 at least 10 times every 1-2 hours when you are awake. Take your time and take a few normal breaths between deep breaths. The spirometer may include an indicator to show your best effort. Use the indicator as a goal to work toward during each repetition. After each set of 10 deep breaths, practice coughing to be sure your lungs are clear. If you have an incision (the cut made at the time of surgery), support your incision when coughing by placing a pillow or rolled up towels firmly against it. Once you are able to get out of bed, walk around indoors and cough well. You may stop using the incentive spirometer when instructed by your caregiver.  RISKS AND COMPLICATIONS Take your time so you do not get dizzy or light-headed. If you are in pain, you may need to take or ask for pain medication before doing incentive spirometry. It is harder to take a deep breath if you are having pain. AFTER USE Rest and breathe slowly and easily. It can be helpful to keep track of a log of your progress. Your caregiver can provide you with a simple table to help with this. If you are using the spirometer at home, follow these instructions: SEEK MEDICAL CARE IF:  You are having difficultly using the spirometer. You have trouble using the spirometer as often as instructed. Your pain medication is not giving enough relief while using the spirometer. You develop fever of 100.5 F (38.1 C) or higher. SEEK IMMEDIATE MEDICAL CARE IF:  You cough up bloody sputum that had not been  present before. You develop fever of 102 F (38.9 C) or greater. You develop worsening pain at or near the incision site. MAKE SURE YOU:  Understand these instructions. Will watch your condition. Will get help right away if you are not doing well or get worse. Document Released: 03/30/2007 Document Revised: 02/09/2012 Document Reviewed: 05/31/2007 [facility] Patient Information 2014 [place], [place].   ________________________________________________________________________

## 2023-07-10 ENCOUNTER — Encounter (HOSPITAL_COMMUNITY): Payer: Self-pay

## 2023-07-10 ENCOUNTER — Encounter (HOSPITAL_COMMUNITY)
Admission: RE | Admit: 2023-07-10 | Discharge: 2023-07-10 | Disposition: A | Discharge status: Home / Self Care | Payer: Medicare Other | Source: Ambulatory Visit | Attending: Orthopedic Surgery | Admitting: Orthopedic Surgery

## 2023-07-10 ENCOUNTER — Other Ambulatory Visit: Payer: Self-pay

## 2023-07-10 DIAGNOSIS — Z01818 Encounter for other preprocedural examination: Secondary | ICD-10-CM

## 2023-07-10 HISTORY — DX: Personal history of urinary calculi: Z87.442

## 2023-07-10 NOTE — Anesthesia Preprocedure Evaluation (Addendum)
Anesthesia Evaluation  Patient identified by MRN, date of birth, ID band Patient awake    Reviewed: Allergy & Precautions, NPO status , Patient's Chart, lab work & pertinent test results  Airway Mallampati: III  TM Distance: >3 FB Neck ROM: Full    Dental  (+) Teeth Intact, Dental Advisory Given   Pulmonary    breath sounds clear to auscultation       Cardiovascular hypertension, Pt. on medications  Rhythm:Regular Rate:Normal     Neuro/Psych negative neurological ROS  negative psych ROS   GI/Hepatic Neg liver ROS,GERD  Medicated,,  Endo/Other  diabetes, Type 2    Renal/GU negative Renal ROS     Musculoskeletal  (+) Arthritis ,    Abdominal   Peds  Hematology negative hematology ROS (+)   Anesthesia Other Findings   Reproductive/Obstetrics                             Anesthesia Physical Anesthesia Plan  ASA: 2 and emergent  Anesthesia Plan: General   Post-op Pain Management:    Induction: Intravenous  PONV Risk Score and Plan: 3 and Ondansetron, Dexamethasone and Treatment may vary due to age or medical condition  Airway Management Planned: Oral ETT  Additional Equipment: None  Intra-op Plan:   Post-operative Plan: Extubation in OR  Informed Consent: I have reviewed the patients History and Physical, chart, labs and discussed the procedure including the risks, benefits and alternatives for the proposed anesthesia with the patient or authorized representative who has indicated his/her understanding and acceptance.     Dental advisory given  Plan Discussed with: CRNA  Anesthesia Plan Comments: (See PAT note from 8/9 by K Gekas PA-C  No PNB due to possible nerve involvement.  )        Anesthesia Quick Evaluation

## 2023-07-10 NOTE — Progress Notes (Addendum)
For Short Stay: COVID SWAB appointment date:  Bowel Prep reminder:   For Anesthesia: PCP - Dr. Casimiro Needle badger Cardiologist - N/A  Chest x-ray - 06/16/23 EKG - 06/18/23 Stress Test -  ECHO - 10/07/18 Cardiac Cath -  Pacemaker/ICD device last checked: Pacemaker orders received: Device Rep notified:  Spinal Cord Stimulator: N/A  Sleep Study - N/A CPAP -   Fasting Blood Sugar - 99 - 120 Checks Blood Sugar __1___ times a day Date and result of last Hgb A1c- 6.0: 12/29/22  Last dose of GLP1 agonist- N/A GLP1 instructions:   Last dose of SGLT-2 inhibitors- N/A SGLT-2 instructions:   Blood Thinner Instructions: N/A Aspirin Instructions: Last Dose:  Activity level: Can go up a flight of stairs and activities of daily living without stopping and without chest pain and/or shortness of breath   Able to exercise without chest pain and/or shortness of breath  Anesthesia review: Hx: DIA,HTN,Heart murmur  Patient denies shortness of breath, fever, cough and chest pain at PAT appointment   Patient verbalized understanding of instructions that were given to them at the PAT appointment. Patient was also instructed that they will need to review over the PAT instructions again at home before surgery.
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
## 2023-07-11 ENCOUNTER — Ambulatory Visit (HOSPITAL_BASED_OUTPATIENT_CLINIC_OR_DEPARTMENT_OTHER): Payer: Medicare Other | Admitting: Certified Registered Nurse Anesthetist

## 2023-07-11 ENCOUNTER — Encounter (HOSPITAL_COMMUNITY): Payer: Self-pay | Admitting: Orthopedic Surgery

## 2023-07-11 ENCOUNTER — Ambulatory Visit (HOSPITAL_COMMUNITY): Payer: Self-pay | Admitting: Certified Registered Nurse Anesthetist

## 2023-07-11 ENCOUNTER — Observation Stay (HOSPITAL_COMMUNITY): Payer: Medicare Other

## 2023-07-11 ENCOUNTER — Other Ambulatory Visit: Payer: Self-pay

## 2023-07-11 ENCOUNTER — Encounter (HOSPITAL_COMMUNITY)
Admission: RE | Disposition: A | Discharge status: Home Health | Payer: Self-pay | Source: Home / Self Care | Attending: Orthopedic Surgery

## 2023-07-11 ENCOUNTER — Ambulatory Visit (HOSPITAL_COMMUNITY): Payer: Medicare Other

## 2023-07-11 ENCOUNTER — Observation Stay (HOSPITAL_COMMUNITY)
Admission: RE | Admit: 2023-07-11 | Discharge: 2023-07-13 | Disposition: A | Discharge status: Home Health | Payer: Medicare Other | Attending: Orthopedic Surgery | Admitting: Orthopedic Surgery

## 2023-07-11 DIAGNOSIS — Z96612 Presence of left artificial shoulder joint: Secondary | ICD-10-CM | POA: Diagnosis not present

## 2023-07-11 DIAGNOSIS — Y828 Other medical devices associated with adverse incidents: Secondary | ICD-10-CM | POA: Insufficient documentation

## 2023-07-11 DIAGNOSIS — I10 Essential (primary) hypertension: Secondary | ICD-10-CM | POA: Insufficient documentation

## 2023-07-11 DIAGNOSIS — Z79899 Other long term (current) drug therapy: Secondary | ICD-10-CM | POA: Diagnosis not present

## 2023-07-11 DIAGNOSIS — M9732XA Periprosthetic fracture around internal prosthetic left shoulder joint, initial encounter: Secondary | ICD-10-CM | POA: Diagnosis not present

## 2023-07-11 DIAGNOSIS — Z01818 Encounter for other preprocedural examination: Secondary | ICD-10-CM

## 2023-07-11 DIAGNOSIS — S42309A Unspecified fracture of shaft of humerus, unspecified arm, initial encounter for closed fracture: Principal | ICD-10-CM | POA: Diagnosis present

## 2023-07-11 DIAGNOSIS — E119 Type 2 diabetes mellitus without complications: Secondary | ICD-10-CM | POA: Diagnosis not present

## 2023-07-11 DIAGNOSIS — T8484XA Pain due to internal orthopedic prosthetic devices, implants and grafts, initial encounter: Secondary | ICD-10-CM | POA: Diagnosis present

## 2023-07-11 HISTORY — PX: ORIF HUMERUS FRACTURE: SHX2126

## 2023-07-11 LAB — CBC
HCT: 31 % — ABNORMAL LOW (ref 39.0–52.0)
Hemoglobin: 9.8 g/dL — ABNORMAL LOW (ref 13.0–17.0)
MCH: 30.4 pg (ref 26.0–34.0)
MCHC: 31.6 g/dL (ref 30.0–36.0)
MCV: 96.3 fL (ref 80.0–100.0)
Platelets: 276 10*3/uL (ref 150–400)
RBC: 3.22 MIL/uL — ABNORMAL LOW (ref 4.22–5.81)
RDW: 14.4 % (ref 11.5–15.5)
WBC: 7.1 10*3/uL (ref 4.0–10.5)
nRBC: 0 % (ref 0.0–0.2)

## 2023-07-11 LAB — TYPE AND SCREEN
ABO/RH(D): A POS
Antibody Screen: NEGATIVE

## 2023-07-11 LAB — GLUCOSE, CAPILLARY
Glucose-Capillary: 138 mg/dL — ABNORMAL HIGH (ref 70–99)
Glucose-Capillary: 163 mg/dL — ABNORMAL HIGH (ref 70–99)
Glucose-Capillary: 232 mg/dL — ABNORMAL HIGH (ref 70–99)
Glucose-Capillary: 181 mg/dL — ABNORMAL HIGH (ref 70–99)

## 2023-07-11 SURGERY — OPEN REDUCTION INTERNAL FIXATION (ORIF) HUMERAL SHAFT FRACTURE
Anesthesia: General | Site: Shoulder | Laterality: Left

## 2023-07-11 MED ORDER — ONDANSETRON HCL 4 MG/2ML IJ SOLN
INTRAMUSCULAR | Status: AC
  Filled 2023-07-11: qty 2

## 2023-07-11 MED ORDER — 0.9 % SODIUM CHLORIDE (POUR BTL) OPTIME
TOPICAL | Status: DC | PRN
  Administered 2023-07-11: 1000 mL

## 2023-07-11 MED ORDER — DOCUSATE SODIUM 100 MG PO CAPS
100.0000 mg | ORAL_CAPSULE | Freq: Two times a day (BID) | ORAL | Status: DC
  Administered 2023-07-11 – 2023-07-13 (×4): 100 mg via ORAL
  Filled 2023-07-11 (×4): qty 1

## 2023-07-11 MED ORDER — FENTANYL CITRATE (PF) 250 MCG/5ML IJ SOLN
INTRAMUSCULAR | Status: AC
  Filled 2023-07-11: qty 5

## 2023-07-11 MED ORDER — LIDOCAINE 2% (20 MG/ML) 5 ML SYRINGE
INTRAMUSCULAR | Status: DC | PRN
  Administered 2023-07-11: 50 mg via INTRAVENOUS

## 2023-07-11 MED ORDER — ROCURONIUM BROMIDE 10 MG/ML (PF) SYRINGE
PREFILLED_SYRINGE | INTRAVENOUS | Status: DC | PRN
  Administered 2023-07-11: 60 mg via INTRAVENOUS

## 2023-07-11 MED ORDER — LACTATED RINGERS IV SOLN: INTRAVENOUS | Status: DC | PRN

## 2023-07-11 MED ORDER — HYDROMORPHONE HCL 1 MG/ML IJ SOLN
INTRAMUSCULAR | Status: AC
  Filled 2023-07-11: qty 2

## 2023-07-11 MED ORDER — HYDROCHLOROTHIAZIDE 25 MG PO TABS
25.0000 mg | ORAL_TABLET | Freq: Every day | ORAL | Status: DC
  Administered 2023-07-12 – 2023-07-13 (×2): 25 mg via ORAL
  Filled 2023-07-11 (×2): qty 1

## 2023-07-11 MED ORDER — LACTATED RINGERS IV SOLN: INTRAVENOUS | Status: DC

## 2023-07-11 MED ORDER — FENOFIBRATE 160 MG PO TABS
160.0000 mg | ORAL_TABLET | Freq: Every day | ORAL | Status: DC
  Administered 2023-07-12 – 2023-07-13 (×2): 160 mg via ORAL
  Filled 2023-07-11 (×2): qty 1

## 2023-07-11 MED ORDER — METOCLOPRAMIDE HCL 5 MG PO TABS: 5.0000 mg | ORAL_TABLET | Freq: Three times a day (TID) | ORAL | Status: DC | PRN

## 2023-07-11 MED ORDER — MAGNESIUM 250 MG PO TABS: 250.0000 mg | ORAL_TABLET | Freq: Every day | ORAL | Status: DC

## 2023-07-11 MED ORDER — GARLIC 400 MG PO TBEC: 400.0000 mg | DELAYED_RELEASE_TABLET | Freq: Every day | ORAL | Status: DC

## 2023-07-11 MED ORDER — PRAVASTATIN SODIUM 20 MG PO TABS
40.0000 mg | ORAL_TABLET | Freq: Every day | ORAL | Status: DC
  Administered 2023-07-11 – 2023-07-12 (×2): 40 mg via ORAL
  Filled 2023-07-11 (×2): qty 2

## 2023-07-11 MED ORDER — ONDANSETRON HCL 4 MG PO TABS: 4.0000 mg | ORAL_TABLET | Freq: Four times a day (QID) | ORAL | Status: DC | PRN

## 2023-07-11 MED ORDER — ACETAMINOPHEN 10 MG/ML IV SOLN
1000.0000 mg | Freq: Once | INTRAVENOUS | Status: DC | PRN
  Administered 2023-07-11: 1000 mg via INTRAVENOUS

## 2023-07-11 MED ORDER — HYDROCODONE-ACETAMINOPHEN 5-325 MG PO TABS
1.0000 | ORAL_TABLET | ORAL | Status: DC | PRN
  Administered 2023-07-11: 1 via ORAL
  Administered 2023-07-11 – 2023-07-13 (×8): 2 via ORAL
  Filled 2023-07-11 (×8): qty 2
  Filled 2023-07-11: qty 1

## 2023-07-11 MED ORDER — AMISULPRIDE (ANTIEMETIC) 5 MG/2ML IV SOLN: 10.0000 mg | Freq: Once | INTRAVENOUS | Status: DC | PRN

## 2023-07-11 MED ORDER — ONDANSETRON HCL 4 MG/2ML IJ SOLN
INTRAMUSCULAR | Status: DC | PRN
  Administered 2023-07-11: 4 mg via INTRAVENOUS

## 2023-07-11 MED ORDER — ACETAMINOPHEN 160 MG/5ML PO SOLN: 325.0000 mg | Freq: Once | ORAL | Status: DC | PRN

## 2023-07-11 MED ORDER — SODIUM CHLORIDE 0.9 % IV SOLN: INTRAVENOUS | Status: DC

## 2023-07-11 MED ORDER — LIDOCAINE HCL (PF) 2 % IJ SOLN
INTRAMUSCULAR | Status: AC
  Filled 2023-07-11: qty 5

## 2023-07-11 MED ORDER — GABAPENTIN 400 MG PO CAPS
400.0000 mg | ORAL_CAPSULE | Freq: Every day | ORAL | Status: DC
  Administered 2023-07-11 – 2023-07-12 (×2): 400 mg via ORAL
  Filled 2023-07-11 (×2): qty 1

## 2023-07-11 MED ORDER — MAGNESIUM OXIDE -MG SUPPLEMENT 400 (240 MG) MG PO TABS
200.0000 mg | ORAL_TABLET | Freq: Every day | ORAL | Status: DC
  Administered 2023-07-12 – 2023-07-13 (×2): 200 mg via ORAL
  Filled 2023-07-11 (×2): qty 1

## 2023-07-11 MED ORDER — TRANEXAMIC ACID-NACL 1000-0.7 MG/100ML-% IV SOLN
1000.0000 mg | INTRAVENOUS | Status: AC
  Administered 2023-07-11: 1000 mg via INTRAVENOUS

## 2023-07-11 MED ORDER — ONDANSETRON HCL 4 MG/2ML IJ SOLN
4.0000 mg | Freq: Four times a day (QID) | INTRAMUSCULAR | Status: DC | PRN
  Administered 2023-07-11: 4 mg via INTRAVENOUS
  Filled 2023-07-11: qty 2

## 2023-07-11 MED ORDER — CEFAZOLIN SODIUM-DEXTROSE 2-4 GM/100ML-% IV SOLN
2.0000 g | Freq: Four times a day (QID) | INTRAVENOUS | Status: AC
  Administered 2023-07-11 – 2023-07-12 (×3): 2 g via INTRAVENOUS
  Filled 2023-07-11 (×3): qty 100

## 2023-07-11 MED ORDER — METHOCARBAMOL 500 MG PO TABS: 500.0000 mg | ORAL_TABLET | Freq: Three times a day (TID) | ORAL | 1 refills | Status: DC | PRN

## 2023-07-11 MED ORDER — HALOBETASOL PROPIONATE 0.05 % EX CREA: 1.0000 "application " | TOPICAL_CREAM | CUTANEOUS | Status: DC

## 2023-07-11 MED ORDER — TAMSULOSIN HCL 0.4 MG PO CAPS
0.4000 mg | ORAL_CAPSULE | Freq: Every day | ORAL | Status: DC
  Administered 2023-07-12 – 2023-07-13 (×2): 0.4 mg via ORAL
  Filled 2023-07-11 (×2): qty 1

## 2023-07-11 MED ORDER — PHENYLEPHRINE HCL-NACL 20-0.9 MG/250ML-% IV SOLN
INTRAVENOUS | Status: DC | PRN
  Administered 2023-07-11: 40 ug/min via INTRAVENOUS

## 2023-07-11 MED ORDER — BISACODYL 10 MG RE SUPP: 10.0000 mg | Freq: Every day | RECTAL | Status: DC | PRN

## 2023-07-11 MED ORDER — VITAMIN D 25 MCG (1000 UNIT) PO TABS
1000.0000 [IU] | ORAL_TABLET | Freq: Every day | ORAL | Status: DC
  Administered 2023-07-12 – 2023-07-13 (×2): 1000 [IU] via ORAL
  Filled 2023-07-11 (×2): qty 1

## 2023-07-11 MED ORDER — LOSARTAN POTASSIUM-HCTZ 100-25 MG PO TABS: 1.0000 | ORAL_TABLET | Freq: Every day | ORAL | Status: DC

## 2023-07-11 MED ORDER — PHENOL 1.4 % MT LIQD: 1.0000 | OROMUCOSAL | Status: DC | PRN

## 2023-07-11 MED ORDER — BUPIVACAINE-EPINEPHRINE 0.25% -1:200000 IJ SOLN
INTRAMUSCULAR | Status: AC
  Filled 2023-07-11: qty 1

## 2023-07-11 MED ORDER — PROPOFOL 10 MG/ML IV BOLUS
INTRAVENOUS | Status: AC
  Filled 2023-07-11: qty 20

## 2023-07-11 MED ORDER — OMEGA 3 1200 MG PO CAPS: 1200.0000 mg | ORAL_CAPSULE | ORAL | Status: DC

## 2023-07-11 MED ORDER — ACETAMINOPHEN 325 MG PO TABS: 325.0000 mg | ORAL_TABLET | Freq: Once | ORAL | Status: DC | PRN

## 2023-07-11 MED ORDER — OMEGA-3-ACID ETHYL ESTERS 1 G PO CAPS
1.0000 g | ORAL_CAPSULE | Freq: Two times a day (BID) | ORAL | Status: DC
  Administered 2023-07-11 – 2023-07-13 (×4): 1 g via ORAL
  Filled 2023-07-11 (×4): qty 1

## 2023-07-11 MED ORDER — HYDROMORPHONE HCL 1 MG/ML IJ SOLN
0.2500 mg | INTRAMUSCULAR | Status: DC | PRN
  Administered 2023-07-11 (×4): 0.5 mg via INTRAVENOUS

## 2023-07-11 MED ORDER — DEXAMETHASONE SODIUM PHOSPHATE 10 MG/ML IJ SOLN
INTRAMUSCULAR | Status: AC
  Filled 2023-07-11: qty 1

## 2023-07-11 MED ORDER — INSULIN ASPART 100 UNIT/ML IJ SOLN
4.0000 [IU] | Freq: Three times a day (TID) | INTRAMUSCULAR | Status: DC
  Administered 2023-07-11 – 2023-07-13 (×4): 4 [IU] via SUBCUTANEOUS

## 2023-07-11 MED ORDER — STERILE WATER FOR IRRIGATION IR SOLN
Status: DC | PRN
  Administered 2023-07-11: 2000 mL

## 2023-07-11 MED ORDER — TRANEXAMIC ACID-NACL 1000-0.7 MG/100ML-% IV SOLN
INTRAVENOUS | Status: AC
  Filled 2023-07-11: qty 100

## 2023-07-11 MED ORDER — BUPIVACAINE-EPINEPHRINE 0.25% -1:200000 IJ SOLN
INTRAMUSCULAR | Status: DC | PRN
  Administered 2023-07-11: 20 mL

## 2023-07-11 MED ORDER — HYDROCODONE-ACETAMINOPHEN 5-325 MG PO TABS: 1.0000 | ORAL_TABLET | ORAL | 0 refills | Status: AC | PRN

## 2023-07-11 MED ORDER — PROPOFOL 10 MG/ML IV BOLUS
INTRAVENOUS | Status: DC | PRN
Start: 2023-07-11 — End: 2023-07-11
  Administered 2023-07-11: 120 mg via INTRAVENOUS

## 2023-07-11 MED ORDER — POLYETHYLENE GLYCOL 3350 17 G PO PACK: 17.0000 g | PACK | Freq: Every day | ORAL | Status: DC | PRN

## 2023-07-11 MED ORDER — DEXAMETHASONE SODIUM PHOSPHATE 10 MG/ML IJ SOLN
INTRAMUSCULAR | Status: DC | PRN
  Administered 2023-07-11: 4 mg via INTRAVENOUS

## 2023-07-11 MED ORDER — MEPERIDINE HCL 50 MG/ML IJ SOLN: 6.2500 mg | INTRAMUSCULAR | Status: DC | PRN

## 2023-07-11 MED ORDER — MORPHINE SULFATE (PF) 2 MG/ML IV SOLN
0.5000 mg | INTRAVENOUS | Status: DC | PRN
  Administered 2023-07-11: 0.5 mg via INTRAVENOUS
  Administered 2023-07-11: 1 mg via INTRAVENOUS
  Administered 2023-07-11: 0.5 mg via INTRAVENOUS
  Filled 2023-07-11 (×3): qty 1

## 2023-07-11 MED ORDER — TRANEXAMIC ACID-NACL 1000-0.7 MG/100ML-% IV SOLN
1000.0000 mg | Freq: Once | INTRAVENOUS | Status: AC
  Administered 2023-07-11: 1000 mg via INTRAVENOUS
  Filled 2023-07-11: qty 100

## 2023-07-11 MED ORDER — ACETAMINOPHEN 325 MG PO TABS: 325.0000 mg | ORAL_TABLET | Freq: Four times a day (QID) | ORAL | Status: DC | PRN

## 2023-07-11 MED ORDER — MENTHOL 3 MG MT LOZG: 1.0000 | LOZENGE | OROMUCOSAL | Status: DC | PRN

## 2023-07-11 MED ORDER — INSULIN ASPART 100 UNIT/ML IJ SOLN
0.0000 [IU] | Freq: Three times a day (TID) | INTRAMUSCULAR | Status: DC
  Administered 2023-07-11: 5 [IU] via SUBCUTANEOUS
  Administered 2023-07-12: 2 [IU] via SUBCUTANEOUS
  Administered 2023-07-12: 3 [IU] via SUBCUTANEOUS

## 2023-07-11 MED ORDER — SUGAMMADEX SODIUM 200 MG/2ML IV SOLN
INTRAVENOUS | Status: DC | PRN
  Administered 2023-07-11: 200 mg via INTRAVENOUS

## 2023-07-11 MED ORDER — METOCLOPRAMIDE HCL 5 MG/ML IJ SOLN: 5.0000 mg | Freq: Three times a day (TID) | INTRAMUSCULAR | Status: DC | PRN

## 2023-07-11 MED ORDER — CEFAZOLIN SODIUM-DEXTROSE 2-4 GM/100ML-% IV SOLN
INTRAVENOUS | Status: AC
  Filled 2023-07-11: qty 100

## 2023-07-11 MED ORDER — PANTOPRAZOLE SODIUM 40 MG PO TBEC
40.0000 mg | DELAYED_RELEASE_TABLET | Freq: Every day | ORAL | Status: DC | PRN
  Administered 2023-07-12: 40 mg via ORAL
  Filled 2023-07-11: qty 1

## 2023-07-11 MED ORDER — OMEPRAZOLE MAGNESIUM 20 MG PO TBEC: 20.0000 mg | DELAYED_RELEASE_TABLET | Freq: Every day | ORAL | Status: DC | PRN

## 2023-07-11 MED ORDER — METHOCARBAMOL 500 MG PO TABS
500.0000 mg | ORAL_TABLET | Freq: Three times a day (TID) | ORAL | Status: DC | PRN
  Administered 2023-07-11 – 2023-07-13 (×5): 500 mg via ORAL
  Filled 2023-07-11 (×5): qty 1

## 2023-07-11 MED ORDER — FENTANYL CITRATE (PF) 100 MCG/2ML IJ SOLN
INTRAMUSCULAR | Status: DC | PRN
  Administered 2023-07-11: 100 ug via INTRAVENOUS
  Administered 2023-07-11 (×3): 50 ug via INTRAVENOUS

## 2023-07-11 MED ORDER — ADULT MULTIVITAMIN W/MINERALS CH
1.0000 | ORAL_TABLET | Freq: Every day | ORAL | Status: DC
  Administered 2023-07-12 – 2023-07-13 (×2): 1 via ORAL
  Filled 2023-07-11 (×2): qty 1

## 2023-07-11 MED ORDER — ACETAMINOPHEN 500 MG PO TABS: 1000.0000 mg | ORAL_TABLET | Freq: Four times a day (QID) | ORAL | Status: DC | PRN

## 2023-07-11 MED ORDER — ACETAMINOPHEN 10 MG/ML IV SOLN
INTRAVENOUS | Status: AC
  Filled 2023-07-11: qty 100

## 2023-07-11 MED ORDER — THERA M PLUS PO TABS: 1.0000 | ORAL_TABLET | Freq: Every day | ORAL | Status: DC

## 2023-07-11 MED ORDER — CO Q 10 10 MG PO CAPS: ORAL_CAPSULE | Freq: Every day | ORAL | Status: DC

## 2023-07-11 MED ORDER — ROCURONIUM BROMIDE 10 MG/ML (PF) SYRINGE
PREFILLED_SYRINGE | INTRAVENOUS | Status: AC
  Filled 2023-07-11: qty 10

## 2023-07-11 MED ORDER — PROMETHAZINE HCL 25 MG/ML IJ SOLN: 6.2500 mg | INTRAMUSCULAR | Status: DC | PRN

## 2023-07-11 MED ORDER — CEFAZOLIN SODIUM-DEXTROSE 2-4 GM/100ML-% IV SOLN
2.0000 g | INTRAVENOUS | Status: AC
  Administered 2023-07-11: 2 g via INTRAVENOUS

## 2023-07-11 MED ORDER — LOSARTAN POTASSIUM 50 MG PO TABS
100.0000 mg | ORAL_TABLET | Freq: Every day | ORAL | Status: DC
  Administered 2023-07-12 – 2023-07-13 (×2): 100 mg via ORAL
  Filled 2023-07-11 (×2): qty 2

## 2023-07-11 MED ORDER — AMLODIPINE BESYLATE 10 MG PO TABS
10.0000 mg | ORAL_TABLET | Freq: Every day | ORAL | Status: DC
  Filled 2023-07-11: qty 1

## 2023-07-11 SURGICAL SUPPLY — 64 items
ADH SKN CLS APL DERMABOND .7 (GAUZE/BANDAGES/DRESSINGS) ×1
BAG COUNTER SPONGE SURGICOUNT (BAG) IMPLANT
BAG SPEC THK2 15X12 ZIP CLS (MISCELLANEOUS) ×1
BAG SPNG CNTER NS LX DISP (BAG)
BAG ZIPLOCK 12X15 (MISCELLANEOUS) ×1 IMPLANT
BIT DRILL EVOS LG 2.5W/AO SHRT (DRILL) IMPLANT
BIT DRILL SHORT QC SHORT 3.7 (DRILL) IMPLANT
BIT DRILL SURG QC LONG 3.7 (DRILL) IMPLANT
BNDG CMPR MED 10X6 ELC LF (GAUZE/BANDAGES/DRESSINGS) ×1
BNDG ELASTIC 6X10 VLCR STRL LF (GAUZE/BANDAGES/DRESSINGS) IMPLANT
CABLE EVOS SS W/CRIMPÃ¢â‚¬Æ’ (Screw) ×2 IMPLANT
CABLE EVOS SS W/CRIMPÃ‚Ã‚â‚¬Ã‚Æ’ (Screw) IMPLANT
COVER SURGICAL LIGHT HANDLE (MISCELLANEOUS) ×1 IMPLANT
DERMABOND ADVANCED .7 DNX12 (GAUZE/BANDAGES/DRESSINGS) ×1 IMPLANT
DRAPE C-ARM 42X120 X-RAY (DRAPES) ×1 IMPLANT
DRAPE C-ARMOR (DRAPES) IMPLANT
DRAPE ORTHO SPLIT 77X108 STRL (DRAPES) ×2
DRAPE SURG ORHT 6 SPLT 77X108 (DRAPES) ×2 IMPLANT
DRAPE U-SHAPE 47X51 STRL (DRAPES) ×1 IMPLANT
DRILL EVOS LG 2.5 W/AO QC SHRT (DRILL) ×1
DRILL SHORT QC SHORT 3.7 (DRILL) ×1
DRILL SURG QC LONG 3.7 (DRILL) ×1
DRSG EMULSION OIL 3X16 NADH (GAUZE/BANDAGES/DRESSINGS) IMPLANT
DURAPREP 26ML APPLICATOR (WOUND CARE) ×1 IMPLANT
ELECT REM PT RETURN 15FT ADLT (MISCELLANEOUS) ×1 IMPLANT
GAUZE PAD ABD 8X10 STRL (GAUZE/BANDAGES/DRESSINGS) IMPLANT
GAUZE SPONGE 4X4 12PLY STRL (GAUZE/BANDAGES/DRESSINGS) IMPLANT
GLOVE BIOGEL PI IND STRL 7.5 (GLOVE) ×2 IMPLANT
GLOVE BIOGEL PI IND STRL 8 (GLOVE) ×1 IMPLANT
GLOVE BIOGEL PI IND STRL 8.5 (GLOVE) ×1 IMPLANT
GLOVE SURG ORTHO 8.5 STRL (GLOVE) ×1 IMPLANT
KIT BASIN OR (CUSTOM PROCEDURE TRAY) ×1 IMPLANT
KIT TURNOVER KIT A (KITS) IMPLANT
MANIFOLD NEPTUNE II (INSTRUMENTS) ×1 IMPLANT
NS IRRIG 1000ML POUR BTL (IV SOLUTION) ×1 IMPLANT
PACK SHOULDER (CUSTOM PROCEDURE TRAY) ×1 IMPLANT
PLATE BONE EVOS 3.5X213 8H (Plate) IMPLANT
PLATE SADDLE EVOS 4.5 W/CABLE (Plate) IMPLANT
PROTECTOR NERVE ULNAR (MISCELLANEOUS) ×1 IMPLANT
SCREW CORT ST EVOS 4.5X26 (Screw) IMPLANT
SCREW LOCK 28X3.5XSTSTRL (Screw) IMPLANT
SCREW LOCK 3.5X14MM EVOS (Screw) IMPLANT
SCREW LOCK 30X3.5XSTSTRL (Screw) IMPLANT
SCREW LOCK 32X3.5XSTSTRL (Screw) IMPLANT
SCREW LOCK ST EVOS 3.5 X 26 (Screw) IMPLANT
SCREW LOCK ST EVOS 3.5X10 (Screw) IMPLANT
SCREW LOCK ST EVOS 4.5X24 (Screw) IMPLANT
SCREW LOCK ST EVOS 4.5X26 (Screw) IMPLANT
SCREW LOCK ST EVOS 4.5X28 (Screw) IMPLANT
SCREW LOCKING 3.5X28 (Screw) ×1 IMPLANT
SCREW LOCKING 3.5X30 (Screw) ×2 IMPLANT
SCREW LOCKING 3.5X32 (Screw) ×1 IMPLANT
SLING ARM FOAM STRAP LRG (SOFTGOODS) ×1 IMPLANT
SLING ARM FOAM STRAP MED (SOFTGOODS) ×1 IMPLANT
SLING ARM FOAM STRAP XLG (SOFTGOODS) IMPLANT
SUT MNCRL AB 3-0 PS2 18 (SUTURE) ×1 IMPLANT
SUT MON AB 2-0 CT1 36 (SUTURE) ×1 IMPLANT
SUT VIC AB 0 CT1 36 (SUTURE) IMPLANT
SUT VIC AB 1 CT1 36 (SUTURE) ×1 IMPLANT
SUT VIC AB 2-0 CT1 27 (SUTURE) ×4
SUT VIC AB 2-0 CT1 TAPERPNT 27 (SUTURE) IMPLANT
TOWEL OR 17X26 10 PK STRL BLUE (TOWEL DISPOSABLE) ×1 IMPLANT
TOWEL OR NON WOVEN STRL DISP B (DISPOSABLE) ×1 IMPLANT
WATER STERILE IRR 1000ML POUR (IV SOLUTION) ×1 IMPLANT

## 2023-07-11 NOTE — Interval H&P Note (Signed)
History and Physical Interval Note:  07/11/2023 7:13 AM  Keith Oconnell  has presented today for surgery, with the diagnosis of LEFT HUMERAL FRACTURE.  The various methods of treatment have been discussed with the patient and family. After consideration of risks, benefits and other options for treatment, the patient has consented to  Procedure(s): OPEN REDUCTION INTERNAL FIXATION (ORIF) HUMERAL SHAFT FRACTURE (Left) as a surgical intervention.  The patient's history has been reviewed, patient examined, no change in status, stable for surgery.  I have reviewed the patient's chart and labs.  Questions were answered to the patient's satisfaction.     Verlee Rossetti

## 2023-07-11 NOTE — Anesthesia Procedure Notes (Signed)
Procedure Name: Intubation Date/Time: 07/11/2023 7:51 AM  Performed by: Epimenio Sarin, CRNAPre-anesthesia Checklist: Patient identified, Emergency Drugs available, Suction available, Patient being monitored and Timeout performed Patient Re-evaluated:Patient Re-evaluated prior to induction Oxygen Delivery Method: Circle system utilized Preoxygenation: Pre-oxygenation with 100% oxygen Induction Type: IV induction Ventilation: Mask ventilation without difficulty and Oral airway inserted - appropriate to patient size Laryngoscope Size: Mac and 4 Grade View: Grade I Tube type: Oral Tube size: 7.5 mm Number of attempts: 1 Airway Equipment and Method: Stylet Placement Confirmation: ETT inserted through vocal cords under direct vision, positive ETCO2 and breath sounds checked- equal and bilateral Secured at: 23 cm Tube secured with: Tape Dental Injury: Teeth and Oropharynx as per pre-operative assessment
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
## 2023-07-11 NOTE — Discharge Instructions (Signed)
Ice to the shoulder constantly.  Keep the incision covered and clean and dry for one week, then ok to remove the bandage and get it wet in the shower. Leave the incision uncovered after one week  Do hand and wrist ROM exercise as instructed several times per day. Do gentle shoulder exercises as instructed  DO NOT push pull or lift or reach behind your back or push up out of a chair with the operative arm.  Use a sling while you are up and around for comfort, may remove while seated.  Keep pillow propped behind the operative elbow.  Follow up with Dr Ranell Patrick in two weeks in the office, call [phone] for appt
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
## 2023-07-11 NOTE — Transfer of Care (Signed)
Immediate Anesthesia Transfer of Care Note  Patient: Keith Oconnell  Procedure(s) Performed: OPEN REDUCTION INTERNAL FIXATION (ORIF) HUMERAL SHAFT FRACTURE (Left: Shoulder)  Patient Location: PACU  Anesthesia Type:General  Level of Consciousness: drowsy and patient cooperative  Airway & Oxygen Therapy: Patient Spontanous Breathing and Patient connected to face mask oxygen  Post-op Assessment: Report given to RN and Post -op Vital signs reviewed and stable  Post vital signs: Reviewed and stable  Last Vitals:  Vitals Value Taken Time  BP 134/63 07/11/23 1041  Temp    Pulse 69 07/11/23 1043  Resp 16 07/11/23 1043  SpO2 100 % 07/11/23 1043  Vitals shown include unfiled device data.  Last Pain:  Vitals:   07/11/23 0602  PainSc: 8          Complications: No notable events documented.

## 2023-07-11 NOTE — Anesthesia Postprocedure Evaluation (Addendum)
Anesthesia Post Note  Patient: Keith Oconnell  Procedure(s) Performed: OPEN REDUCTION INTERNAL FIXATION (ORIF) HUMERAL SHAFT FRACTURE (Left: Shoulder)     Patient location during evaluation: PACU Anesthesia Type: General Level of consciousness: awake and alert Pain management: pain level controlled Vital Signs Assessment: post-procedure vital signs reviewed and stable Respiratory status: spontaneous breathing, nonlabored ventilation, respiratory function stable and patient connected to nasal cannula oxygen Cardiovascular status: blood pressure returned to baseline and stable Postop Assessment: no apparent nausea or vomiting Anesthetic complications: no  No notable events documented.  Last Vitals:  Vitals:   07/11/23 1416 07/11/23 1638  BP: (!) 144/60   Pulse: 69 78  Resp: 17   Temp: 36.7 C   SpO2: 97% 96%                 Shelton Silvas

## 2023-07-11 NOTE — Brief Op Note (Signed)
07/11/2023  10:37 AM  PATIENT:  Keith Oconnell  71 y.o. male  PRE-OPERATIVE DIAGNOSIS:  LEFT DISPLACED HUMERAL SHAFT FRACTURE, PERIPROSTHETIC  POST-OPERATIVE DIAGNOSIS: SAME  PROCEDURE:  Procedure(s): OPEN REDUCTION INTERNAL FIXATION (ORIF) HUMERAL SHAFT FRACTURE (Left) Katrinka Blazing and Nephew 8 Hole Utility Plate  SURGEON:  Surgeons and Role:    Beverely Low, MD - Primary    * Bradly Bienenstock, MD - Assisting  PHYSICIAN ASSISTANT:   ASSISTANTS: Bradly Bienenstock MD  ANESTHESIA:   general  EBL:  300 mL   BLOOD ADMINISTERED:none  DRAINS: none   LOCAL MEDICATIONS USED:  MARCAINE     SPECIMEN:  No Specimen  DISPOSITION OF SPECIMEN:  N/A  COUNTS:  YES  TOURNIQUET:  * No tourniquets in log *  DICTATION: .Other Dictation: Dictation Number [id number]  PLAN OF CARE: Admit for overnight observation  PATIENT DISPOSITION:  PACU - hemodynamically stable.   Delay start of Pharmacological VTE agent (>24hrs) due to surgical blood loss or risk of bleeding: not applicable
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
## 2023-07-11 NOTE — Progress Notes (Signed)
Notified Alfredo Martinez, PA on call for Dr. Ranell Patrick re pt c/o chest pain. Pt unable to describe the pain but is able to explain that he has never felt this way before. New orders obtained for CXR and 12-lead EKG.  Haydee Salter, RN 07/11/23 7:18 PM

## 2023-07-12 ENCOUNTER — Observation Stay (HOSPITAL_COMMUNITY): Payer: Medicare Other

## 2023-07-12 DIAGNOSIS — T8484XA Pain due to internal orthopedic prosthetic devices, implants and grafts, initial encounter: Secondary | ICD-10-CM | POA: Diagnosis not present

## 2023-07-12 LAB — HEMOGLOBIN AND HEMATOCRIT, BLOOD
HCT: 27.6 % — ABNORMAL LOW (ref 39.0–52.0)
Hemoglobin: 8.7 g/dL — ABNORMAL LOW (ref 13.0–17.0)

## 2023-07-12 LAB — GLUCOSE, CAPILLARY
Glucose-Capillary: 127 mg/dL — ABNORMAL HIGH (ref 70–99)
Glucose-Capillary: 98 mg/dL (ref 70–99)
Glucose-Capillary: 135 mg/dL — ABNORMAL HIGH (ref 70–99)
Glucose-Capillary: 132 mg/dL — ABNORMAL HIGH (ref 70–99)

## 2023-07-12 LAB — BASIC METABOLIC PANEL WITH GFR
Anion gap: 9 (ref 5–15)
BUN: 21 mg/dL (ref 8–23)
CO2: 21 mmol/L — ABNORMAL LOW (ref 22–32)
Calcium: 8.5 mg/dL — ABNORMAL LOW (ref 8.9–10.3)
Chloride: 107 mmol/L (ref 98–111)
Creatinine, Ser: 0.95 mg/dL (ref 0.61–1.24)
GFR, Estimated: >60 mL/min (ref >60)
Glucose, Bld: 123 mg/dL — ABNORMAL HIGH (ref 70–99)
Potassium: 4 mmol/L (ref 3.5–5.1)
Sodium: 137 mmol/L (ref 135–145)

## 2023-07-12 MED ORDER — KETOROLAC TROMETHAMINE 15 MG/ML IJ SOLN
7.5000 mg | Freq: Four times a day (QID) | INTRAMUSCULAR | Status: AC
  Administered 2023-07-12 – 2023-07-13 (×2): 7.5 mg via INTRAVENOUS
  Filled 2023-07-12 (×2): qty 1

## 2023-07-12 MED ORDER — AMLODIPINE BESYLATE 10 MG PO TABS
10.0000 mg | ORAL_TABLET | Freq: Every day | ORAL | Status: DC
  Administered 2023-07-12 – 2023-07-13 (×2): 10 mg via ORAL
  Filled 2023-07-12 (×2): qty 1

## 2023-07-12 MED ORDER — HYDROXYZINE HCL 25 MG PO TABS
25.0000 mg | ORAL_TABLET | Freq: Three times a day (TID) | ORAL | Status: DC | PRN
  Administered 2023-07-12: 25 mg via ORAL
  Filled 2023-07-12: qty 1
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
## 2023-07-12 NOTE — Progress Notes (Signed)
Patient ID: Keith Oconnell, male   DOB: 05-Nov-1951, 72 y.o.   MRN: [id number] Subjective: 1 Day Post-Op Procedure(s) (LRB): OPEN REDUCTION INTERNAL FIXATION (ORIF) HUMERAL SHAFT FRACTURE (Left)    Patient reports pain as moderate to severe Ok if no movement but with any movement it hurts a lot Reviewed chest pain concerns with him It appears more a combination of musculoskeletal as well as some anxiety related to the events as opposed to cardiac EKG ordered and appears to be normal when compared to prior studies  Objective:   VITALS:   Vitals:   07/12/23 0124 07/12/23 0519  BP: (!) 135/59 (!) 156/61  Pulse: 63 62  Resp: 18 18  Temp: 97.8 F (36.6 C) 97.9 F (36.6 C)  SpO2: 98% 99%    Neurovascular intact Incision: dressing C/D/I - left shoulder bulky dressing  LABS Recent Labs    07/11/23 0715 07/12/23 0329  HGB 9.8* 8.7*  HCT 31.0* 27.6*  WBC 7.1  --   PLT 276  --     Recent Labs    07/12/23 0329  NA 137  K 4.0  BUN 21  CREATININE 0.95  GLUCOSE 123*    No results for input(s): "LABPT", "INR" in the last 72 hours.   Assessment/Plan: 1 Day Post-Op Procedure(s) (LRB): OPEN REDUCTION INTERNAL FIXATION (ORIF) HUMERAL SHAFT FRACTURE (Left)   Advance diet I will have him stay today for pain control Orders have been placed, oxycodone and Robaxin for now Ice to left shoulder for pain control Likely home tomorrow

## 2023-07-13 DIAGNOSIS — T8484XA Pain due to internal orthopedic prosthetic devices, implants and grafts, initial encounter: Secondary | ICD-10-CM | POA: Diagnosis not present

## 2023-07-13 LAB — HEMOGLOBIN A1C
Hgb A1c MFr Bld: 5.7 % — ABNORMAL HIGH (ref 4.8–5.6)
Mean Plasma Glucose: 117 mg/dL

## 2023-07-13 LAB — GLUCOSE, CAPILLARY: Glucose-Capillary: 106 mg/dL — ABNORMAL HIGH (ref 70–99)

## 2023-07-13 NOTE — Progress Notes (Signed)
   Subjective: 2 Days Post-Op Procedure(s) (LRB): OPEN REDUCTION INTERNAL FIXATION (ORIF) HUMERAL SHAFT FRACTURE (Left)  Pt c/o mild pain/soreness currently Denies any further chest pain or SOB Otherwise ready for d/c home today Patient reports pain as mild.  Objective:   VITALS:   Vitals:   07/13/23 0647 07/13/23 0928  BP: (!) 140/61 (!) 145/59  Pulse: (!) 53   Resp: 18   Temp: 97.8 F (36.6 C)   SpO2: 99%     Left shoulder: arm incision healing well Dressing changed to aquacel Sling in place Nv intact distally Mild edema and ecchymosis distally  No signs of infection or dvt  LABS Recent Labs    07/11/23 0715 07/12/23 0329  HGB 9.8* 8.7*  HCT 31.0* 27.6*  WBC 7.1  --   PLT 276  --     Recent Labs    07/12/23 0329  NA 137  K 4.0  BUN 21  CREATININE 0.95  GLUCOSE 123*     Assessment/Plan: 2 Days Post-Op Procedure(s) (LRB): OPEN REDUCTION INTERNAL FIXATION (ORIF) HUMERAL SHAFT FRACTURE (Left) Plan to d/c home today after therapy F/u in  the office in 2 weeks No therapy yet at home Sling for support Pain management     Alphonsa Overall PA-C, MPAS [facility] is now [facility]  [place] [address], [address], [place], [place] [address] Phone: [phone] [email or web address] Facebook  [employer]

## 2023-07-13 NOTE — TOC Transition Note (Signed)
Transition of Care [facility]) - CM/SW Discharge Note  Patient Details  Name: Keith Oconnell MRN: [id number] Date of Birth: [birth year]  Transition of Care [facility]) CM/SW Contact:  Ewing Schlein, LCSW Phone Number: 07/13/2023, 1:25 PM  Clinical Narrative: PT/OT recommended HH. Patient agreeable to referral. [facility] unable to accept referral. Cindie with [facility] accepted referral. HH orders placed. Family updated. HH agency information added to AVS.  Final next level of care: Home w Home Health Services Barriers to Discharge: Barriers Resolved  Patient Goals and CMS Choice CMS Medicare.gov Compare Post Acute Care list provided to:: Patient Choice offered to / list presented to : Patient  Discharge Plan and Services Additional resources added to the After Visit Summary for     DME Arranged: N/A DME Agency: NA HH Arranged: PT, OT HH Agency: [facility] Date [facility] Agency Contacted: 07/13/23 Representative spoke with at [facility] Agency: Cindie  Social Determinants of Health (SDOH) Interventions SDOH Screenings   Food Insecurity: No Food Insecurity (07/11/2023)  Housing: Low Risk  (07/11/2023)  Transportation Needs: No Transportation Needs (07/11/2023)  Utilities: Not At Risk (07/11/2023)  Financial Resource Strain: Low Risk  (12/29/2022)   Received from [facility]  Physical Activity: Sufficiently Active (12/29/2022)   Received from [facility]  Social Connections: Socially Integrated (12/29/2022)   Received from [facility]  Stress: No Stress Concern Present (12/29/2022)   Received from [facility]  Tobacco Use: Low Risk  (07/10/2023)   Readmission Risk Interventions    06/18/2023   12:48 PM  Readmission Risk Prevention Plan  Transportation Screening Complete  PCP or Specialist Appt within 5-7 Days Complete  Home Care Screening Complete  Medication Review (RN CM) Complete

## 2023-07-13 NOTE — Discharge Summary (Signed)
In most cases prophylactic antibiotics for Dental procdeures after total joint surgery are not necessary.  Exceptions are as follows:  1. History of prior total joint infection  2. Severely immunocompromised (Organ Transplant, cancer chemotherapy, Rheumatoid biologic meds such as Humera)  3. Poorly controlled diabetes (A1C &gt; 8.0, blood glucose over 200)  If you have one of these conditions, contact your surgeon for an antibiotic prescription, prior to your dental procedure. Orthopedic Discharge Summary        Physician Discharge Summary  Patient ID: Keith Oconnell MRN: [id number] DOB/AGE: 1951/01/11 72 y.o.  Admit date: 07/11/2023 Discharge date: 07/13/2023   Procedures:  Procedure(s) (LRB): OPEN REDUCTION INTERNAL FIXATION (ORIF) HUMERAL SHAFT FRACTURE (Left)  Attending Physician:  Dr. Malon Kindle  Admission Diagnoses:   left periprosthetic humerus fracture  Discharge Diagnoses:  left periprosthetic humerus fracture   Past Medical History:  Diagnosis Date   Arthritis    Diabetes mellitus without complication (HCC)    GERD (gastroesophageal reflux disease)    Heart murmur    never given him any problems   History of blood transfusion    History of elevated glucose    per pt- pre diabetic   History of kidney stones    Hyperlipidemia    Hypertension    Pneumonia    Psoriasis     PCP: Eartha Inch, MD   Discharged Condition: good  Hospital Course:  Patient underwent the above stated procedure on 07/11/2023. Patient tolerated the procedure well and brought to the recovery room in good condition and subsequently to the floor. Patient c/o mild chest pain during therapy due to anxiety and fractured ribs. EKG negative and exam negative. Therapy proceeded well before discharge  Disposition: Discharge disposition: 01-Home or Self Care      with follow up in 2 weeks    Follow-up Information     Beverely Low, MD. Call in 2 week(s).   Specialty:  Orthopedic Surgery Why: call [phone] for appt in two weeks in the office Contact information: [address] [place] [address] [phone]                 Dental Antibiotics:  In most cases prophylactic antibiotics for Dental procdeures after total joint surgery are not necessary.  Exceptions are as follows:  1. History of prior total joint infection  2. Severely immunocompromised (Organ Transplant, cancer chemotherapy, Rheumatoid biologic meds such as Humera)  3. Poorly controlled diabetes (A1C &gt; 8.0, blood glucose over 200)  If you have one of these conditions, contact your surgeon for an antibiotic prescription, prior to your dental procedure.  Discharge Instructions     Call MD / Call 911   Complete by: As directed    If you experience chest pain or shortness of breath, CALL 911 and be transported to the hospital emergency room.  If you develope a fever above 101 F, pus (white drainage) or increased drainage or redness at the wound, or calf pain, call your surgeon's office.   Constipation Prevention   Complete by: As directed    Drink plenty of fluids.  Prune juice may be helpful.  You may use a stool softener, such as Colace (over the counter) 100 mg twice a day.  Use MiraLax (over the counter) for constipation as needed.   Diet - low sodium heart healthy   Complete by: As directed    Increase activity slowly as tolerated   Complete by: As directed  Post-operative opioid taper instructions:   Complete by: As directed    POST-OPERATIVE OPIOID TAPER INSTRUCTIONS: It is important to wean off of your opioid medication as soon as possible. If you do not need pain medication after your surgery it is ok to stop day one. Opioids include: Codeine, Hydrocodone(Norco, Vicodin), Oxycodone(Percocet, oxycontin) and hydromorphone amongst others.  Long term and even short term use of opiods can cause: Increased pain  response Dependence Constipation Depression Respiratory depression And more.  Withdrawal symptoms can include Flu like symptoms Nausea, vomiting And more Techniques to manage these symptoms Hydrate well Eat regular healthy meals Stay active Use relaxation techniques(deep breathing, meditating, yoga) Do Not substitute Alcohol to help with tapering If you have been on opioids for less than two weeks and do not have pain than it is ok to stop all together.  Plan to wean off of opioids This plan should start within one week post op of your joint replacement. Maintain the same interval or time between taking each dose and first decrease the dose.  Cut the total daily intake of opioids by one tablet each day Next start to increase the time between doses. The last dose that should be eliminated is the evening dose.          Allergies as of 07/13/2023       Reactions   Morphine And Codeine Nausea And Vomiting, Nausea Only   Tizanidine    Other Reaction(s): Other Makes mouth dry   Tramadol    Other Reaction(s): Other Kept him up all night and drove him crazy        Medication List     STOP taking these medications    oxyCODONE 5 MG immediate release tablet Commonly known as: Oxy IR/ROXICODONE       TAKE these medications    acetaminophen 500 MG tablet Commonly known as: TYLENOL Take 2 tablets (1,000 mg total) by mouth every 6 (six) hours as needed.   amLODipine 10 MG tablet Commonly known as: NORVASC Take 10 mg by mouth at bedtime.   cholecalciferol 25 MCG (1000 UNIT) tablet Commonly known as: VITAMIN D3 Take 1,000 Units by mouth daily.   CO Q 10 PO Take 1 tablet by mouth daily.   fenofibrate 145 MG tablet Commonly known as: TRICOR Take 145 mg by mouth at bedtime.   gabapentin 100 MG capsule Commonly known as: NEURONTIN Take 400 mg by mouth at bedtime.   Garlique 400 MG Tbec Generic drug: Garlic Take 400 mg by mouth daily.   halobetasol 0.05 %  cream Commonly known as: ULTRAVATE Apply 1 application  topically once a week.   HYDROcodone-acetaminophen 5-325 MG tablet Commonly known as: NORCO/VICODIN Take 1-2 tablets by mouth every 4 (four) hours as needed for moderate pain or severe pain.   losartan-hydrochlorothiazide 100-25 MG tablet Commonly known as: HYZAAR Take 1 tablet by mouth daily.   Magnesium 250 MG Tabs Take 250 mg by mouth daily.   methocarbamol 500 MG tablet Commonly known as: ROBAXIN Take 1 tablet (500 mg total) by mouth every 8 (eight) hours as needed for muscle spasms. What changed: Another medication with the same name was added. Make sure you understand how and when to take each.   methocarbamol 500 MG tablet Commonly known as: ROBAXIN Take 1 tablet (500 mg total) by mouth every 8 (eight) hours as needed for muscle spasms. What changed: You were already taking a medication with the same name, and this prescription was added. Make  sure you understand how and when to take each.   multivitamins ther. w/minerals Tabs tablet Take 1 tablet by mouth daily.   Omega 3 1200 MG Caps Take 1,200-2,400 mg by mouth See admin instructions. Take 1200 mg in the morning 2400 mg at bedtime   omeprazole 20 MG tablet Commonly known as: PRILOSEC OTC Take 20 mg by mouth daily as needed (for reflux).   ONE TOUCH ULTRA 2 w/Device Kit   ONE TOUCH ULTRA TEST test strip Generic drug: glucose blood   pravastatin 40 MG tablet Commonly known as: PRAVACHOL Take 40 mg by mouth at bedtime.   tamsulosin 0.4 MG Caps capsule Commonly known as: FLOMAX Take 1 capsule (0.4 mg total) by mouth daily.          Signed: Thea Gist 07/13/2023, 9:55 AM  [facility] is now [employer] [address], [address], [place], [place] [address] Phone: [phone] Facebook  Instagram  [employer]
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
## 2023-07-14 ENCOUNTER — Encounter (HOSPITAL_COMMUNITY): Payer: Self-pay | Admitting: Orthopedic Surgery

## 2023-07-28 ENCOUNTER — Other Ambulatory Visit: Payer: Self-pay | Admitting: Urology

## 2023-08-05 NOTE — Patient Instructions (Signed)
DUE TO COVID-19 ONLY TWO VISITORS  (aged 73 and older)  ARE ALLOWED TO COME WITH YOU AND STAY IN THE WAITING ROOM ONLY DURING PRE OP AND PROCEDURE.   **NO VISITORS ARE ALLOWED IN THE SHORT STAY AREA OR RECOVERY ROOM!!**  IF YOU WILL BE ADMITTED INTO THE HOSPITAL YOU ARE ALLOWED ONLY FOUR SUPPORT PEOPLE DURING VISITATION HOURS ONLY (7 AM -8PM)   The support person(s) must pass our screening, gel in and out, and wear a mask at all times, including in the patient's room. Patients must also wear a mask when staff or their support person are in the room. Visitors GUEST BADGE MUST BE WORN VISIBLY  One adult visitor may remain with you overnight and MUST be in the room by 8 P.M.     Your procedure is scheduled on: 08/14/23   Report to [facility] Main Entrance    Report to admitting at : 11;30 AM   Call this number if you have problems the morning of surgery [phone]   Do not eat food :After Midnight.   After Midnight you may have the following liquids until : 10:30 AM DAY OF SURGERY  Water Black Coffee (sugar ok, NO MILK/CREAM OR CREAMERS)  Tea (sugar ok, NO MILK/CREAM OR CREAMERS) regular and decaf                             Plain Jell-O (NO RED)                                           Fruit ices (not with fruit pulp, NO RED)                                     Popsicles (NO RED)                                                                  Juice: apple, WHITE grape, WHITE cranberry Sports drinks like Gatorade (NO RED)              FOLLOW ANY ADDITIONAL PRE OP INSTRUCTIONS YOU RECEIVED FROM YOUR SURGEON'S OFFICE!!!   Oral Hygiene is also important to reduce your risk of infection.                                    Remember - BRUSH YOUR TEETH THE MORNING OF SURGERY WITH YOUR REGULAR TOOTHPASTE  DENTURES WILL BE REMOVED PRIOR TO SURGERY PLEASE DO NOT APPLY "Poly grip" OR ADHESIVES!!!   Do NOT smoke after Midnight   Take these medicines the morning of surgery with A  SIP OF WATER: omeprazole.  DO NOT TAKE ANY ORAL DIABETIC MEDICATIONS DAY OF YOUR SURGERY                              You may not have any metal on your body including hair pins, jewelry, and  body piercing             Do not wear lotions, powders, perfumes/cologne, or deodorant              Men may shave face and neck.   Do not bring valuables to the hospital. [facility] IS NOT             RESPONSIBLE   FOR VALUABLES.   Contacts, glasses, or bridgework may not be worn into surgery.   Bring small overnight bag day of surgery.   DO NOT BRING YOUR HOME MEDICATIONS TO THE HOSPITAL. PHARMACY WILL DISPENSE MEDICATIONS LISTED ON YOUR MEDICATION LIST TO YOU DURING YOUR ADMISSION IN THE HOSPITAL!    Patients discharged on the day of surgery will not be allowed to drive home.  Someone NEEDS to stay with you for the first 24 hours after anesthesia.   Special Instructions: Bring a copy of your healthcare power of attorney and living will documents         the day of surgery if you haven't scanned them before.              Please read over the following fact sheets you were given: IF YOU HAVE QUESTIONS ABOUT YOUR PRE-OP INSTRUCTIONS PLEASE CALL [phone]    [facility] - Preparing for Surgery Before surgery, you can play an important role.  Because skin is not sterile, your skin needs to be as free of germs as possible.  You can reduce the number of germs on your skin by washing with CHG (chlorahexidine gluconate) soap before surgery.  CHG is an antiseptic cleaner which kills germs and bonds with the skin to continue killing germs even after washing. Please DO NOT use if you have an allergy to CHG or antibacterial soaps.  If your skin becomes reddened/irritated stop using the CHG and inform your nurse when you arrive at Short Stay. Do not shave (including legs and underarms) for at least 48 hours prior to the first CHG shower.  You may shave your face/neck. Please follow these instructions  carefully:  1.  Shower with CHG Soap the night before surgery and the  morning of Surgery.  2.  If you choose to wash your hair, wash your hair first as usual with your  normal  shampoo.  3.  After you shampoo, rinse your hair and body thoroughly to remove the  shampoo.                           4.  Use CHG as you would any other liquid soap.  You can apply chg directly  to the skin and wash                       Gently with a scrungie or clean washcloth.  5.  Apply the CHG Soap to your body ONLY FROM THE NECK DOWN.   Do not use on face/ open                           Wound or open sores. Avoid contact with eyes, ears mouth and genitals (private parts).                       Wash face,  Genitals (private parts) with your normal soap.  6.  Wash thoroughly, paying special attention to the area where your surgery  will be performed.  7.  Thoroughly rinse your body with warm water from the neck down.  8.  DO NOT shower/wash with your normal soap after using and rinsing off  the CHG Soap.                9.  Pat yourself dry with a clean towel.            10.  Wear clean pajamas.            11.  Place clean sheets on your bed the night of your first shower and do not  sleep with pets. Day of Surgery : Do not apply any lotions/deodorants the morning of surgery.  Please wear clean clothes to the hospital/surgery center.  FAILURE TO FOLLOW THESE INSTRUCTIONS MAY RESULT IN THE CANCELLATION OF YOUR SURGERY PATIENT SIGNATURE_________________________________  NURSE SIGNATURE__________________________________  ________________________________________________________________________

## 2023-08-06 ENCOUNTER — Encounter (HOSPITAL_COMMUNITY): Payer: Self-pay

## 2023-08-06 ENCOUNTER — Other Ambulatory Visit: Payer: Self-pay

## 2023-08-06 ENCOUNTER — Encounter (HOSPITAL_COMMUNITY)
Admission: RE | Admit: 2023-08-06 | Discharge: 2023-08-06 | Disposition: A | Discharge status: Home / Self Care | Payer: Medicare Other | Source: Ambulatory Visit | Attending: Urology | Admitting: Urology

## 2023-08-06 VITALS — BP 160/52 | HR 63 | Temp 97.5°F | Ht 72.0 in | Wt 195.0 lb

## 2023-08-06 DIAGNOSIS — N201 Calculus of ureter: Secondary | ICD-10-CM | POA: Diagnosis not present

## 2023-08-06 DIAGNOSIS — E119 Type 2 diabetes mellitus without complications: Secondary | ICD-10-CM | POA: Diagnosis not present

## 2023-08-06 DIAGNOSIS — Z01818 Encounter for other preprocedural examination: Secondary | ICD-10-CM | POA: Diagnosis present

## 2023-08-06 DIAGNOSIS — I10 Essential (primary) hypertension: Secondary | ICD-10-CM | POA: Diagnosis not present

## 2023-08-06 DIAGNOSIS — E785 Hyperlipidemia, unspecified: Secondary | ICD-10-CM | POA: Diagnosis not present

## 2023-08-06 DIAGNOSIS — K219 Gastro-esophageal reflux disease without esophagitis: Secondary | ICD-10-CM | POA: Insufficient documentation

## 2023-08-06 HISTORY — DX: Dyspnea, unspecified: R06.00

## 2023-08-06 LAB — BASIC METABOLIC PANEL
Anion gap: 11 (ref 5–15)
BUN: 15 mg/dL (ref 8–23)
CO2: 23 mmol/L (ref 22–32)
Calcium: 9.3 mg/dL (ref 8.9–10.3)
Chloride: 104 mmol/L (ref 98–111)
Creatinine, Ser: 1.01 mg/dL (ref 0.61–1.24)
GFR, Estimated: >60 mL/min (ref >60)
Glucose, Bld: 101 mg/dL — ABNORMAL HIGH (ref 70–99)
Potassium: 4.1 mmol/L (ref 3.5–5.1)
Sodium: 138 mmol/L (ref 135–145)

## 2023-08-06 LAB — CBC
HCT: 36.7 % — ABNORMAL LOW (ref 39.0–52.0)
Hemoglobin: 11.3 g/dL — ABNORMAL LOW (ref 13.0–17.0)
MCH: 29.7 pg (ref 26.0–34.0)
MCHC: 30.8 g/dL (ref 30.0–36.0)
MCV: 96.3 fL (ref 80.0–100.0)
Platelets: 261 10*3/uL (ref 150–400)
RBC: 3.81 MIL/uL — ABNORMAL LOW (ref 4.22–5.81)
RDW: 14.8 % (ref 11.5–15.5)
WBC: 4.8 10*3/uL (ref 4.0–10.5)
nRBC: 0 % (ref 0.0–0.2)

## 2023-08-06 LAB — GLUCOSE, CAPILLARY: Glucose-Capillary: 92 mg/dL (ref 70–99)

## 2023-08-06 NOTE — Progress Notes (Addendum)
For Short Stay: COVID SWAB appointment date:  Bowel Prep reminder:   For Anesthesia: PCP - Dr. Casimiro Needle badger Cardiologist - N/A  Chest x-ray - 07/11/23 EKG - 07/11/23 Stress Test -  ECHO - 10/07/18: [facility] Cardiac Cath -  Pacemaker/ICD device last checked: Pacemaker orders received: Device Rep notified:  Spinal Cord Stimulator: N/A  Sleep Study - N/A CPAP -   Fasting Blood Sugar - 100's Checks Blood Sugar __2___ times a day Date and result of last Hgb A1c- 5.7: 07/11/23  Last dose of GLP1 agonist- N/A GLP1 instructions:   Last dose of SGLT-2 inhibitors- N/A SGLT-2 instructions:   Blood Thinner Instructions: N/A Aspirin Instructions: On hold. Last Dose:  Activity level: Can go up a flight of stairs and activities of daily living without stopping and without chest pain and/or shortness of breath   Able to exercise without chest pain and/or shortness of breath  Anesthesia review: Hx: HTN,DIA,Heart murmur.  Patient denies shortness of breath, fever, cough and chest pain at PAT appointment   Patient verbalized understanding of instructions that were given to them at the PAT appointment. Patient was also instructed that they will need to review over the PAT instructions again at home before surgery.
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
## 2023-08-14 ENCOUNTER — Other Ambulatory Visit: Payer: Self-pay

## 2023-08-14 ENCOUNTER — Ambulatory Visit (HOSPITAL_BASED_OUTPATIENT_CLINIC_OR_DEPARTMENT_OTHER): Payer: Medicare Other | Admitting: Anesthesiology

## 2023-08-14 ENCOUNTER — Encounter (HOSPITAL_COMMUNITY): Payer: Self-pay | Admitting: Urology

## 2023-08-14 ENCOUNTER — Ambulatory Visit (HOSPITAL_COMMUNITY)
Admission: RE | Admit: 2023-08-14 | Discharge: 2023-08-14 | Disposition: A | Discharge status: Home / Self Care | Payer: Medicare Other | Source: Ambulatory Visit | Attending: Urology | Admitting: Urology

## 2023-08-14 ENCOUNTER — Ambulatory Visit (HOSPITAL_COMMUNITY): Payer: Medicare Other | Admitting: Medical

## 2023-08-14 ENCOUNTER — Ambulatory Visit (HOSPITAL_COMMUNITY): Payer: Medicare Other

## 2023-08-14 ENCOUNTER — Encounter (HOSPITAL_COMMUNITY)
Admission: RE | Disposition: A | Discharge status: Home / Self Care | Payer: Self-pay | Source: Ambulatory Visit | Attending: Urology

## 2023-08-14 DIAGNOSIS — E119 Type 2 diabetes mellitus without complications: Secondary | ICD-10-CM | POA: Diagnosis not present

## 2023-08-14 DIAGNOSIS — I10 Essential (primary) hypertension: Secondary | ICD-10-CM | POA: Insufficient documentation

## 2023-08-14 DIAGNOSIS — N132 Hydronephrosis with renal and ureteral calculous obstruction: Secondary | ICD-10-CM | POA: Diagnosis present

## 2023-08-14 DIAGNOSIS — N400 Enlarged prostate without lower urinary tract symptoms: Secondary | ICD-10-CM | POA: Diagnosis not present

## 2023-08-14 HISTORY — PX: CYSTOSCOPY WITH RETROGRADE PYELOGRAM, URETEROSCOPY AND STENT PLACEMENT: SHX5789

## 2023-08-14 LAB — GLUCOSE, CAPILLARY
Glucose-Capillary: 106 mg/dL — ABNORMAL HIGH (ref 70–99)
Glucose-Capillary: 102 mg/dL — ABNORMAL HIGH (ref 70–99)

## 2023-08-14 SURGERY — CYSTOURETEROSCOPY, WITH RETROGRADE PYELOGRAM AND STENT INSERTION
Anesthesia: General | Laterality: Left

## 2023-08-14 MED ORDER — FENTANYL CITRATE PF 50 MCG/ML IJ SOSY: 25.0000 ug | PREFILLED_SYRINGE | INTRAMUSCULAR | Status: DC | PRN

## 2023-08-14 MED ORDER — DROPERIDOL 2.5 MG/ML IJ SOLN: 0.6250 mg | Freq: Once | INTRAMUSCULAR | Status: DC | PRN

## 2023-08-14 MED ORDER — ORAL CARE MOUTH RINSE: 15.0000 mL | Freq: Once | OROMUCOSAL | Status: AC

## 2023-08-14 MED ORDER — PROPOFOL 10 MG/ML IV BOLUS
INTRAVENOUS | Status: AC
  Filled 2023-08-14: qty 20

## 2023-08-14 MED ORDER — DEXAMETHASONE SODIUM PHOSPHATE 10 MG/ML IJ SOLN
INTRAMUSCULAR | Status: DC | PRN
  Administered 2023-08-14: 10 mg via INTRAVENOUS

## 2023-08-14 MED ORDER — LIDOCAINE 2% (20 MG/ML) 5 ML SYRINGE
INTRAMUSCULAR | Status: DC | PRN
  Administered 2023-08-14: 100 mg via INTRAVENOUS

## 2023-08-14 MED ORDER — KETOROLAC TROMETHAMINE 30 MG/ML IJ SOLN
INTRAMUSCULAR | Status: DC | PRN
Start: 2023-08-14 — End: 2023-08-14
  Administered 2023-08-14: 30 mg via INTRAVENOUS

## 2023-08-14 MED ORDER — CHLORHEXIDINE GLUCONATE 0.12 % MT SOLN
15.0000 mL | Freq: Once | OROMUCOSAL | Status: AC
  Administered 2023-08-14: 15 mL via OROMUCOSAL

## 2023-08-14 MED ORDER — LIDOCAINE HCL (PF) 2 % IJ SOLN
INTRAMUSCULAR | Status: AC
  Filled 2023-08-14: qty 5

## 2023-08-14 MED ORDER — KETOROLAC TROMETHAMINE 30 MG/ML IJ SOLN
INTRAMUSCULAR | Status: AC
  Filled 2023-08-14: qty 1

## 2023-08-14 MED ORDER — LACTATED RINGERS IV SOLN: INTRAVENOUS | Status: DC

## 2023-08-14 MED ORDER — HYDROCODONE-ACETAMINOPHEN 5-325 MG PO TABS: 1.0000 | ORAL_TABLET | ORAL | 0 refills | Status: DC | PRN

## 2023-08-14 MED ORDER — ACETAMINOPHEN 10 MG/ML IV SOLN: 1000.0000 mg | Freq: Once | INTRAVENOUS | Status: DC | PRN

## 2023-08-14 MED ORDER — OXYCODONE HCL 5 MG/5ML PO SOLN: 5.0000 mg | Freq: Once | ORAL | Status: DC | PRN

## 2023-08-14 MED ORDER — ONDANSETRON HCL 4 MG/2ML IJ SOLN
INTRAMUSCULAR | Status: DC | PRN
  Administered 2023-08-14: 4 mg via INTRAVENOUS

## 2023-08-14 MED ORDER — OXYCODONE HCL 5 MG PO TABS: 5.0000 mg | ORAL_TABLET | Freq: Once | ORAL | Status: DC | PRN

## 2023-08-14 MED ORDER — PROPOFOL 10 MG/ML IV BOLUS
INTRAVENOUS | Status: DC | PRN
  Administered 2023-08-14: 160 mg via INTRAVENOUS

## 2023-08-14 MED ORDER — ONDANSETRON HCL 4 MG/2ML IJ SOLN
INTRAMUSCULAR | Status: AC
  Filled 2023-08-14: qty 2

## 2023-08-14 MED ORDER — SODIUM CHLORIDE 0.9 % IR SOLN
Status: DC | PRN
  Administered 2023-08-14: 3000 mL via INTRAVESICAL

## 2023-08-14 MED ORDER — FENTANYL CITRATE (PF) 100 MCG/2ML IJ SOLN
INTRAMUSCULAR | Status: DC | PRN
  Administered 2023-08-14 (×2): 50 ug via INTRAVENOUS

## 2023-08-14 MED ORDER — FENTANYL CITRATE (PF) 100 MCG/2ML IJ SOLN
INTRAMUSCULAR | Status: AC
  Filled 2023-08-14: qty 2

## 2023-08-14 MED ORDER — DEXAMETHASONE SODIUM PHOSPHATE 10 MG/ML IJ SOLN
INTRAMUSCULAR | Status: AC
  Filled 2023-08-14: qty 1

## 2023-08-14 MED ORDER — IOHEXOL 300 MG/ML  SOLN
INTRAMUSCULAR | Status: DC | PRN
  Administered 2023-08-14: 10 mL

## 2023-08-14 MED ORDER — CEFAZOLIN SODIUM-DEXTROSE 2-4 GM/100ML-% IV SOLN
2.0000 g | INTRAVENOUS | Status: AC
  Administered 2023-08-14: 2 g via INTRAVENOUS
  Filled 2023-08-14: qty 100

## 2023-08-14 SURGICAL SUPPLY — 25 items
BAG URO CATCHER STRL LF (MISCELLANEOUS) ×1 IMPLANT
BASKET LASER NITINOL 1.9FR (BASKET) IMPLANT
BASKET ZERO TIP NITINOL 2.4FR (BASKET) IMPLANT
BSKT STON RTRVL 120 1.9FR (BASKET)
BSKT STON RTRVL ZERO TP 2.4FR (BASKET)
CATH URETERAL DUAL LUMEN 10F (MISCELLANEOUS) IMPLANT
CATH URETL OPEN END 6FR 70 (CATHETERS) ×1 IMPLANT
CLOTH BEACON ORANGE TIMEOUT ST (SAFETY) ×1 IMPLANT
EXTRACTOR STONE 1.7FRX115CM (UROLOGICAL SUPPLIES) IMPLANT
GLOVE BIO SURGEON STRL SZ7.5 (GLOVE) ×1 IMPLANT
GOWN STRL REUS W/ TWL XL LVL3 (GOWN DISPOSABLE) ×1 IMPLANT
GOWN STRL REUS W/TWL XL LVL3 (GOWN DISPOSABLE) ×1
GUIDEWIRE ANG ZIPWIRE 038X150 (WIRE) IMPLANT
GUIDEWIRE STR DUAL SENSOR (WIRE) ×1 IMPLANT
KIT TURNOVER KIT A (KITS) IMPLANT
LASER FIB FLEXIVA PULSE ID 365 (Laser) IMPLANT
MANIFOLD NEPTUNE II (INSTRUMENTS) ×1 IMPLANT
PACK CYSTO (CUSTOM PROCEDURE TRAY) ×1 IMPLANT
SHEATH NAVIGATOR HD 11/13X28 (SHEATH) IMPLANT
SHEATH NAVIGATOR HD 11/13X36 (SHEATH) IMPLANT
STENT URET 6FRX26 CONTOUR (STENTS) IMPLANT
TRACTIP FLEXIVA PULS ID 200XHI (Laser) IMPLANT
TRACTIP FLEXIVA PULSE ID 200 (Laser) ×1
TUBING CONNECTING 10 (TUBING) ×1 IMPLANT
TUBING UROLOGY SET (TUBING) ×1 IMPLANT

## 2023-08-14 NOTE — Anesthesia Postprocedure Evaluation (Signed)
Anesthesia Post Note  Patient: Keith Oconnell  Procedure(s) Performed: CYSTOSCOPY WITH LEFT RETROGRADE PYELOGRAM, URETEROSCOPY HOLMIUM LASER AND STENT PLACEMENT (Left)     Patient location during evaluation: PACU Anesthesia Type: General Level of consciousness: awake and alert Pain management: pain level controlled Vital Signs Assessment: post-procedure vital signs reviewed and stable Respiratory status: spontaneous breathing, nonlabored ventilation, respiratory function stable and patient connected to nasal cannula oxygen Cardiovascular status: blood pressure returned to baseline and stable Postop Assessment: no apparent nausea or vomiting Anesthetic complications: no   No notable events documented.  Last Vitals:  Vitals:   08/14/23 1600 08/14/23 1630  BP: (!) 180/67 (!) 186/82  Pulse: 62 62  Resp: 20 15  Temp:  36.7 C  SpO2: 98% 99%    Last Pain:  Vitals:   08/14/23 1630  TempSrc:   PainSc: 0-No pain                 [place]

## 2023-08-14 NOTE — Transfer of Care (Signed)
Immediate Anesthesia Transfer of Care Note  Patient: Keith Oconnell  Procedure(s) Performed: CYSTOSCOPY WITH LEFT RETROGRADE PYELOGRAM, URETEROSCOPY HOLMIUM LASER AND STENT PLACEMENT (Left)  Patient Location: PACU  Anesthesia Type:General  Level of Consciousness: awake and alert   Airway & Oxygen Therapy: Patient Spontanous Breathing and Patient connected to face mask oxygen  Post-op Assessment: Report given to RN and Post -op Vital signs reviewed and stable  Post vital signs: Reviewed and stable  Last Vitals:  Vitals Value Taken Time  BP 190/75 08/14/23 1517  Temp    Pulse 70 08/14/23 1518  Resp 22 08/14/23 1518  SpO2 100 % 08/14/23 1518  Vitals shown include unfiled device data.  Last Pain:  Vitals:   08/14/23 1200  TempSrc:   PainSc: 0-No pain         Complications: No notable events documented.

## 2023-08-14 NOTE — Discharge Instructions (Signed)
[facility] [phone] Post Ureteroscopy With or Without Stent Instructions  Definitions:  Ureter: The duct that transports urine from the kidney to the bladder. Stent:   A plastic hollow tube that is placed into the ureter, from the kidney to the                 bladder to prevent the ureter from swelling shut.  GENERAL INSTRUCTIONS:  Despite the fact that no skin incisions were used, the area around the ureter and bladder is raw and irritated. The stent is a foreign body which will further irritate the bladder wall. This irritation is manifested by increased frequency of urination, both day and night, and by an increase in the urge to urinate. In some, the urge to urinate is present almost always. Sometimes the urge is strong enough that you may not be able to stop yourself from urinating. The only real cure is to remove the stent and then give time for the bladder wall to heal which can't be done until the danger of the ureter swelling shut has passed, which varies.  You may see some blood in your urine while the stent is in place and a few days afterwards. Do not be alarmed, even if the urine was clear for a while. Get off your feet and drink lots of fluids until clearing occurs. If you start to pass clots or don't improve, call us.  DIET: You may return to your normal diet immediately. Because of the raw surface of your bladder, alcohol, spicy foods, acid type foods and drinks with caffeine may cause irritation or frequency and should be used in moderation. To keep your urine flowing freely and to avoid constipation, drink plenty of fluids during the day ( 8-10 glasses ). Tip: Avoid cranberry juice because it is very acidic.  ACTIVITY: Your physical activity doesn't need to be restricted. However, if you are very active, you may see some blood in your urine. We suggest that you reduce your activity under these circumstances until the bleeding has stopped.  BOWELS: It is  important to keep your bowels regular during the postoperative period. Straining with bowel movements can cause bleeding. A bowel movement every other day is reasonable. Use a mild laxative if needed, such as Milk of Magnesia 2-3 tablespoons, or 2 Dulcolax tablets. Call if you continue to have problems. If you have been taking narcotics for pain, before, during or after your surgery, you may be constipated. Take a laxative if necessary.   MEDICATION: You should resume your pre-surgery medications unless told not to. You may take oxybutynin or flomax if prescribed for bladder spasms or discomfort from the stent Take pain medication as directed for pain refractory to conservative management  PROBLEMS YOU SHOULD REPORT TO [place]:  Fevers over 100.5 Fahrenheit.  Heavy bleeding, or clots ( See above notes about blood in urine ).  Inability to urinate.  Drug reactions ( hives, rash, nausea, vomiting, diarrhea ).  Severe burning or pain with urination that is not improving.

## 2023-08-14 NOTE — Op Note (Signed)
Operative Note  Preoperative diagnosis:  1.  Left ureteral stone  Postoperative diagnosis: 1.  Left ureteral stone  Procedure(s): 1.  Cystoscopy with left retrograde pyelogram, left ureteroscopy with laser lithotripsy and ureteral stent placement  Surgeon: Modena Slater, MD  Assistants: None  Anesthesia: General  Complications: None immediate  EBL: Minimal  Specimens: 1.  None  Drains/Catheters: 1.  6 x 26 double-J ureteral stent  Intraoperative findings: 1.  Normal anterior urethra 2.  Moderately obstructing prostate 3.  Bladder mucosa without any tumors or masses 4.  Left retrograde pyelogram revealed mild hydronephrosis.  No filling defect in the kidney after treatment of the stone. 5.  Left ureteroscopy confirmed a 1 cm calculus that was laser fragmented all dust settings to tiny fragments  Indication: 72 year old male with a left ureteral calculus failed ESWL presents for previously mentioned operation.  Description of procedure:  The patient was identified and consent was obtained.  The patient was taken to the operating room and placed in the supine position.  The patient was placed under general anesthesia.  Perioperative antibiotics were administered.  The patient was placed in dorsal lithotomy.  Patient was prepped and draped in a standard sterile fashion and a timeout was performed.  A 21 French rigid cystoscope was advanced into the urethra and into the bladder.  Complete cystoscopy was performed with no abnormal findings.  The left ureter was cannulated with a wire which was advanced up to the kidney under fluoroscopic guidance.  Semirigid ureteroscopy was performed alongside the wire up to the stone which was laser fragmented all dust settings to tiny fragments.  These were too small to basket.  I advanced the scope to the renal pelvis and no other calculi were seen.  I shot a retrograde pyelogram through the scope with findings noted above.  I withdrew the scope  visualizing the ureter upon removal and all stone fragments were tiny with no clinically significant stone fragments remaining.  I backloaded the wire onto rigid cystoscope and advanced that into the bladder followed by routine placement of a 6 x 26 double-J ureteral stent.  Fluoroscopy confirmed proximal placement and direct visualization confirmed a good coil within the bladder.  I drained the bladder and withdrew the scope.  Patient tolerated the procedure well was stable postoperatively.  Plan: Follow-up in 1 week for stent removal

## 2023-08-14 NOTE — Anesthesia Procedure Notes (Signed)
Procedure Name: LMA Insertion Date/Time: 08/14/2023 2:41 PM  Performed by: Florene Route, CRNAPatient Re-evaluated:Patient Re-evaluated prior to induction Oxygen Delivery Method: Circle system utilized Preoxygenation: Pre-oxygenation with 100% oxygen Induction Type: IV induction LMA: LMA inserted LMA Size: 4.0 Number of attempts: 1 Placement Confirmation: positive ETCO2 and breath sounds checked- equal and bilateral Tube secured with: Tape Dental Injury: Teeth and Oropharynx as per pre-operative assessment
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
## 2023-08-15 ENCOUNTER — Encounter (HOSPITAL_COMMUNITY): Payer: Self-pay | Admitting: Urology

## 2023-09-01 ENCOUNTER — Ambulatory Visit: Payer: Medicare Other | Attending: Physician Assistant | Admitting: Physical Therapy

## 2023-09-01 ENCOUNTER — Other Ambulatory Visit: Payer: Self-pay

## 2023-09-01 ENCOUNTER — Encounter: Payer: Self-pay | Admitting: Physical Therapy

## 2023-09-01 DIAGNOSIS — R600 Localized edema: Secondary | ICD-10-CM | POA: Insufficient documentation

## 2023-09-01 DIAGNOSIS — M25512 Pain in left shoulder: Secondary | ICD-10-CM | POA: Diagnosis present

## 2023-09-01 DIAGNOSIS — M25612 Stiffness of left shoulder, not elsewhere classified: Secondary | ICD-10-CM | POA: Diagnosis present

## 2023-09-01 DIAGNOSIS — G8929 Other chronic pain: Secondary | ICD-10-CM | POA: Insufficient documentation
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
## 2023-09-03 ENCOUNTER — Encounter: Payer: Self-pay | Admitting: *Deleted

## 2023-09-03 ENCOUNTER — Ambulatory Visit: Payer: Medicare Other | Admitting: *Deleted

## 2023-09-03 DIAGNOSIS — M25512 Pain in left shoulder: Secondary | ICD-10-CM | POA: Diagnosis not present

## 2023-09-03 DIAGNOSIS — M25612 Stiffness of left shoulder, not elsewhere classified: Secondary | ICD-10-CM

## 2023-09-03 DIAGNOSIS — G8929 Other chronic pain: Secondary | ICD-10-CM

## 2023-09-03 DIAGNOSIS — R600 Localized edema: Secondary | ICD-10-CM
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
## 2023-09-08 ENCOUNTER — Ambulatory Visit: Payer: Medicare Other | Admitting: *Deleted

## 2023-09-08 ENCOUNTER — Encounter: Payer: Self-pay | Admitting: *Deleted

## 2023-09-08 DIAGNOSIS — M25612 Stiffness of left shoulder, not elsewhere classified: Secondary | ICD-10-CM

## 2023-09-08 DIAGNOSIS — G8929 Other chronic pain: Secondary | ICD-10-CM

## 2023-09-08 DIAGNOSIS — M25512 Pain in left shoulder: Secondary | ICD-10-CM | POA: Diagnosis not present

## 2023-09-08 DIAGNOSIS — R600 Localized edema: Secondary | ICD-10-CM
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
## 2023-09-10 ENCOUNTER — Ambulatory Visit: Payer: Medicare Other | Admitting: *Deleted

## 2023-09-10 ENCOUNTER — Encounter: Payer: Self-pay | Admitting: *Deleted

## 2023-09-10 DIAGNOSIS — G8929 Other chronic pain: Secondary | ICD-10-CM

## 2023-09-10 DIAGNOSIS — M25512 Pain in left shoulder: Secondary | ICD-10-CM | POA: Diagnosis not present

## 2023-09-10 DIAGNOSIS — R600 Localized edema: Secondary | ICD-10-CM

## 2023-09-10 DIAGNOSIS — M25612 Stiffness of left shoulder, not elsewhere classified: Secondary | ICD-10-CM
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
## 2023-09-16 ENCOUNTER — Ambulatory Visit: Payer: Medicare Other

## 2023-09-16 DIAGNOSIS — M25612 Stiffness of left shoulder, not elsewhere classified: Secondary | ICD-10-CM

## 2023-09-16 DIAGNOSIS — R600 Localized edema: Secondary | ICD-10-CM

## 2023-09-16 DIAGNOSIS — G8929 Other chronic pain: Secondary | ICD-10-CM

## 2023-09-16 DIAGNOSIS — M25512 Pain in left shoulder: Secondary | ICD-10-CM | POA: Diagnosis not present
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
## 2023-09-18 ENCOUNTER — Ambulatory Visit: Payer: Medicare Other

## 2023-09-18 DIAGNOSIS — M25512 Pain in left shoulder: Secondary | ICD-10-CM | POA: Diagnosis not present

## 2023-09-18 DIAGNOSIS — G8929 Other chronic pain: Secondary | ICD-10-CM

## 2023-09-18 DIAGNOSIS — M25612 Stiffness of left shoulder, not elsewhere classified: Secondary | ICD-10-CM

## 2023-09-18 DIAGNOSIS — R600 Localized edema: Secondary | ICD-10-CM
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
## 2023-09-23 ENCOUNTER — Ambulatory Visit: Payer: Medicare Other | Admitting: Physical Therapy

## 2023-09-23 DIAGNOSIS — G8929 Other chronic pain: Secondary | ICD-10-CM

## 2023-09-23 DIAGNOSIS — R600 Localized edema: Secondary | ICD-10-CM

## 2023-09-23 DIAGNOSIS — M25612 Stiffness of left shoulder, not elsewhere classified: Secondary | ICD-10-CM

## 2023-09-23 DIAGNOSIS — M25512 Pain in left shoulder: Secondary | ICD-10-CM | POA: Diagnosis not present
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
## 2023-09-25 ENCOUNTER — Encounter: Payer: Medicare Other | Admitting: Physical Therapy

## 2023-09-28 ENCOUNTER — Ambulatory Visit: Payer: Medicare Other | Admitting: Physical Therapy

## 2023-09-28 DIAGNOSIS — R600 Localized edema: Secondary | ICD-10-CM

## 2023-09-28 DIAGNOSIS — G8929 Other chronic pain: Secondary | ICD-10-CM

## 2023-09-28 DIAGNOSIS — M25612 Stiffness of left shoulder, not elsewhere classified: Secondary | ICD-10-CM

## 2023-09-28 DIAGNOSIS — M25512 Pain in left shoulder: Secondary | ICD-10-CM | POA: Diagnosis not present

## 2023-09-28 NOTE — Therapy (Signed)
to 100 degrees.  Goal status: On going 95 degrees  3.  Gentle passive left shoulder ER to 30 degrees.   Goal status:  MET   4.  Left elbow extension to -25 degrees.  Goal status: MET  LONG TERM GOALS: Target date: 10/13/23.  Ind with advanced HEP.  Goal status: INITIAL  2.  Active left shoulder flexion to 135 degrees so the patient can easily reach overhead.  Goal status: INITIAL  3.  Active ER to 60 degrees+ to allow for easily donning/doffing of apparel.  Goal status: INITIAL  4.  Increase left shoulder strength to a 4/5 to increase stability for performance of functional activities.  Goal status: INITIAL  5.  Left elbow extension to -15 degrees.  Goal status: INITIAL  6.  Perform ADL's with pain not > 3/10.  Goal status: INITIAL  PLAN:  PT FREQUENCY: 2x/week  PT DURATION: 6 weeks  PLANNED INTERVENTIONS: Therapeutic exercises, Therapeutic activity, Neuromuscular re-education, Patient/Family education, Self Care, Electrical stimulation, Cryotherapy, Moist heat, and Manual therapy  PLAN FOR NEXT SESSION: Begin  with gentle P, P/AROM to patient's left shoulder.  Work in improving left elbow extension.  Seated UE Ranger.  Review HEP.  Advance HEP.   Suheily Birks, [place], PT 09/28/2023, 12:04 PM [place] [facility] Outpatient Rehabilitation at [facility] [address] [place], [place], [address] Phone: [phone]   Fax:  [phone]  Patient Details  Name: Keith Oconnell MRN: [id number] Date of Birth: 10-02-1951 Referring Provider:  Eartha Inch, MD  Encounter Date: 09/28/2023   Ivorie Uplinger, [place], PT 09/28/2023, 12:04 PM  [facility] Outpatient Rehabilitation at [facility] [address] [place], [place], [address] Phone: [phone]   Fax:  [phone]  OUTPATIENT PHYSICAL THERAPY SHOULDER TREATMENT   Patient Name: Keith Oconnell MRN: [id number] DOB:03/21/1951, 72 y.o., male Today's Date: 09/28/2023  END OF SESSION:  PT End of Session - 09/28/23 1113     Visit Number 8    Number of Visits 12    Date for PT Re-Evaluation 10/13/23    Authorization Type FOTO.    PT Start Time 1100    PT Stop Time 1155    PT Time Calculation (min) 55 min    Activity Tolerance Patient tolerated treatment well    Behavior During Therapy WFL for tasks assessed/performed              Past Medical History:  Diagnosis Date   Arthritis    Diabetes mellitus without complication (HCC)    Dyspnea    GERD (gastroesophageal reflux disease)    Heart murmur    never given him any problems   History of blood transfusion    History of elevated glucose    per pt- pre diabetic   History of kidney stones    Hyperlipidemia    Hypertension    Pneumonia    Psoriasis    Past Surgical History:  Procedure Laterality Date   BACK SURGERY  1995   lumbar   COLONOSCOPY     CYSTOSCOPY WITH RETROGRADE PYELOGRAM, URETEROSCOPY AND STENT PLACEMENT Left 08/14/2023   Procedure: CYSTOSCOPY WITH LEFT RETROGRADE PYELOGRAM, URETEROSCOPY HOLMIUM LASER AND STENT PLACEMENT;  Surgeon: Crista Elliot, MD;  Location: [facility];  Service: Urology;  Laterality: Left;  60 MINS FOR CASE   DENTAL SURGERY     pins placed upper front tooth   EXTRACORPOREAL SHOCK WAVE LITHOTRIPSY Left 06/29/2023   Procedure: LEFT EXTRACORPOREAL SHOCK WAVE LITHOTRIPSY (ESWL);  Surgeon: Crista Elliot, MD;  Location: [facility];  Service: Urology;  Laterality: Left;   LUMBAR LAMINECTOMY/DECOMPRESSION MICRODISCECTOMY N/A 11/07/2020   Procedure: Lumbar 4-5 Laminectomy/Foraminotomy;  Surgeon: Coletta Memos, MD;  [facility];  Service: Neurosurgery;  Laterality: N/A;  3C/RM 21   ORIF HUMERUS FRACTURE Left 07/11/2023   Procedure: OPEN REDUCTION INTERNAL FIXATION (ORIF) HUMERAL SHAFT  FRACTURE;  Surgeon: Beverely Low, MD;  Location: [facility];  Service: Orthopedics;  Laterality: Left;   REVERSE SHOULDER ARTHROPLASTY Left 03/17/2014   Procedure: LEFT REVERSE SHOULDER ARTHROPLASTY;  Surgeon: Verlee Rossetti, MD;  Location: [facility];  Service: Orthopedics;  Laterality: Left;   SHOULDER OPEN ROTATOR CUFF REPAIR  10/07/2011   Procedure: ROTATOR CUFF REPAIR SHOULDER OPEN;  Surgeon: Jacki Cones;  Location: [facility];  Service: Orthopedics;  Laterality: Right;  Open Rotator Cuff Repair/Acrominectomy with Graft and Anchors   SHOULDER OPEN ROTATOR CUFF REPAIR Left 06/08/2013   Procedure: OPEN LEFT SHOULDER ROTATOR CUFF REPAIR , complex repair with 3 anchors and graft;  Surgeon: Jacki Cones, MD;  [facility];  Service: Orthopedics;  Laterality: Left;   WRIST ARTHROSCOPY Left 10/11/2013   Procedure: LEFT ARTHROSCOPY WRIST DEBRIDMENT/SHRINKAGE;  Surgeon: Nicki Reaper, MD;  Location: [facility];  Service: Orthopedics;  Laterality: Left;   WRIST SURGERY Right    repair fractured wrist   Patient Active Problem List   Diagnosis Date Noted   Fracture closed, humerus, shaft 07/11/2023   Rib fractures 06/16/2023   Spinal stenosis, lumbar region with neurogenic claudication 11/07/2020   Osteoarthritis of left shoulder due to rotator cuff injury 03/17/2014   Torn rotator cuff 10/08/2011    REFERRING PROVIDER: Standley Dakins PA-C  OUTPATIENT PHYSICAL THERAPY SHOULDER TREATMENT   Patient Name: Keith Oconnell MRN: [id number] DOB:03/21/1951, 72 y.o., male Today's Date: 09/28/2023  END OF SESSION:  PT End of Session - 09/28/23 1113     Visit Number 8    Number of Visits 12    Date for PT Re-Evaluation 10/13/23    Authorization Type FOTO.    PT Start Time 1100    PT Stop Time 1155    PT Time Calculation (min) 55 min    Activity Tolerance Patient tolerated treatment well    Behavior During Therapy WFL for tasks assessed/performed              Past Medical History:  Diagnosis Date   Arthritis    Diabetes mellitus without complication (HCC)    Dyspnea    GERD (gastroesophageal reflux disease)    Heart murmur    never given him any problems   History of blood transfusion    History of elevated glucose    per pt- pre diabetic   History of kidney stones    Hyperlipidemia    Hypertension    Pneumonia    Psoriasis    Past Surgical History:  Procedure Laterality Date   BACK SURGERY  1995   lumbar   COLONOSCOPY     CYSTOSCOPY WITH RETROGRADE PYELOGRAM, URETEROSCOPY AND STENT PLACEMENT Left 08/14/2023   Procedure: CYSTOSCOPY WITH LEFT RETROGRADE PYELOGRAM, URETEROSCOPY HOLMIUM LASER AND STENT PLACEMENT;  Surgeon: Crista Elliot, MD;  [facility];  Service: Urology;  Laterality: Left;  60 MINS FOR CASE   DENTAL SURGERY     pins placed upper front tooth   EXTRACORPOREAL SHOCK WAVE LITHOTRIPSY Left 06/29/2023   Procedure: LEFT EXTRACORPOREAL SHOCK WAVE LITHOTRIPSY (ESWL);  Surgeon: Crista Elliot, MD;  Location: [facility];  Service: Urology;  Laterality: Left;   LUMBAR LAMINECTOMY/DECOMPRESSION MICRODISCECTOMY N/A 11/07/2020   Procedure: Lumbar 4-5 Laminectomy/Foraminotomy;  Surgeon: Coletta Memos, MD;  [facility];  Service: Neurosurgery;  Laterality: N/A;  3C/RM 21   ORIF HUMERUS FRACTURE Left 07/11/2023   Procedure: OPEN REDUCTION INTERNAL FIXATION (ORIF) HUMERAL SHAFT  FRACTURE;  Surgeon: Beverely Low, MD;  [facility];  Service: Orthopedics;  Laterality: Left;   REVERSE SHOULDER ARTHROPLASTY Left 03/17/2014   Procedure: LEFT REVERSE SHOULDER ARTHROPLASTY;  Surgeon: Verlee Rossetti, MD;  Location: [facility];  Service: Orthopedics;  Laterality: Left;   SHOULDER OPEN ROTATOR CUFF REPAIR  10/07/2011   Procedure: ROTATOR CUFF REPAIR SHOULDER OPEN;  Surgeon: Jacki Cones;  [facility];  Service: Orthopedics;  Laterality: Right;  Open Rotator Cuff Repair/Acrominectomy with Graft and Anchors   SHOULDER OPEN ROTATOR CUFF REPAIR Left 06/08/2013   Procedure: OPEN LEFT SHOULDER ROTATOR CUFF REPAIR , complex repair with 3 anchors and graft;  Surgeon: Jacki Cones, MD;  [facility];  Service: Orthopedics;  Laterality: Left;   WRIST ARTHROSCOPY Left 10/11/2013   Procedure: LEFT ARTHROSCOPY WRIST DEBRIDMENT/SHRINKAGE;  Surgeon: Nicki Reaper, MD;  Location: [facility];  Service: Orthopedics;  Laterality: Left;   WRIST SURGERY Right    repair fractured wrist   Patient Active Problem List   Diagnosis Date Noted   Fracture closed, humerus, shaft 07/11/2023   Rib fractures 06/16/2023   Spinal stenosis, lumbar region with neurogenic claudication 11/07/2020   Osteoarthritis of left shoulder due to rotator cuff injury 03/17/2014   Torn rotator cuff 10/08/2011    REFERRING PROVIDER: Standley Dakins PA-C  OUTPATIENT PHYSICAL THERAPY SHOULDER TREATMENT   Patient Name: Keith Oconnell MRN: [id number] DOB:03/21/1951, 72 y.o., male Today's Date: 09/28/2023  END OF SESSION:  PT End of Session - 09/28/23 1113     Visit Number 8    Number of Visits 12    Date for PT Re-Evaluation 10/13/23    Authorization Type FOTO.    PT Start Time 1100    PT Stop Time 1155    PT Time Calculation (min) 55 min    Activity Tolerance Patient tolerated treatment well    Behavior During Therapy WFL for tasks assessed/performed              Past Medical History:  Diagnosis Date   Arthritis    Diabetes mellitus without complication (HCC)    Dyspnea    GERD (gastroesophageal reflux disease)    Heart murmur    never given him any problems   History of blood transfusion    History of elevated glucose    per pt- pre diabetic   History of kidney stones    Hyperlipidemia    Hypertension    Pneumonia    Psoriasis    Past Surgical History:  Procedure Laterality Date   BACK SURGERY  1995   lumbar   COLONOSCOPY     CYSTOSCOPY WITH RETROGRADE PYELOGRAM, URETEROSCOPY AND STENT PLACEMENT Left 08/14/2023   Procedure: CYSTOSCOPY WITH LEFT RETROGRADE PYELOGRAM, URETEROSCOPY HOLMIUM LASER AND STENT PLACEMENT;  Surgeon: Crista Elliot, MD;  [facility];  Service: Urology;  Laterality: Left;  60 MINS FOR CASE   DENTAL SURGERY     pins placed upper front tooth   EXTRACORPOREAL SHOCK WAVE LITHOTRIPSY Left 06/29/2023   Procedure: LEFT EXTRACORPOREAL SHOCK WAVE LITHOTRIPSY (ESWL);  Surgeon: Crista Elliot, MD;  Location: [facility];  Service: Urology;  Laterality: Left;   LUMBAR LAMINECTOMY/DECOMPRESSION MICRODISCECTOMY N/A 11/07/2020   Procedure: Lumbar 4-5 Laminectomy/Foraminotomy;  Surgeon: Coletta Memos, MD;  [facility];  Service: Neurosurgery;  Laterality: N/A;  3C/RM 21   ORIF HUMERUS FRACTURE Left 07/11/2023   Procedure: OPEN REDUCTION INTERNAL FIXATION (ORIF) HUMERAL SHAFT  FRACTURE;  Surgeon: Beverely Low, MD;  [facility];  Service: Orthopedics;  Laterality: Left;   REVERSE SHOULDER ARTHROPLASTY Left 03/17/2014   Procedure: LEFT REVERSE SHOULDER ARTHROPLASTY;  Surgeon: Verlee Rossetti, MD;  Location: [facility];  Service: Orthopedics;  Laterality: Left;   SHOULDER OPEN ROTATOR CUFF REPAIR  10/07/2011   Procedure: ROTATOR CUFF REPAIR SHOULDER OPEN;  Surgeon: Jacki Cones;  [facility];  Service: Orthopedics;  Laterality: Right;  Open Rotator Cuff Repair/Acrominectomy with Graft and Anchors   SHOULDER OPEN ROTATOR CUFF REPAIR Left 06/08/2013   Procedure: OPEN LEFT SHOULDER ROTATOR CUFF REPAIR , complex repair with 3 anchors and graft;  Surgeon: Jacki Cones, MD;  [facility];  Service: Orthopedics;  Laterality: Left;   WRIST ARTHROSCOPY Left 10/11/2013   Procedure: LEFT ARTHROSCOPY WRIST DEBRIDMENT/SHRINKAGE;  Surgeon: Nicki Reaper, MD;  Location: [facility];  Service: Orthopedics;  Laterality: Left;   WRIST SURGERY Right    repair fractured wrist   Patient Active Problem List   Diagnosis Date Noted   Fracture closed, humerus, shaft 07/11/2023   Rib fractures 06/16/2023   Spinal stenosis, lumbar region with neurogenic claudication 11/07/2020   Osteoarthritis of left shoulder due to rotator cuff injury 03/17/2014   Torn rotator cuff 10/08/2011    REFERRING PROVIDER: Standley Dakins PA-C

## 2023-10-02 ENCOUNTER — Ambulatory Visit: Payer: Medicare Other | Attending: Physician Assistant | Admitting: Physical Therapy

## 2023-10-02 DIAGNOSIS — M25612 Stiffness of left shoulder, not elsewhere classified: Secondary | ICD-10-CM | POA: Insufficient documentation

## 2023-10-02 DIAGNOSIS — M25512 Pain in left shoulder: Secondary | ICD-10-CM | POA: Insufficient documentation

## 2023-10-02 DIAGNOSIS — R600 Localized edema: Secondary | ICD-10-CM | POA: Insufficient documentation

## 2023-10-02 DIAGNOSIS — G8929 Other chronic pain: Secondary | ICD-10-CM | POA: Insufficient documentation
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
## 2023-10-06 ENCOUNTER — Ambulatory Visit: Payer: Medicare Other

## 2023-10-06 DIAGNOSIS — M25512 Pain in left shoulder: Secondary | ICD-10-CM | POA: Diagnosis not present

## 2023-10-06 DIAGNOSIS — G8929 Other chronic pain: Secondary | ICD-10-CM

## 2023-10-06 DIAGNOSIS — M25612 Stiffness of left shoulder, not elsewhere classified: Secondary | ICD-10-CM

## 2023-10-06 DIAGNOSIS — R600 Localized edema: Secondary | ICD-10-CM
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
## 2023-10-09 ENCOUNTER — Encounter: Payer: Medicare Other | Admitting: Physical Therapy

## 2023-10-13 ENCOUNTER — Ambulatory Visit: Payer: Medicare Other | Admitting: *Deleted

## 2023-10-13 ENCOUNTER — Encounter: Payer: Self-pay | Admitting: *Deleted

## 2023-10-13 DIAGNOSIS — R600 Localized edema: Secondary | ICD-10-CM

## 2023-10-13 DIAGNOSIS — G8929 Other chronic pain: Secondary | ICD-10-CM

## 2023-10-13 DIAGNOSIS — M25612 Stiffness of left shoulder, not elsewhere classified: Secondary | ICD-10-CM

## 2023-10-13 DIAGNOSIS — M25512 Pain in left shoulder: Secondary | ICD-10-CM | POA: Diagnosis not present
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
## 2023-10-16 ENCOUNTER — Ambulatory Visit: Payer: Medicare Other | Admitting: Physical Therapy

## 2023-10-16 DIAGNOSIS — M25512 Pain in left shoulder: Secondary | ICD-10-CM | POA: Diagnosis not present

## 2023-10-16 DIAGNOSIS — G8929 Other chronic pain: Secondary | ICD-10-CM

## 2023-10-16 DIAGNOSIS — M25612 Stiffness of left shoulder, not elsewhere classified: Secondary | ICD-10-CM

## 2023-10-16 DIAGNOSIS — R600 Localized edema: Secondary | ICD-10-CM
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
## 2023-12-24 DIAGNOSIS — R931 Abnormal findings on diagnostic imaging of heart and coronary circulation: Secondary | ICD-10-CM

## 2023-12-24 HISTORY — DX: Abnormal findings on diagnostic imaging of heart and coronary circulation: R93.1

## 2024-03-03 ENCOUNTER — Other Ambulatory Visit: Payer: Self-pay | Admitting: Neurosurgery

## 2024-03-15 NOTE — Pre-Procedure Instructions (Signed)
 Surgical Instructions   Your procedure is scheduled on Thursday, April 24th. Report to [facility] Main Entrance "A" at 10:30 A.M., then check in with the Admitting office. Any questions or running late day of surgery: call [phone]  Questions prior to your surgery date: call [phone], Monday-Friday, 8am-4pm. If you experience any cold or flu symptoms such as cough, fever, chills, shortness of breath, etc. between now and your scheduled surgery, please notify us at the above number.     Remember:  Do not eat after midnight the night before your surgery  You may drink clear liquids until 09:30 AM the morning of your surgery.   Clear liquids allowed are: Water, Non-Citrus Juices (without pulp), Carbonated Beverages, Clear Tea (no milk, honey, etc.), Black Coffee Only (NO MILK, CREAM OR POWDERED CREAMER of any kind), and Gatorade.    Take these medicines the morning of surgery with A SIP OF WATER  ezetimibe (ZETIA)  gabapentin (NEURONTIN)   May take these medicines IF NEEDED: HYDROcodone-acetaminophen (NORCO)  methocarbamol (ROBAXIN)   Follow your surgeon's instructions on when to stop Aspirin.  If no instructions were given by your surgeon then you will need to call the office to get those instructions.    One week prior to surgery, STOP taking any Aleve, Naproxen, Ibuprofen, Motrin, Advil, Goody's, BC's, all herbal medications, fish oil, and non-prescription vitamins.                     Do NOT Smoke (Tobacco/Vaping) for 24 hours prior to your procedure.  If you use a CPAP at night, you may bring your mask/headgear for your overnight stay.   You will be asked to remove any contacts, glasses, piercing's, hearing aid's, dentures/partials prior to surgery. Please bring cases for these items if needed.    Patients discharged the day of surgery will not be allowed to drive home, and someone needs to stay with them for 24 hours.  SURGICAL WAITING ROOM VISITATION Patients may  have no more than 2 support people in the waiting area - these visitors may rotate.   Pre-op nurse will coordinate an appropriate time for 1 ADULT support person, who may not rotate, to accompany patient in pre-op.  Children under the age of 64 must have an adult with them who is not the patient and must remain in the main waiting area with an adult.  If the patient needs to stay at the hospital during part of their recovery, the visitor guidelines for inpatient rooms apply.  Please refer to the [facility] website for the visitor guidelines for any additional information.   If you received a COVID test during your pre-op visit  it is requested that you wear a mask when out in public, stay away from anyone that may not be feeling well and notify your surgeon if you develop symptoms. If you have been in contact with anyone that has tested positive in the last 10 days please notify you surgeon.      Pre-operative 5 CHG Bathing Instructions   You can play a key role in reducing the risk of infection after surgery. Your skin needs to be as free of germs as possible. You can reduce the number of germs on your skin by washing with CHG (chlorhexidine gluconate) soap before surgery. CHG is an antiseptic soap that kills germs and continues to kill germs even after washing.   DO NOT use if you have an allergy to chlorhexidine/CHG or antibacterial soaps.  If your skin becomes reddened or irritated, stop using the CHG and notify one of our RNs at [phone].   Please shower with the CHG soap starting 4 days before surgery using the following schedule:     Please keep in mind the following:  DO NOT shave, including legs and underarms, starting the day of your first shower.   You may shave your face at any point before/day of surgery.  Place clean sheets on your bed the day you start using CHG soap. Use a clean washcloth (not used since being washed) for each shower. DO NOT sleep with pets once you  start using the CHG.   CHG Shower Instructions:  Wash your face and private area with normal soap. If you choose to wash your hair, wash first with your normal shampoo.  After you use shampoo/soap, rinse your hair and body thoroughly to remove shampoo/soap residue.  Turn the water OFF and apply about 3 tablespoons (45 ml) of CHG soap to a CLEAN washcloth.  Apply CHG soap ONLY FROM YOUR NECK DOWN TO YOUR TOES (washing for 3-5 minutes)  DO NOT use CHG soap on face, private areas, open wounds, or sores.  Pay special attention to the area where your surgery is being performed.  If you are having back surgery, having someone wash your back for you may be helpful. Wait 2 minutes after CHG soap is applied, then you may rinse off the CHG soap.  Pat dry with a clean towel  Put on clean clothes/pajamas   If you choose to wear lotion, please use ONLY the CHG-compatible lotions that are listed below.  Additional instructions for the day of surgery: DO NOT APPLY any lotions, deodorants, cologne, or perfumes.   Do not bring valuables to the hospital. [facility] is not responsible for any belongings/valuables. Do not wear nail polish, gel polish, artificial nails, or any other type of covering on natural nails (fingers and toes) Do not wear jewelry or makeup Put on clean/comfortable clothes.  Please brush your teeth.  Ask your nurse before applying any prescription medications to the skin.     CHG Compatible Lotions   Aveeno Moisturizing lotion  Cetaphil Moisturizing Cream  Cetaphil Moisturizing Lotion  Clairol Herbal Essence Moisturizing Lotion, Dry Skin  Clairol Herbal Essence Moisturizing Lotion, Extra Dry Skin  Clairol Herbal Essence Moisturizing Lotion, Normal Skin  Curel Age Defying Therapeutic Moisturizing Lotion with Alpha Hydroxy  Curel Extreme Care Body Lotion  Curel Soothing Hands Moisturizing Hand Lotion  Curel Therapeutic Moisturizing Cream, Fragrance-Free  Curel Therapeutic  Moisturizing Lotion, Fragrance-Free  Curel Therapeutic Moisturizing Lotion, Original Formula  Eucerin Daily Replenishing Lotion  Eucerin Dry Skin Therapy Plus Alpha Hydroxy Crme  Eucerin Dry Skin Therapy Plus Alpha Hydroxy Lotion  Eucerin Original Crme  Eucerin Original Lotion  Eucerin Plus Crme Eucerin Plus Lotion  Eucerin TriLipid Replenishing Lotion  Keri Anti-Bacterial Hand Lotion  Keri Deep Conditioning Original Lotion Dry Skin Formula Softly Scented  Keri Deep Conditioning Original Lotion, Fragrance Free Sensitive Skin Formula  Keri Lotion Fast Absorbing Fragrance Free Sensitive Skin Formula  Keri Lotion Fast Absorbing Softly Scented Dry Skin Formula  Keri Original Lotion  Keri Skin Renewal Lotion Keri Silky Smooth Lotion  Keri Silky Smooth Sensitive Skin Lotion  Nivea Body Creamy Conditioning Oil  Nivea Body Extra Enriched Teacher, adult education Moisturizing Lotion Nivea Crme  Nivea Skin Firming Lotion  NutraDerm 30 Skin Lotion  NutraDerm Skin Lotion  NutraDerm Therapeutic Skin Cream  NutraDerm Therapeutic Skin Lotion  ProShield Protective Hand Cream  Provon moisturizing lotion  Please read over the following fact sheets that you were given.

## 2024-03-16 ENCOUNTER — Encounter (HOSPITAL_COMMUNITY)
Admission: RE | Admit: 2024-03-16 | Discharge: 2024-03-16 | Disposition: A | Discharge status: Home / Self Care | Source: Ambulatory Visit | Attending: Neurosurgery | Admitting: Neurosurgery

## 2024-03-16 ENCOUNTER — Encounter (HOSPITAL_COMMUNITY): Payer: Self-pay

## 2024-03-16 ENCOUNTER — Other Ambulatory Visit: Payer: Self-pay

## 2024-03-16 VITALS — BP 160/63 | HR 69 | Temp 97.9°F | Resp 18 | Ht 72.0 in | Wt 206.4 lb

## 2024-03-16 DIAGNOSIS — E785 Hyperlipidemia, unspecified: Secondary | ICD-10-CM | POA: Diagnosis not present

## 2024-03-16 DIAGNOSIS — K219 Gastro-esophageal reflux disease without esophagitis: Secondary | ICD-10-CM | POA: Insufficient documentation

## 2024-03-16 DIAGNOSIS — M1990 Unspecified osteoarthritis, unspecified site: Secondary | ICD-10-CM | POA: Diagnosis not present

## 2024-03-16 DIAGNOSIS — L409 Psoriasis, unspecified: Secondary | ICD-10-CM | POA: Diagnosis not present

## 2024-03-16 DIAGNOSIS — E119 Type 2 diabetes mellitus without complications: Secondary | ICD-10-CM | POA: Diagnosis not present

## 2024-03-16 DIAGNOSIS — M488X6 Other specified spondylopathies, lumbar region: Secondary | ICD-10-CM | POA: Diagnosis not present

## 2024-03-16 DIAGNOSIS — Z79899 Other long term (current) drug therapy: Secondary | ICD-10-CM | POA: Insufficient documentation

## 2024-03-16 DIAGNOSIS — Z981 Arthrodesis status: Secondary | ICD-10-CM | POA: Diagnosis not present

## 2024-03-16 DIAGNOSIS — Z7982 Long term (current) use of aspirin: Secondary | ICD-10-CM | POA: Diagnosis not present

## 2024-03-16 DIAGNOSIS — Z01818 Encounter for other preprocedural examination: Secondary | ICD-10-CM

## 2024-03-16 DIAGNOSIS — I358 Other nonrheumatic aortic valve disorders: Secondary | ICD-10-CM | POA: Insufficient documentation

## 2024-03-16 DIAGNOSIS — Z01812 Encounter for preprocedural laboratory examination: Secondary | ICD-10-CM | POA: Insufficient documentation

## 2024-03-16 DIAGNOSIS — R0600 Dyspnea, unspecified: Secondary | ICD-10-CM | POA: Insufficient documentation

## 2024-03-16 DIAGNOSIS — Z87442 Personal history of urinary calculi: Secondary | ICD-10-CM | POA: Diagnosis not present

## 2024-03-16 DIAGNOSIS — I10 Essential (primary) hypertension: Secondary | ICD-10-CM | POA: Insufficient documentation

## 2024-03-16 DIAGNOSIS — Z960 Presence of urogenital implants: Secondary | ICD-10-CM | POA: Insufficient documentation

## 2024-03-16 LAB — CBC
HCT: 39.1 % (ref 39.0–52.0)
Hemoglobin: 12.7 g/dL — ABNORMAL LOW (ref 13.0–17.0)
MCH: 30.9 pg (ref 26.0–34.0)
MCHC: 32.5 g/dL (ref 30.0–36.0)
MCV: 95.1 fL (ref 80.0–100.0)
Platelets: 272 10*3/uL (ref 150–400)
RBC: 4.11 MIL/uL — ABNORMAL LOW (ref 4.22–5.81)
RDW: 13.2 % (ref 11.5–15.5)
WBC: 6.1 10*3/uL (ref 4.0–10.5)
nRBC: 0 % (ref 0.0–0.2)

## 2024-03-16 LAB — BASIC METABOLIC PANEL WITH GFR
Anion gap: 12 (ref 5–15)
BUN: 14 mg/dL (ref 8–23)
CO2: 24 mmol/L (ref 22–32)
Calcium: 9.8 mg/dL (ref 8.9–10.3)
Chloride: 105 mmol/L (ref 98–111)
Creatinine, Ser: 1.18 mg/dL (ref 0.61–1.24)
GFR, Estimated: >60 mL/min (ref >60)
Glucose, Bld: 126 mg/dL — ABNORMAL HIGH (ref 70–99)
Potassium: 4.5 mmol/L (ref 3.5–5.1)
Sodium: 141 mmol/L (ref 135–145)

## 2024-03-16 LAB — TYPE AND SCREEN
ABO/RH(D): A POS
Antibody Screen: NEGATIVE

## 2024-03-16 LAB — SURGICAL PCR SCREEN
MRSA, PCR: NEGATIVE
Staphylococcus aureus: NEGATIVE

## 2024-03-16 NOTE — Progress Notes (Signed)
 PCP - Porfirio Bristol Beam,MD Cardiologist - Madan Badal,MD [facility] Johna Myers  PPM/ICD - denies Device Orders -  Rep Notified -   Chest x-ray - na EKG - 09/24/23-tracing requested Stress Test - requested 09/24/23 ECHO - 10/07/18 Cardiac Cath - denies  Sleep Study - denies CPAP -   Fasting Blood Sugar - 110-120's Checks Blood Sugar every morning  Last dose of GLP1 agonist-  NA GLP1 instructions:   Blood Thinner Instructions:na Aspirin Instructions:pt states he will call surgeon's office for instructions.  ERAS Protcol -clears until 0930 PRE-SURGERY Ensure or G2- no  COVID TEST- na   Anesthesia review:   Patient denies shortness of breath, fever, cough and chest pain at PAT appointment   All instructions explained to the patient, with a verbal understanding of the material. Patient agrees to go over the instructions while at home for a better understanding. Patient also instructed towear a mask when out in public prior to surgery. The opportunity to ask questions was provided.

## 2024-03-17 ENCOUNTER — Encounter (HOSPITAL_COMMUNITY): Payer: Self-pay
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
## 2024-03-17 NOTE — Anesthesia Preprocedure Evaluation (Addendum)
 Anesthesia Evaluation  Patient identified by MRN, date of birth, ID band Patient awake    Reviewed: Allergy & Precautions, NPO status , Patient's Chart, lab work & pertinent test results  History of Anesthesia Complications Negative for: history of anesthetic complications  Airway Mallampati: III  TM Distance: <3 FB Neck ROM: Full    Dental  (+) Teeth Intact, Dental Advisory Given,    Pulmonary neg shortness of breath, neg sleep apnea, neg COPD, neg recent URI   breath sounds clear to auscultation       Cardiovascular hypertension, Pt. on medications (-) angina + CAD  (-) Past MI and (-) CHF (-) dysrhythmias + Valvular Problems/Murmurs AS  Rhythm:Regular  Echocardiogram 09/25/2023 Left Ventricle: Left ventricle size is normal. Left Ventricle: There is mild concentric hypertrophy. Left Ventricle: Doppler parameters consistent with mild diastolic dysfunction and low to normal LA pressure. Left Ventricle: Systolic function is normal. EF: 55-60%. Right Ventricle: Right ventricle size is normal. Right Ventricle: Systolic function is normal. Aortic Valve: There is mild stenosis, with peak and mean gradients of 25.000 and 12.000 mmHg.    Nuclear stress test on 01/28/2024 The patient was initially injected with 12.7 millicuries of technetium Cardiolite. SPECT imaging of the left ventricle was performed at rest. Lexiscan infusion was performed as per protocol 38.4 millicuries of technetium Cardiolite were injected intravenously at peak stress. SPECT imaging of the left ventricle was then repeated. Resting ECG revealed normal sinus rhythm, first degree AV block and . There were no ischemic ECG changes with Lexiscan infusion     Neuro/Psych neg Seizures  Neuromuscular disease  negative psych ROS   GI/Hepatic Neg liver ROS,GERD  ,,  Endo/Other  diabetes    Renal/GU Lab Results      Component                Value               Date                       NA                       141                 03/16/2024                K                        4.5                 03/16/2024                CO2                      24                  03/16/2024                GLUCOSE                  126 (H)             03/16/2024                BUN                      14  03/16/2024                CREATININE               1.18                03/16/2024                CALCIUM                  9.8                 03/16/2024                GFRNONAA                 >60                 03/16/2024                Musculoskeletal  (+) Arthritis ,    Abdominal   Peds  Hematology negative hematology ROS (+) Lab Results      Component                Value               Date                      WBC                      6.1                 03/16/2024                HGB                      12.7 (L)            03/16/2024                HCT                      39.1                03/16/2024                MCV                      95.1                03/16/2024                PLT                      272                 03/16/2024              Anesthesia Other Findings   Reproductive/Obstetrics                              Anesthesia Physical Anesthesia Plan  ASA: 2  Anesthesia Plan: General   Post-op Pain Management: Ofirmev  IV (intra-op)*   Induction: Intravenous  PONV Risk Score and Plan: 3 and Ondansetron  and Dexamethasone   Airway Management Planned: Oral ETT  Additional Equipment: None  Intra-op Plan:   Post-operative Plan: Extubation in OR  Informed  Consent: I have reviewed the patients History and Physical, chart, labs and discussed the procedure including the risks, benefits and alternatives for the proposed anesthesia with the patient or authorized representative who has indicated his/her understanding and acceptance.     Dental advisory given  Plan Discussed with:  CRNA  Anesthesia Plan Comments: (PAT note written 03/17/2024 by Allison Zelenak, PA-C.  Nuclear stress test 01/28/24 ([facility]): Impression:   1. No chest pain with stress.  2. No ST changes to suggest ischemia.  3. Normal LV systolic function and wall motion.  4. No ischemia by perfusion imaging.    CT Cardiac Calcium Scoring 12/24/23 ([facility]): IMPRESSION:  1. The Agatston CAC score is  2393. This places the patient between the 90th and 100th percentile for age and gender based on the [facility] data base.  2. CAC is present in all of the coronary arteries.    14 day Mobile Telemetry 10/13/23 ([facility]): Impression: The patient was monitored for a total of 13d 23h, underlying rhythm is Sinus. QRS morphology changes noted.  The minimum heart rate was 36 bpm; the maximum 134 bpm; the average 62 bpm.  No A-fib/flutter.  No evidence of high-grade AV block or pathological pauses  25 supraventricular episodes were found. Longest SVT Episode 10 beats, Fastest SVT 147 bpm There were a total of 46 PVCs with 2 morphologies and 1 couplets. Overall PVC Burden at < 0.01 % There were a total of 0 Other Beats. There were 0 total number of paced beats.  There were a total of 118526 PSVCs with 4090 couplets. Overall PSVC Burden at 9.46 %  There is a total of 0 patient events    Echo 09/25/23 ([facility]): Impression: Left Ventricle: Left ventricle size is normal.    Left Ventricle: There is mild concentric hypertrophy.    Left Ventricle: Doppler parameters consistent with mild diastolic  dysfunction and low to normal LA pressure.    Left Ventricle: Systolic function is normal. EF: 55-60%.    Right Ventricle: Right ventricle size is normal.    Right Ventricle: Systolic function is normal.    Aortic Valve: There is mild stenosis, with peak and mean gradients of  25.000 and 12.000 mmHg.  )        Anesthesia Quick Evaluation

## 2024-03-23 NOTE — Progress Notes (Signed)
 Patient was called to be informed that the surgery time for tomorrow was changed. Patient wasn't available and this writer talked with the patient's wife. Wife was informed that the patient must be at the hospital tomorrow at 11:00 o'clock and he must stop drinking clear liquids at 10:00 o'clock. Wife verbalized understanding.

## 2024-03-24 ENCOUNTER — Other Ambulatory Visit: Payer: Self-pay

## 2024-03-24 ENCOUNTER — Ambulatory Visit (HOSPITAL_COMMUNITY): Admitting: Vascular Surgery

## 2024-03-24 ENCOUNTER — Ambulatory Visit (HOSPITAL_COMMUNITY)

## 2024-03-24 ENCOUNTER — Ambulatory Visit (HOSPITAL_BASED_OUTPATIENT_CLINIC_OR_DEPARTMENT_OTHER): Admitting: Anesthesiology

## 2024-03-24 ENCOUNTER — Encounter (HOSPITAL_COMMUNITY)
Admission: RE | Disposition: A | Discharge status: Home / Self Care | Payer: Self-pay | Source: Home / Self Care | Attending: Neurosurgery

## 2024-03-24 ENCOUNTER — Ambulatory Visit (HOSPITAL_COMMUNITY)
Admission: RE | Admit: 2024-03-24 | Discharge: 2024-03-25 | Disposition: A | Discharge status: Home / Self Care | Attending: Neurosurgery | Admitting: Neurosurgery

## 2024-03-24 DIAGNOSIS — M47816 Spondylosis without myelopathy or radiculopathy, lumbar region: Secondary | ICD-10-CM | POA: Diagnosis not present

## 2024-03-24 DIAGNOSIS — M48062 Spinal stenosis, lumbar region with neurogenic claudication: Secondary | ICD-10-CM | POA: Insufficient documentation

## 2024-03-24 DIAGNOSIS — M51369 Other intervertebral disc degeneration, lumbar region without mention of lumbar back pain or lower extremity pain: Secondary | ICD-10-CM

## 2024-03-24 DIAGNOSIS — I2510 Atherosclerotic heart disease of native coronary artery without angina pectoris: Secondary | ICD-10-CM | POA: Diagnosis not present

## 2024-03-24 DIAGNOSIS — Z01818 Encounter for other preprocedural examination: Secondary | ICD-10-CM

## 2024-03-24 DIAGNOSIS — E119 Type 2 diabetes mellitus without complications: Secondary | ICD-10-CM | POA: Insufficient documentation

## 2024-03-24 DIAGNOSIS — Z419 Encounter for procedure for purposes other than remedying health state, unspecified: Secondary | ICD-10-CM

## 2024-03-24 DIAGNOSIS — Z981 Arthrodesis status: Secondary | ICD-10-CM

## 2024-03-24 DIAGNOSIS — M4316 Spondylolisthesis, lumbar region: Secondary | ICD-10-CM | POA: Insufficient documentation

## 2024-03-24 DIAGNOSIS — I10 Essential (primary) hypertension: Secondary | ICD-10-CM | POA: Diagnosis not present

## 2024-03-24 DIAGNOSIS — Z79899 Other long term (current) drug therapy: Secondary | ICD-10-CM | POA: Diagnosis not present

## 2024-03-24 DIAGNOSIS — M48061 Spinal stenosis, lumbar region without neurogenic claudication: Secondary | ICD-10-CM | POA: Diagnosis present

## 2024-03-24 HISTORY — PX: POSTERIOR LUMBAR FUSION: SHX6036

## 2024-03-24 LAB — CBC
HCT: 34.4 % — ABNORMAL LOW (ref 39.0–52.0)
Hemoglobin: 11.5 g/dL — ABNORMAL LOW (ref 13.0–17.0)
MCH: 31.7 pg (ref 26.0–34.0)
MCHC: 33.4 g/dL (ref 30.0–36.0)
MCV: 94.8 fL (ref 80.0–100.0)
Platelets: 273 10*3/uL (ref 150–400)
RBC: 3.63 MIL/uL — ABNORMAL LOW (ref 4.22–5.81)
RDW: 13.4 % (ref 11.5–15.5)
WBC: 12.1 10*3/uL — ABNORMAL HIGH (ref 4.0–10.5)
nRBC: 0 % (ref 0.0–0.2)

## 2024-03-24 LAB — GLUCOSE, CAPILLARY
Glucose-Capillary: 115 mg/dL — ABNORMAL HIGH (ref 70–99)
Glucose-Capillary: 108 mg/dL — ABNORMAL HIGH (ref 70–99)
Glucose-Capillary: 191 mg/dL — ABNORMAL HIGH (ref 70–99)

## 2024-03-24 LAB — CREATININE, SERUM
Creatinine, Ser: 1.26 mg/dL — ABNORMAL HIGH (ref 0.61–1.24)
GFR, Estimated: >60 mL/min (ref >60)

## 2024-03-24 SURGERY — POSTERIOR LUMBAR FUSION 1 LEVEL
Anesthesia: General | Site: Spine Lumbar

## 2024-03-24 MED ORDER — LIDOCAINE 2% (20 MG/ML) 5 ML SYRINGE
INTRAMUSCULAR | Status: DC | PRN
  Administered 2024-03-24: 60 mg via INTRAVENOUS

## 2024-03-24 MED ORDER — OXYCODONE HCL 5 MG PO TABS
10.0000 mg | ORAL_TABLET | ORAL | Status: DC | PRN
  Administered 2024-03-24 – 2024-03-25 (×4): 10 mg via ORAL
  Filled 2024-03-24 (×5): qty 2

## 2024-03-24 MED ORDER — OXYCODONE HCL 5 MG PO TABS: 5.0000 mg | ORAL_TABLET | ORAL | Status: DC | PRN

## 2024-03-24 MED ORDER — BUPIVACAINE HCL (PF) 0.5 % IJ SOLN
INTRAMUSCULAR | Status: DC | PRN
  Administered 2024-03-24: 30 mL

## 2024-03-24 MED ORDER — DEXAMETHASONE SODIUM PHOSPHATE 10 MG/ML IJ SOLN
INTRAMUSCULAR | Status: DC | PRN
  Administered 2024-03-24: 8 mg via INTRAVENOUS

## 2024-03-24 MED ORDER — VALSARTAN-HYDROCHLOROTHIAZIDE 320-25 MG PO TABS: 1.0000 | ORAL_TABLET | Freq: Every day | ORAL | Status: DC

## 2024-03-24 MED ORDER — VITAMIN B-12 1000 MCG PO TABS
500.0000 ug | ORAL_TABLET | Freq: Every day | ORAL | Status: DC
  Administered 2024-03-24 – 2024-03-25 (×2): 500 ug via ORAL
  Filled 2024-03-24 (×2): qty 1

## 2024-03-24 MED ORDER — SUGAMMADEX SODIUM 200 MG/2ML IV SOLN
INTRAVENOUS | Status: DC | PRN
  Administered 2024-03-24: 200 mg via INTRAVENOUS

## 2024-03-24 MED ORDER — METOPROLOL TARTRATE 5 MG/5ML IV SOLN
INTRAVENOUS | Status: DC | PRN
  Administered 2024-03-24 (×2): 2 mg via INTRAVENOUS

## 2024-03-24 MED ORDER — LIDOCAINE-EPINEPHRINE 0.5 %-1:200000 IJ SOLN
INTRAMUSCULAR | Status: AC
  Filled 2024-03-24: qty 50

## 2024-03-24 MED ORDER — INSULIN ASPART 100 UNIT/ML IJ SOLN: 0.0000 [IU] | INTRAMUSCULAR | Status: DC | PRN

## 2024-03-24 MED ORDER — OXYCODONE HCL 5 MG/5ML PO SOLN: 5.0000 mg | Freq: Once | ORAL | Status: DC | PRN

## 2024-03-24 MED ORDER — ACETAMINOPHEN 10 MG/ML IV SOLN: 1000.0000 mg | Freq: Once | INTRAVENOUS | Status: DC | PRN

## 2024-03-24 MED ORDER — PROPOFOL 10 MG/ML IV BOLUS
INTRAVENOUS | Status: DC | PRN
  Administered 2024-03-24: 100 mg via INTRAVENOUS

## 2024-03-24 MED ORDER — AMLODIPINE BESYLATE 10 MG PO TABS
10.0000 mg | ORAL_TABLET | Freq: Every day | ORAL | Status: DC
Start: 2024-03-25 — End: 2024-03-25

## 2024-03-24 MED ORDER — HEPARIN SODIUM (PORCINE) 5000 UNIT/ML IJ SOLN
5000.0000 [IU] | Freq: Three times a day (TID) | INTRAMUSCULAR | Status: DC
  Filled 2024-03-24: qty 1

## 2024-03-24 MED ORDER — MENTHOL 3 MG MT LOZG: 1.0000 | LOZENGE | OROMUCOSAL | Status: DC | PRN

## 2024-03-24 MED ORDER — FENOFIBRATE 160 MG PO TABS
160.0000 mg | ORAL_TABLET | Freq: Every day | ORAL | Status: DC
  Administered 2024-03-24: 160 mg via ORAL
  Filled 2024-03-24 (×2): qty 1

## 2024-03-24 MED ORDER — ASPIRIN 81 MG PO TBEC
81.0000 mg | DELAYED_RELEASE_TABLET | Freq: Every day | ORAL | Status: DC
  Administered 2024-03-24 – 2024-03-25 (×2): 81 mg via ORAL
  Filled 2024-03-24 (×2): qty 1

## 2024-03-24 MED ORDER — SODIUM CHLORIDE 0.9% FLUSH
3.0000 mL | Freq: Two times a day (BID) | INTRAVENOUS | Status: DC
  Administered 2024-03-24: 3 mL via INTRAVENOUS

## 2024-03-24 MED ORDER — EPHEDRINE SULFATE-NACL 50-0.9 MG/10ML-% IV SOSY
PREFILLED_SYRINGE | INTRAVENOUS | Status: DC | PRN
  Administered 2024-03-24 (×4): 5 mg via INTRAVENOUS

## 2024-03-24 MED ORDER — POTASSIUM CHLORIDE IN NACL 20-0.9 MEQ/L-% IV SOLN
INTRAVENOUS | Status: DC
  Filled 2024-03-24: qty 1000

## 2024-03-24 MED ORDER — PRAVASTATIN SODIUM 40 MG PO TABS
80.0000 mg | ORAL_TABLET | Freq: Every day | ORAL | Status: DC
  Administered 2024-03-24: 80 mg via ORAL
  Filled 2024-03-24: qty 8
  Filled 2024-03-24 (×2): qty 2

## 2024-03-24 MED ORDER — ZOLPIDEM TARTRATE 5 MG PO TABS: 5.0000 mg | ORAL_TABLET | Freq: Every evening | ORAL | Status: DC | PRN

## 2024-03-24 MED ORDER — THROMBIN 20000 UNITS EX SOLR
CUTANEOUS | Status: AC
  Filled 2024-03-24: qty 20000

## 2024-03-24 MED ORDER — THROMBIN 20000 UNITS EX SOLR
CUTANEOUS | Status: DC | PRN
  Administered 2024-03-24: 20 mL via TOPICAL

## 2024-03-24 MED ORDER — OXYCODONE HCL ER 10 MG PO T12A
10.0000 mg | EXTENDED_RELEASE_TABLET | Freq: Two times a day (BID) | ORAL | Status: DC
  Administered 2024-03-24 – 2024-03-25 (×2): 10 mg via ORAL
  Filled 2024-03-24 (×2): qty 1

## 2024-03-24 MED ORDER — DIAZEPAM 5 MG PO TABS
5.0000 mg | ORAL_TABLET | Freq: Four times a day (QID) | ORAL | Status: DC | PRN
  Administered 2024-03-24: 5 mg via ORAL
  Filled 2024-03-24: qty 1

## 2024-03-24 MED ORDER — PHENYLEPHRINE 80 MCG/ML (10ML) SYRINGE FOR IV PUSH (FOR BLOOD PRESSURE SUPPORT)
PREFILLED_SYRINGE | INTRAVENOUS | Status: DC | PRN
  Administered 2024-03-24: 80 ug via INTRAVENOUS

## 2024-03-24 MED ORDER — ROCURONIUM BROMIDE 10 MG/ML (PF) SYRINGE
PREFILLED_SYRINGE | INTRAVENOUS | Status: AC
  Filled 2024-03-24: qty 10

## 2024-03-24 MED ORDER — GABAPENTIN 300 MG PO CAPS
300.0000 mg | ORAL_CAPSULE | Freq: Every day | ORAL | Status: DC
  Administered 2024-03-24 – 2024-03-25 (×2): 300 mg via ORAL
  Filled 2024-03-24 (×2): qty 1

## 2024-03-24 MED ORDER — OXYCODONE HCL 5 MG PO TABS
ORAL_TABLET | ORAL | Status: AC
  Filled 2024-03-24: qty 2

## 2024-03-24 MED ORDER — CEFAZOLIN SODIUM-DEXTROSE 2-4 GM/100ML-% IV SOLN
2.0000 g | INTRAVENOUS | Status: AC
  Administered 2024-03-24: 2 g via INTRAVENOUS
  Filled 2024-03-24: qty 100

## 2024-03-24 MED ORDER — ACETAMINOPHEN 325 MG PO TABS: 650.0000 mg | ORAL_TABLET | ORAL | Status: DC | PRN

## 2024-03-24 MED ORDER — ACETAMINOPHEN 650 MG RE SUPP: 650.0000 mg | RECTAL | Status: DC | PRN

## 2024-03-24 MED ORDER — OXYCODONE HCL 5 MG PO TABS: 5.0000 mg | ORAL_TABLET | Freq: Once | ORAL | Status: DC | PRN

## 2024-03-24 MED ORDER — FENTANYL CITRATE (PF) 250 MCG/5ML IJ SOLN
INTRAMUSCULAR | Status: AC
  Filled 2024-03-24: qty 5

## 2024-03-24 MED ORDER — FENTANYL CITRATE (PF) 100 MCG/2ML IJ SOLN: 25.0000 ug | INTRAMUSCULAR | Status: DC | PRN

## 2024-03-24 MED ORDER — 0.9 % SODIUM CHLORIDE (POUR BTL) OPTIME
TOPICAL | Status: DC | PRN
  Administered 2024-03-24: 1000 mL

## 2024-03-24 MED ORDER — ONDANSETRON HCL 4 MG PO TABS: 4.0000 mg | ORAL_TABLET | Freq: Four times a day (QID) | ORAL | Status: DC | PRN

## 2024-03-24 MED ORDER — EZETIMIBE 10 MG PO TABS
10.0000 mg | ORAL_TABLET | Freq: Every day | ORAL | Status: DC
  Administered 2024-03-24 – 2024-03-25 (×2): 10 mg via ORAL
  Filled 2024-03-24 (×2): qty 1

## 2024-03-24 MED ORDER — SODIUM CHLORIDE 0.9 % IV SOLN: 250.0000 mL | INTRAVENOUS | Status: DC

## 2024-03-24 MED ORDER — LACTATED RINGERS IV SOLN: INTRAVENOUS | Status: DC

## 2024-03-24 MED ORDER — HYDROCHLOROTHIAZIDE 25 MG PO TABS
25.0000 mg | ORAL_TABLET | Freq: Every day | ORAL | Status: DC
  Administered 2024-03-24 – 2024-03-25 (×2): 25 mg via ORAL
  Filled 2024-03-24 (×2): qty 1

## 2024-03-24 MED ORDER — ACETAMINOPHEN 500 MG PO TABS
1000.0000 mg | ORAL_TABLET | Freq: Four times a day (QID) | ORAL | Status: DC
  Administered 2024-03-24 – 2024-03-25 (×2): 1000 mg via ORAL
  Filled 2024-03-24 (×2): qty 2

## 2024-03-24 MED ORDER — ORAL CARE MOUTH RINSE: 15.0000 mL | Freq: Once | OROMUCOSAL | Status: AC

## 2024-03-24 MED ORDER — CO Q 10 10 MG PO CAPS: 200.0000 mg | ORAL_CAPSULE | Freq: Every day | ORAL | Status: DC

## 2024-03-24 MED ORDER — IRBESARTAN 300 MG PO TABS
300.0000 mg | ORAL_TABLET | Freq: Every day | ORAL | Status: DC
  Administered 2024-03-24 – 2024-03-25 (×2): 300 mg via ORAL
  Filled 2024-03-24 (×2): qty 1

## 2024-03-24 MED ORDER — ROCURONIUM BROMIDE 10 MG/ML (PF) SYRINGE
PREFILLED_SYRINGE | INTRAVENOUS | Status: DC | PRN
  Administered 2024-03-24 (×2): 10 mg via INTRAVENOUS
  Administered 2024-03-24: 70 mg via INTRAVENOUS
  Administered 2024-03-24: 20 mg via INTRAVENOUS

## 2024-03-24 MED ORDER — CHLORHEXIDINE GLUCONATE CLOTH 2 % EX PADS: 6.0000 | MEDICATED_PAD | Freq: Once | CUTANEOUS | Status: DC

## 2024-03-24 MED ORDER — ONDANSETRON HCL 4 MG/2ML IJ SOLN: 4.0000 mg | Freq: Four times a day (QID) | INTRAMUSCULAR | Status: DC | PRN

## 2024-03-24 MED ORDER — ACETAMINOPHEN 10 MG/ML IV SOLN
INTRAVENOUS | Status: DC | PRN
  Administered 2024-03-24: 1000 mg via INTRAVENOUS

## 2024-03-24 MED ORDER — FENTANYL CITRATE (PF) 250 MCG/5ML IJ SOLN
INTRAMUSCULAR | Status: DC | PRN
  Administered 2024-03-24 (×3): 50 ug via INTRAVENOUS
  Administered 2024-03-24: 100 ug via INTRAVENOUS

## 2024-03-24 MED ORDER — CHLORHEXIDINE GLUCONATE CLOTH 2 % EX PADS
6.0000 | MEDICATED_PAD | Freq: Once | CUTANEOUS | Status: AC
Start: 2024-03-24 — End: 2024-03-24
  Administered 2024-03-24: 6 via TOPICAL

## 2024-03-24 MED ORDER — CHLORHEXIDINE GLUCONATE 0.12 % MT SOLN
15.0000 mL | Freq: Once | OROMUCOSAL | Status: AC
  Administered 2024-03-24: 15 mL via OROMUCOSAL
  Filled 2024-03-24: qty 15

## 2024-03-24 MED ORDER — BUPIVACAINE HCL (PF) 0.5 % IJ SOLN
INTRAMUSCULAR | Status: AC
  Filled 2024-03-24: qty 30

## 2024-03-24 MED ORDER — VITAMIN D 25 MCG (1000 UNIT) PO TABS
1000.0000 [IU] | ORAL_TABLET | Freq: Every day | ORAL | Status: DC
  Administered 2024-03-24 – 2024-03-25 (×2): 1000 [IU] via ORAL
  Filled 2024-03-24 (×2): qty 1

## 2024-03-24 MED ORDER — PHENOL 1.4 % MT LIQD: 1.0000 | OROMUCOSAL | Status: DC | PRN

## 2024-03-24 MED ORDER — ONDANSETRON HCL 4 MG/2ML IJ SOLN
INTRAMUSCULAR | Status: DC | PRN
  Administered 2024-03-24: 4 mg via INTRAVENOUS

## 2024-03-24 MED ORDER — SODIUM CHLORIDE 0.9% FLUSH: 3.0000 mL | INTRAVENOUS | Status: DC | PRN

## 2024-03-24 SURGICAL SUPPLY — 57 items
BAG COUNTER SPONGE SURGICOUNT (BAG) ×1 IMPLANT
BASKET BONE COLLECTION (BASKET) ×1 IMPLANT
BENZOIN TINCTURE PRP APPL 2/3 (GAUZE/BANDAGES/DRESSINGS) IMPLANT
BIT DRILL PLIF MAS DISP 5.5MM (DRILL) IMPLANT
BLADE BONE MILL MEDIUM (MISCELLANEOUS) ×1 IMPLANT
BLADE CLIPPER SURG (BLADE) IMPLANT
BUR MATCHSTICK NEURO 3.0 LAGG (BURR) ×1 IMPLANT
BUR PRECISION FLUTE 5.0 (BURR) ×1 IMPLANT
CANISTER SUCT 3000ML PPV (MISCELLANEOUS) ×1 IMPLANT
CAP RELINE MOD TULIP RMM (Cap) IMPLANT
CNTNR URN SCR LID CUP LEK RST (MISCELLANEOUS) ×1 IMPLANT
COVER BACK TABLE 60X90IN (DRAPES) ×1 IMPLANT
DERMABOND ADVANCED .7 DNX12 (GAUZE/BANDAGES/DRESSINGS) ×1 IMPLANT
DRAPE C-ARM 42X72 X-RAY (DRAPES) ×2 IMPLANT
DRAPE C-ARMOR (DRAPES) IMPLANT
DRAPE LAPAROTOMY 100X72X124 (DRAPES) ×1 IMPLANT
DRAPE SURG 17X23 STRL (DRAPES) ×1 IMPLANT
DRSG OPSITE POSTOP 4X8 (GAUZE/BANDAGES/DRESSINGS) IMPLANT
DURAPREP 26ML APPLICATOR (WOUND CARE) ×1 IMPLANT
ELECTRODE REM PT RTRN 9FT ADLT (ELECTROSURGICAL) ×1 IMPLANT
GAUZE 4X4 16PLY ~~LOC~~+RFID DBL (SPONGE) IMPLANT
GAUZE SPONGE 4X4 12PLY STRL (GAUZE/BANDAGES/DRESSINGS) IMPLANT
GLOVE EXAM NITRILE XL STR (GLOVE) IMPLANT
GLOVE SURG LTX SZ6.5 (GLOVE) ×2 IMPLANT
GOWN STRL REUS W/ TWL LRG LVL3 (GOWN DISPOSABLE) ×2 IMPLANT
GOWN STRL REUS W/ TWL XL LVL3 (GOWN DISPOSABLE) IMPLANT
GOWN STRL REUS W/TWL 2XL LVL3 (GOWN DISPOSABLE) IMPLANT
KIT BASIN OR (CUSTOM PROCEDURE TRAY) ×1 IMPLANT
KIT POSITION SURG JACKSON T1 (MISCELLANEOUS) ×1 IMPLANT
KIT TURNOVER KIT B (KITS) ×1 IMPLANT
MILL BONE PREP (MISCELLANEOUS) IMPLANT
NDL HYPO 25X1 1.5 SAFETY (NEEDLE) ×1 IMPLANT
NDL SPNL 18GX3.5 QUINCKE PK (NEEDLE) IMPLANT
NEEDLE HYPO 25X1 1.5 SAFETY (NEEDLE) ×1 IMPLANT
NEEDLE SPNL 18GX3.5 QUINCKE PK (NEEDLE) IMPLANT
NS IRRIG 1000ML POUR BTL (IV SOLUTION) ×1 IMPLANT
PACK LAMINECTOMY NEURO (CUSTOM PROCEDURE TRAY) ×1 IMPLANT
PAD ARMBOARD POSITIONER FOAM (MISCELLANEOUS) ×2 IMPLANT
PATTIES SURGICAL .5 X.5 (GAUZE/BANDAGES/DRESSINGS) ×1 IMPLANT
PATTIES SURGICAL 1X1 (DISPOSABLE) ×1 IMPLANT
ROD LORD RELINE O 5X80 (Rod) IMPLANT
SCREW LOCK RSS 4.5/5.0MM (Screw) IMPLANT
SCREW SHANK RELINE MOD 6.5X30 (Screw) IMPLANT
SPACER OLIF DOME 9X26X9.5 10D (Spacer) IMPLANT
SPIKE FLUID TRANSFER (MISCELLANEOUS) ×1 IMPLANT
SPONGE SURGIFOAM ABS GEL 100 (HEMOSTASIS) ×1 IMPLANT
SPONGE T-LAP 4X18 ~~LOC~~+RFID (SPONGE) IMPLANT
STRIP CLOSURE SKIN 1/2X4 (GAUZE/BANDAGES/DRESSINGS) IMPLANT
SUT PROLENE 6 0 BV (SUTURE) IMPLANT
SUT VIC AB 0 CT1 18XCR BRD8 (SUTURE) ×1 IMPLANT
SUT VIC AB 2-0 CT1 18 (SUTURE) ×1 IMPLANT
SUT VIC AB 3-0 SH 8-18 (SUTURE) ×1 IMPLANT
SYR 30ML SLIP (SYRINGE) ×1 IMPLANT
TOWEL GREEN STERILE (TOWEL DISPOSABLE) ×1 IMPLANT
TOWEL GREEN STERILE FF (TOWEL DISPOSABLE) ×1 IMPLANT
TRAY FOLEY MTR SLVR 16FR STAT (SET/KITS/TRAYS/PACK) ×1 IMPLANT
WATER STERILE IRR 1000ML POUR (IV SOLUTION) ×1 IMPLANT

## 2024-03-24 NOTE — Transfer of Care (Signed)
 Immediate Anesthesia Transfer of Care Note  Patient: Keith Oconnell  Procedure(s) Performed: POSTERIOR LUMBAR INTERBODY FUSION LUMBAR THREE-FOUR (Spine Lumbar)  Patient Location: PACU  Anesthesia Type:General  Level of Consciousness: awake and alert   Airway & Oxygen Therapy: Patient Spontanous Breathing and Patient connected to face mask oxygen  Post-op Assessment: Report given to RN and Post -op Vital signs reviewed and stable  Post vital signs: Reviewed and stable  Last Vitals:  Vitals Value Taken Time  BP 125/64 03/24/24 1845  Temp 35.9 C 03/24/24 1845  Pulse 75 03/24/24 1847  Resp 17 03/24/24 1847  SpO2 99 % 03/24/24 1847  Vitals shown include unfiled device data.  Last Pain:  Vitals:   03/24/24 1202  TempSrc:   PainSc: 5       Patients Stated Pain Goal: 2 (03/24/24 1202)  Complications: No notable events documented.

## 2024-03-24 NOTE — Op Note (Signed)
 03/24/2024  6:56 PM  PATIENT:  Keith Oconnell  73 y.o. male With neurogenic claudication and lumbar stenosis at L3/4 PRE-OPERATIVE DIAGNOSIS:  Adjacent segment disease of lumbar spine with history of fusion procedure L3/4  POST-OPERATIVE DIAGNOSIS:  Adjacent segment disease of lumbar spine with history of fusion procedure L3/4  PROCEDURE:  Procedure(s): POSTERIOR LUMBAR INTERBODY FUSION LUMBAR THREE-FOUR Segmental pedicle screw fixation L3-5  SURGEON:  Surgeon(s): Audie Bleacher, MD Garry Kansas, MD  ASSISTANTS:Jenkins, Susana Enter  ANESTHESIA:   general  EBL:  Total I/O In: 1100 [I.V.:1000; IV Piggyback:100] Out: 575 [Urine:375; Blood:200]  BLOOD ADMINISTERED:none  CELL SAVER GIVEN:none  COUNT:per nursing  DRAINS: none   SPECIMEN:  No Specimen  DICTATION: [name] is a 73 y.o. male whom was taken to the operating room intubated, and placed under a general anesthetic without difficulty. A foley catheter was placed under sterile conditions. He was positioned prone on a Jackson table with all pressure points properly padded.  His lumbar region was prepped and draped in a sterile manner. I infiltrated 0cc's 1/2%lidocaine /1:2000,000 strength epinephrine  into the planned incision. I opened the skin with a 10 blade and took the incision down to the thoracolumbar fascia. I exposed the lamina of L2, and L3 and the hardware at L4, and L5 in a subperiosteal fashion bilaterally. I confirmed my location with an intraoperative xray.  I placed self retaining retractors and started the decompression.  I decompressed the spinal canal via semihemilaminectomies of L3. I exposed the thecal sac and decompressed the foramina and lateral recesses on both sides.  I exposed the disc spaces on both sides and prepared for the PLIF. PLIF's were performed at L3/4 in the same fashion. I opened the disc space with a 15 blade then used a variety of instruments to remove the disc and prepare the space  for the arthrodesis. I used curettes, rongeurs, punches, shavers for the disc space, and rasps in the discetomy. I measured the disc space and placed expandable Half dome X cages packed with autograft morsels into the disc space(s). I also placed autograft morsels in the disc space anterior to the cages, and between the cages.   I placed pedicle screws at L3, using fluoroscopic guidance. I drilled a pilot hole, then cannulated the pedicle with a drill at each site. I then tapped each pedicle, assessing each site for pedicle violations. No cutouts were appreciated. Screws (nuvasive mas plif) were then placed at each site without difficulty. We attached rods and locking caps with the appropriate tools. The locking caps were secured with torque limited screwdrivers. Final films were performed and the final construct appeared to be in good position.  We closed the wound in a layered fashion. We approximated the thoracolumbar fascia, subcutaneous, and subcuticular planes with vicryl sutures. I used Dermabond, and an occlusive bandage for a sterile dressing.     PLAN OF CARE: Admit to inpatient   PATIENT DISPOSITION:  PACU - hemodynamically stable.   Delay start of Pharmacological VTE agent (>24hrs) due to surgical blood loss or risk of bleeding:  yes

## 2024-03-24 NOTE — H&P (Signed)
 BP (!) 154/59   Pulse (!) 55   Temp 97.8 F (36.6 C) (Oral)   Resp 17   Ht 6' (1.829 m)   Wt 90.7 kg   SpO2 98%   BMI 27.12 kg/m   He returns today after an MRI of the lumbar spine.  What the MRI shows is he has adjacent segment disease at L3-4, where he is stenotic.  He speaks about pain, especially when he bends over; that is when he has the worst pain.  He can get up and move around and do things, but that is the worst.  He has classic stenosis.  He has classic adjacent segment disease anatomically.  He certainly has facet arthropathy.  He weighs 204 pounds.    Pain is 6/10.  What I will have him do is undergo an injection at L3-4 and see how that does.  He would love to not undergo any kind of operation, and this certainly he can do, he does not have any critical issue on his scan.  He is alert, oriented x4, answers all questions appropriately.  Gait is antalgic, favors the right lower extremity. Allergies  Allergen Reactions   Morphine  And Codeine Nausea And Vomiting and Nausea Only   Tizanidine      Makes mouth dry   Tramadol     Kept him up all night and drove him crazy   Past Medical History:  Diagnosis Date   Arthritis    Diabetes mellitus without complication (HCC)    Dyspnea    Elevated coronary artery calcium score 12/24/2023   GERD (gastroesophageal reflux disease)    Heart murmur    never given him any problems   History of blood transfusion    History of elevated glucose    per pt- pre diabetic   History of kidney stones    Hyperlipidemia    Hypertension    Pneumonia    Psoriasis    Past Surgical History:  Procedure Laterality Date   BACK SURGERY  1995   lumbar   COLONOSCOPY     CYSTOSCOPY WITH RETROGRADE PYELOGRAM, URETEROSCOPY AND STENT PLACEMENT Left 08/14/2023   Procedure: CYSTOSCOPY WITH LEFT RETROGRADE PYELOGRAM, URETEROSCOPY HOLMIUM LASER AND STENT PLACEMENT;  Surgeon: Samson Croak, MD;  Location: [facility];  Service: Urology;  Laterality: Left;  60  MINS FOR CASE   DENTAL SURGERY     pins placed upper front tooth   EXTRACORPOREAL SHOCK WAVE LITHOTRIPSY Left 06/29/2023   Procedure: LEFT EXTRACORPOREAL SHOCK WAVE LITHOTRIPSY (ESWL);  Surgeon: Samson Croak, MD;  Location: [facility];  Service: Urology;  Laterality: Left;   LUMBAR LAMINECTOMY/DECOMPRESSION MICRODISCECTOMY N/A 11/07/2020   Procedure: Lumbar 4-5 Laminectomy/Foraminotomy;  Surgeon: Audie Bleacher, MD;  Location: [facility];  Service: Neurosurgery;  Laterality: N/A;  3C/RM 21   ORIF HUMERUS FRACTURE Left 07/11/2023   Procedure: OPEN REDUCTION INTERNAL FIXATION (ORIF) HUMERAL SHAFT FRACTURE;  Surgeon: Winston Hawking, MD;  Location: [facility];  Service: Orthopedics;  Laterality: Left;   REVERSE SHOULDER ARTHROPLASTY Left 03/17/2014   Procedure: LEFT REVERSE SHOULDER ARTHROPLASTY;  Surgeon: Lorriane Rote, MD;  Location: [facility];  Service: Orthopedics;  Laterality: Left;   SHOULDER OPEN ROTATOR CUFF REPAIR  10/07/2011   Procedure: ROTATOR CUFF REPAIR SHOULDER OPEN;  Surgeon: Florencia Hunter;  Location: [facility];  Service: Orthopedics;  Laterality: Right;  Open Rotator Cuff Repair/Acrominectomy with Graft and Anchors   SHOULDER OPEN ROTATOR CUFF REPAIR Left 06/08/2013   Procedure:  OPEN LEFT SHOULDER ROTATOR CUFF REPAIR , complex repair with 3 anchors and graft;  Surgeon: Florencia Hunter, MD;  Location: [facility];  Service: Orthopedics;  Laterality: Left;   WRIST ARTHROSCOPY Left 10/11/2013   Procedure: LEFT ARTHROSCOPY WRIST DEBRIDMENT/SHRINKAGE;  Surgeon: Kemp Patter, MD;  Location: [facility];  Service: Orthopedics;  Laterality: Left;   WRIST SURGERY Right    repair fractured wrist   No family history on file. Prior to Admission medications   Medication Sig Start Date End Date Taking? Authorizing Provider  amLODipine  (NORVASC ) 10 MG tablet Take 10 mg by mouth at bedtime.    Yes [provider]  aspirin  EC 81 MG tablet Take 81 mg by mouth daily. Swallow  whole.   Yes [provider]  Blood Glucose Monitoring Suppl (ONE TOUCH ULTRA 2) W/DEVICE KIT  02/23/14  Yes [provider]  cholecalciferol  (VITAMIN D ) 25 MCG (1000 UNIT) tablet Take 1,000 Units by mouth daily.   Yes [provider]  Coenzyme Q10 (CO Q 10 PO) Take 200 mg by mouth daily.   Yes [provider]  cyanocobalamin  (VITAMIN B12) 500 MCG tablet Take 500 mcg by mouth daily.   Yes [provider]  ezetimibe  (ZETIA ) 10 MG tablet Take 10 mg by mouth daily.   Yes [provider]  fenofibrate  (TRICOR ) 145 MG tablet Take 145 mg by mouth at bedtime.  09/17/20  Yes [provider]  gabapentin  (NEURONTIN ) 100 MG capsule Take 300 mg by mouth daily. 10/29/20  Yes [provider]  Garlic  (GARLIQUE) 400 MG TBEC Take 400 mg by mouth daily.   Yes [provider]  HYDROcodone -acetaminophen  (NORCO) 5-325 MG tablet Take 1 tablet by mouth every 4 (four) hours as needed. Patient taking differently: Take 1 tablet by mouth 3 (three) times daily as needed for moderate pain (pain score 4-6). 08/14/23  Yes Samson Croak, MD  methocarbamol  (ROBAXIN ) 500 MG tablet Take 1 tablet (500 mg total) by mouth every 8 (eight) hours as needed for muscle spasms. 07/11/23  Yes Winston Hawking, MD  Omega-3 Fatty Acids (OMEGA 3 PO) Take 1,400 mg by mouth in the morning and at bedtime.   Yes [provider]  pravastatin  (PRAVACHOL ) 80 MG tablet Take 80 mg by mouth at bedtime. 09/17/20  Yes [provider]  valsartan -hydrochlorothiazide  (DIOVAN -HCT) 320-25 MG tablet Take 1 tablet by mouth daily.   Yes [provider]  acetaminophen  (TYLENOL ) 500 MG tablet Take 2 tablets (1,000 mg total) by mouth every 6 (six) hours as needed. Patient not taking: Reported on 07/11/2023 06/18/23   Marlin Simmonds, PA-C  HYDROcodone -acetaminophen  (NORCO/VICODIN) 5-325 MG tablet Take 1-2 tablets by mouth every 4 (four) hours as needed for moderate  pain or severe pain. Patient not taking: Reported on 03/10/2024 07/11/23 07/10/24  Winston Hawking, MD  methocarbamol  (ROBAXIN ) 500 MG tablet Take 1 tablet (500 mg total) by mouth every 8 (eight) hours as needed for muscle spasms. Patient not taking: Reported on 07/30/2023 06/18/23   Marlin Simmonds, PA-C  ONE TOUCH ULTRA TEST test strip  02/23/14   [provider]  tamsulosin  (FLOMAX ) 0.4 MG CAPS capsule Take 1 capsule (0.4 mg total) by mouth daily. Patient not taking: Reported on 03/10/2024 06/29/23   Samson Croak, MD   OR for PLIF  risks and benefits explained.

## 2024-03-24 NOTE — Anesthesia Procedure Notes (Signed)
 Procedure Name: Intubation Date/Time: 03/24/2024 2:14 PM  Performed by: Derral Flick, CRNAPre-anesthesia Checklist: Patient identified, Emergency Drugs available, Suction available and Patient being monitored Patient Re-evaluated:Patient Re-evaluated prior to induction Oxygen Delivery Method: Circle system utilized Preoxygenation: Pre-oxygenation with 100% oxygen Induction Type: IV induction Ventilation: Mask ventilation with difficulty, Oral airway inserted - appropriate to patient size and Two handed mask ventilation required Laryngoscope Size: Mac and 4 Grade View: Grade I Tube type: Oral Tube size: 7.5 mm Number of attempts: 1 Airway Equipment and Method: Stylet and Oral airway Placement Confirmation: ETT inserted through vocal cords under direct vision, positive ETCO2 and breath sounds checked- equal and bilateral Secured at: 23 cm Tube secured with: Tape Dental Injury: Teeth and Oropharynx as per pre-operative assessment

## 2024-03-25 DIAGNOSIS — M48062 Spinal stenosis, lumbar region with neurogenic claudication: Secondary | ICD-10-CM | POA: Diagnosis not present

## 2024-03-25 MED ORDER — ALUM & MAG HYDROXIDE-SIMETH 200-200-20 MG/5ML PO SUSP: 30.0000 mL | Freq: Four times a day (QID) | ORAL | Status: DC | PRN

## 2024-03-25 MED ORDER — CYCLOBENZAPRINE HCL 10 MG PO TABS: 10.0000 mg | ORAL_TABLET | Freq: Three times a day (TID) | ORAL | 0 refills | Status: DC | PRN

## 2024-03-25 MED ORDER — OXYCODONE HCL 5 MG PO TABS: 5.0000 mg | ORAL_TABLET | Freq: Four times a day (QID) | ORAL | 0 refills | Status: AC | PRN

## 2024-03-25 MED ORDER — PANTOPRAZOLE SODIUM 40 MG PO TBEC
40.0000 mg | DELAYED_RELEASE_TABLET | Freq: Two times a day (BID) | ORAL | Status: DC
  Administered 2024-03-25: 40 mg via ORAL
  Filled 2024-03-25: qty 1

## 2024-03-25 NOTE — Discharge Instructions (Signed)

## 2024-03-25 NOTE — Progress Notes (Signed)
 Pt discharge and AVS education completed with pt and pt family. All questions answered, and family verbalized understanding. Pt's back surgical incision drsg changed with family at bedside, following Dr. Charlestine Conquest instructions. RN educated pt and family about drsg changes, when to call doctor and seek assistance, and spinal precautions, pt and pt family verbalized understanding. Pt provided with gauze, and mepilex foam for dressing changes at home. Pt to pick up prescribed prescription at preferred pharmacy on file. Physical prescription for Flexeril, given to pt family per pt request. PIV removed without complications. Tele removed. Pt transported off unit via wheelchair with hospital volunteer services, family, and all personal belongings.

## 2024-03-25 NOTE — Anesthesia Postprocedure Evaluation (Signed)
 Anesthesia Post Note  Patient: Keith Oconnell  Procedure(s) Performed: POSTERIOR LUMBAR INTERBODY FUSION LUMBAR THREE-FOUR (Spine Lumbar)     Patient location during evaluation: PACU Anesthesia Type: General Level of consciousness: sedated and patient cooperative Pain management: pain level controlled Vital Signs Assessment: post-procedure vital signs reviewed and stable Respiratory status: spontaneous breathing Cardiovascular status: stable Anesthetic complications: no   No notable events documented.  Last Vitals:  Vitals:   03/24/24 2308 03/25/24 0300  BP: 138/61 (!) 106/57  Pulse: 77 71  Resp: 16 18  Temp: 36.4 C 36.8 C  SpO2: 97% 99%    Last Pain:  Vitals:   03/25/24 0418  TempSrc:   PainSc: 3                  Gorman Laughter

## 2024-03-25 NOTE — Progress Notes (Signed)
   03/25/24 1236  Provider Notification  Provider Name/Title Dr. Audie Bleacher  Date Provider Notified 03/25/24  Time Provider Notified 1200  Method of Notification Face-to-face  Notification Reason Other (Comment) (Pt's surgical incision drsg to posterior back saturated with blood)  Provider response At bedside;No new orders  Date of Provider Response 03/25/24  Time of Provider Response 1220   Pt's surgical drsg to posterior back saturated with serosanguineous drainage. Dr. Michale Age notified and at bedside. Dr. Michale Age changed surgical drsg. Family at bedside. Pt and family is aware about order for discharge.  PT to evaluate pt prior to discharge.
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
## 2024-03-25 NOTE — Discharge Summary (Signed)
 Physician Discharge Summary  Patient ID: BLISS TSANG MRN: [id number] DOB/AGE: June 11, 1951 73 y.o.  Admit date: 03/24/2024 Discharge date: 03/25/2024  Admission Diagnoses:Lumbar adjacent segment disease L3/4 Lumbar spinal stenosis Neurogenic claudication  Discharge Diagnoses: same Principal Problem:   Lumbar adjacent segment disease with spondylolisthesis Active Problems:   Lumbar spinal stenosis   Discharged Condition: good  Hospital Course: Mr. Jorgensen was admitted and taken to the operating room for a decompression at L3/4, Plif, and extension of his previous fusion at L4/5. Post op he has had oozing from the wound. He is ambulating, voiding, and tolerating a regular diet.   Treatments: surgery: POSTERIOR LUMBAR INTERBODY FUSION LUMBAR THREE-FOUR Segmental pedicle screw fixation L3-5    Discharge Exam: Blood pressure (!) 126/52, pulse (!) 50, temperature 97.6 F (36.4 C), temperature source Oral, resp. rate 15, height 6' (1.829 m), weight 90.7 kg, SpO2 99%. General appearance: alert, cooperative, appears stated age, and no distress  Disposition: Discharge disposition: 01-Home or Self Care      Adjacent segment disease of lumbar spine with history of fusion procedure  Allergies as of 03/25/2024       Reactions   Morphine  And Codeine Nausea And Vomiting, Nausea Only   Tizanidine     Makes mouth dry   Tramadol    Kept him up all night and drove him crazy        Medication List     TAKE these medications    acetaminophen  500 MG tablet Commonly known as: TYLENOL  Take 2 tablets (1,000 mg total) by mouth every 6 (six) hours as needed.   amLODipine  10 MG tablet Commonly known as: NORVASC  Take 10 mg by mouth at bedtime.   aspirin  EC 81 MG tablet Take 81 mg by mouth daily. Swallow whole.   cholecalciferol  25 MCG (1000 UNIT) tablet Commonly known as: VITAMIN D3 Take 1,000 Units by mouth daily.   CO Q 10 PO Take 200 mg by mouth daily.   cyanocobalamin   500 MCG tablet Commonly known as: VITAMIN B12 Take 500 mcg by mouth daily.   cyclobenzaprine 10 MG tablet Commonly known as: FLEXERIL Take 1 tablet (10 mg total) by mouth 3 (three) times daily as needed for muscle spasms.   ezetimibe  10 MG tablet Commonly known as: ZETIA  Take 10 mg by mouth daily.   fenofibrate  145 MG tablet Commonly known as: TRICOR  Take 145 mg by mouth at bedtime.   gabapentin  100 MG capsule Commonly known as: NEURONTIN  Take 300 mg by mouth daily.   Garlique 400 MG Tbec Generic drug: Garlic  Take 400 mg by mouth daily.   HYDROcodone -acetaminophen  5-325 MG tablet Commonly known as: NORCO/VICODIN Take 1-2 tablets by mouth every 4 (four) hours as needed for moderate pain or severe pain. What changed: Another medication with the same name was changed. Make sure you understand how and when to take each.   HYDROcodone -acetaminophen  5-325 MG tablet Commonly known as: Norco Take 1 tablet by mouth every 4 (four) hours as needed. What changed:  when to take this reasons to take this   methocarbamol  500 MG tablet Commonly known as: ROBAXIN  Take 1 tablet (500 mg total) by mouth every 8 (eight) hours as needed for muscle spasms.   methocarbamol  500 MG tablet Commonly known as: ROBAXIN  Take 1 tablet (500 mg total) by mouth every 8 (eight) hours as needed for muscle spasms.   OMEGA 3 PO Take 1,400 mg by mouth in the morning and at bedtime.   ONE TOUCH ULTRA 2  w/Device Kit   ONE TOUCH ULTRA TEST test strip Generic drug: glucose blood   oxyCODONE  5 MG immediate release tablet Commonly known as: Oxy IR/ROXICODONE  Take 1 tablet (5 mg total) by mouth every 6 (six) hours as needed for up to 8 days for moderate pain (pain score 4-6).   pravastatin  80 MG tablet Commonly known as: PRAVACHOL  Take 80 mg by mouth at bedtime.   tamsulosin  0.4 MG Caps capsule Commonly known as: FLOMAX  Take 1 capsule (0.4 mg total) by mouth daily.   valsartan -hydrochlorothiazide   320-25 MG tablet Commonly known as: DIOVAN -HCT Take 1 tablet by mouth daily.        Follow-up Information     Audie Bleacher, MD Follow up.   Specialty: Neurosurgery Why: keep your scheduled appointment Contact information: [address] [place] [address] [phone]                 Signed: Audie Bleacher 03/25/2024, 12:22 PM
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
## 2024-05-17 ENCOUNTER — Other Ambulatory Visit: Payer: Self-pay | Admitting: Neurosurgery

## 2024-05-17 DIAGNOSIS — Z981 Arthrodesis status: Secondary | ICD-10-CM

## 2024-05-20 ENCOUNTER — Ambulatory Visit
Admission: RE | Admit: 2024-05-20 | Discharge: 2024-05-20 | Disposition: A | Discharge status: Home / Self Care | Source: Ambulatory Visit | Attending: Neurosurgery | Admitting: Neurosurgery

## 2024-05-20 DIAGNOSIS — M51369 Other intervertebral disc degeneration, lumbar region without mention of lumbar back pain or lower extremity pain: Secondary | ICD-10-CM

## 2024-05-20 DIAGNOSIS — Z981 Arthrodesis status: Secondary | ICD-10-CM

## 2024-05-31 ENCOUNTER — Other Ambulatory Visit: Payer: Self-pay | Admitting: Neurosurgery

## 2024-05-31 DIAGNOSIS — Z6829 Body mass index (BMI) 29.0-29.9, adult: Secondary | ICD-10-CM

## 2024-05-31 DIAGNOSIS — T1590XA Foreign body on external eye, part unspecified, unspecified eye, initial encounter: Secondary | ICD-10-CM

## 2024-06-06 ENCOUNTER — Ambulatory Visit
Admission: RE | Admit: 2024-06-06 | Discharge: 2024-06-06 | Disposition: A | Discharge status: Home / Self Care | Source: Ambulatory Visit | Attending: Neurosurgery | Admitting: Neurosurgery

## 2024-06-06 DIAGNOSIS — Z6829 Body mass index (BMI) 29.0-29.9, adult: Secondary | ICD-10-CM

## 2024-06-06 DIAGNOSIS — T1590XA Foreign body on external eye, part unspecified, unspecified eye, initial encounter: Secondary | ICD-10-CM

## 2024-06-06 MED ORDER — GADOPICLENOL 0.5 MMOL/ML IV SOLN
9.0000 mL | Freq: Once | INTRAVENOUS | Status: AC | PRN
  Administered 2024-06-06: 9 mL via INTRAVENOUS

## 2024-06-16 ENCOUNTER — Other Ambulatory Visit: Payer: Self-pay | Admitting: Neurosurgery

## 2024-06-16 ENCOUNTER — Encounter: Payer: Self-pay | Admitting: Neurosurgery

## 2024-06-16 DIAGNOSIS — M48062 Spinal stenosis, lumbar region with neurogenic claudication: Secondary | ICD-10-CM

## 2024-06-21 ENCOUNTER — Other Ambulatory Visit: Payer: Self-pay | Admitting: Neurosurgery

## 2024-06-21 DIAGNOSIS — M48062 Spinal stenosis, lumbar region with neurogenic claudication: Secondary | ICD-10-CM

## 2024-06-21 DIAGNOSIS — Z981 Arthrodesis status: Secondary | ICD-10-CM

## 2024-07-04 NOTE — Discharge Instructions (Signed)
     Discogram Post Procedure Discharge Instructions  May resume a regular diet and any medications that you routinely take (including pain medications). No driving day of procedure. Upon discharge go home and rest for at least 4 hours.  May use an ice pack as needed to injection sites on back.  Ice to back 30 minutes on and 30 minutes off, all day. May remove bandades later, today. It is not unusual to be sore for several days after this procedure.    Please contact our office at [phone] for the following symptoms:  Fever greater than 100 degrees Increased swelling, pain, or redness at injection site.   Thank you for visiting [facility].  

## 2024-07-05 ENCOUNTER — Ambulatory Visit
Admission: RE | Admit: 2024-07-05 | Discharge: 2024-07-05 | Disposition: A | Discharge status: Home / Self Care | Source: Ambulatory Visit | Attending: Neurosurgery | Admitting: Neurosurgery

## 2024-07-05 ENCOUNTER — Other Ambulatory Visit: Payer: Self-pay | Admitting: Neurosurgery

## 2024-07-05 DIAGNOSIS — M51369 Other intervertebral disc degeneration, lumbar region without mention of lumbar back pain or lower extremity pain: Secondary | ICD-10-CM

## 2024-07-05 DIAGNOSIS — M48062 Spinal stenosis, lumbar region with neurogenic claudication: Secondary | ICD-10-CM

## 2024-07-05 MED ORDER — SODIUM CHLORIDE 0.9 % IV SOLN: INTRAVENOUS | Status: DC

## 2024-07-05 MED ORDER — FENTANYL CITRATE PF 50 MCG/ML IJ SOSY
25.0000 ug | PREFILLED_SYRINGE | INTRAMUSCULAR | Status: DC | PRN
  Administered 2024-07-05: 25 ug via INTRAVENOUS

## 2024-07-05 MED ORDER — CEFAZOLIN SODIUM-DEXTROSE 2-4 GM/100ML-% IV SOLN
2.0000 g | INTRAVENOUS | Status: AC
  Administered 2024-07-05: 2 g via INTRAVENOUS

## 2024-07-05 MED ORDER — MIDAZOLAM HCL 2 MG/2ML IJ SOLN
1.0000 mg | INTRAMUSCULAR | Status: DC | PRN
  Administered 2024-07-05 (×2): 1 mg via INTRAVENOUS

## 2024-07-05 MED ORDER — KETOROLAC TROMETHAMINE 30 MG/ML IJ SOLN: 30.0000 mg | Freq: Once | INTRAMUSCULAR | Status: DC

## 2024-07-05 MED ORDER — IOPAMIDOL (ISOVUE-M 200) INJECTION 41%
2.5000 mL | Freq: Once | INTRAMUSCULAR | Status: AC
  Administered 2024-07-05: 2.5 mL

## 2024-07-05 MED ORDER — ACETAMINOPHEN 10 MG/ML IV SOLN
1000.0000 mg | Freq: Once | INTRAVENOUS | Status: AC
  Administered 2024-07-05: 1000 mg via INTRAVENOUS

## 2024-07-05 NOTE — Progress Notes (Signed)
 Pt back in nursing recovery area. Pt still drowsy from procedure but will wake up when spoken to. Pt follows commands, talks in complete sentences and has no complaints at this time. Pt will remain in nurses station until discharged by Radiologist.

## 2024-07-18 ENCOUNTER — Other Ambulatory Visit: Payer: Self-pay | Admitting: Neurosurgery

## 2024-07-22 NOTE — Pre-Procedure Instructions (Addendum)
 Surgical Instructions   Your procedure is scheduled on July 27, 2024. Report to [facility] Main Entrance A at 5:30 A.M., then check in with the Admitting office. Any questions or running late day of surgery: call [phone]  Questions prior to your surgery date: call [phone], Monday-Friday, 8am-4pm. If you experience any cold or flu symptoms such as cough, fever, chills, shortness of breath, etc. between now and your scheduled surgery, please notify us  at the above number.     Remember:  Do not eat or drink after midnight the night before your surgery    Take these medicines the morning of surgery with A SIP OF WATER : amLODipine  (NORVASC )  ezetimibe  (ZETIA )    May take these medicines IF NEEDED: HYDROcodone -acetaminophen  (NORCO)  methocarbamol  (ROBAXIN )    Follow your surgeon's instructions on when to stop Aspirin .  If no instructions were given by your surgeon then you will need to call the office to get those instructions.     One week prior to surgery, STOP taking any Aleve, Naproxen, Ibuprofen, Motrin, Advil, Goody's, BC's, all herbal medications, fish oil (OMEGA 3 PO), and non-prescription vitamins.   HOW TO MANAGE YOUR DIABETES BEFORE AND AFTER SURGERY  Why is it important to control my blood sugar before and after surgery? Improving blood sugar levels before and after surgery helps healing and can limit problems. A way of improving blood sugar control is eating a healthy diet by:  Eating less sugar and carbohydrates  Increasing activity/exercise  Talking with your doctor about reaching your blood sugar goals High blood sugars (greater than 180 mg/dL) can raise your risk of infections and slow your recovery, so you will need to focus on controlling your diabetes during the weeks before surgery. Make sure that the doctor who takes care of your diabetes knows about your planned surgery including the date and location.  How do I manage my blood sugar before  surgery? Check your blood sugar at least 4 times a day, starting 2 days before surgery, to make sure that the level is not too high or low.  Check your blood sugar the morning of your surgery when you wake up and every 2 hours until you get to the Short Stay unit.  If your blood sugar is less than 70 mg/dL, you will need to treat for low blood sugar: Do not take insulin . Treat a low blood sugar (less than 70 mg/dL) with  cup of clear juice (cranberry or apple), 4 glucose tablets, OR glucose gel. Recheck blood sugar in 15 minutes after treatment (to make sure it is greater than 70 mg/dL). If your blood sugar is not greater than 70 mg/dL on recheck, call [phone] for further instructions. Report your blood sugar to the short stay nurse when you get to Short Stay.  If you are admitted to the hospital after surgery: Your blood sugar will be checked by the staff and you will probably be given insulin  after surgery (instead of oral diabetes medicines) to make sure you have good blood sugar levels. The goal for blood sugar control after surgery is 80-180 mg/dL.                      Do NOT Smoke (Tobacco/Vaping) for 24 hours prior to your procedure.  If you use a CPAP at night, you may bring your mask/headgear for your overnight stay.   You will be asked to remove any contacts, glasses, piercing's, hearing aid's, dentures/partials prior to surgery.  Please bring cases for these items if needed.    Patients discharged the day of surgery will not be allowed to drive home, and someone needs to stay with them for 24 hours.  SURGICAL WAITING ROOM VISITATION Patients may have no more than 2 support people in the waiting area - these visitors may rotate.   Pre-op nurse will coordinate an appropriate time for 1 ADULT support person, who may not rotate, to accompany patient in pre-op.  Children under the age of 66 must have an adult with them who is not the patient and must remain in the main waiting  area with an adult.  If the patient needs to stay at the hospital during part of their recovery, the visitor guidelines for inpatient rooms apply.  Please refer to the [facility] website for the visitor guidelines for any additional information.   If you received a COVID test during your pre-op visit  it is requested that you wear a mask when out in public, stay away from anyone that may not be feeling well and notify your surgeon if you develop symptoms. If you have been in contact with anyone that has tested positive in the last 10 days please notify you surgeon.      Pre-operative 5 CHG Bathing Instructions   You can play a key role in reducing the risk of infection after surgery. Your skin needs to be as free of germs as possible. You can reduce the number of germs on your skin by washing with CHG (chlorhexidine  gluconate) soap before surgery. CHG is an antiseptic soap that kills germs and continues to kill germs even after washing.   DO NOT use if you have an allergy to chlorhexidine /CHG or antibacterial soaps. If your skin becomes reddened or irritated, stop using the CHG and notify one of our RNs at [phone].   Please shower with the CHG soap starting 4 days before surgery using the following schedule:     Please keep in mind the following:  DO NOT shave, including legs and underarms, starting the day of your first shower.   You may shave your face at any point before/day of surgery.  Place clean sheets on your bed the day you start using CHG soap. Use a clean washcloth (not used since being washed) for each shower. DO NOT sleep with pets once you start using the CHG.   CHG Shower Instructions:  Wash your face and private area with normal soap. If you choose to wash your hair, wash first with your normal shampoo.  After you use shampoo/soap, rinse your hair and body thoroughly to remove shampoo/soap residue.  Turn the water  OFF and apply about 3 tablespoons (45 ml) of CHG  soap to a CLEAN washcloth.  Apply CHG soap ONLY FROM YOUR NECK DOWN TO YOUR TOES (washing for 3-5 minutes)  DO NOT use CHG soap on face, private areas, open wounds, or sores.  Pay special attention to the area where your surgery is being performed.  If you are having back surgery, having someone wash your back for you may be helpful. Wait 2 minutes after CHG soap is applied, then you may rinse off the CHG soap.  Pat dry with a clean towel  Put on clean clothes/pajamas   If you choose to wear lotion, please use ONLY the CHG-compatible lotions that are listed below.  Additional instructions for the day of surgery: DO NOT APPLY any lotions, deodorants, cologne, or perfumes.   Do not  bring valuables to the hospital. [facility] is not responsible for any belongings/valuables. Do not wear nail polish, gel polish, artificial nails, or any other type of covering on natural nails (fingers and toes) Do not wear jewelry or makeup Put on clean/comfortable clothes.  Please brush your teeth.  Ask your nurse before applying any prescription medications to the skin.     CHG Compatible Lotions   Aveeno Moisturizing lotion  Cetaphil Moisturizing Cream  Cetaphil Moisturizing Lotion  Clairol Herbal Essence Moisturizing Lotion, Dry Skin  Clairol Herbal Essence Moisturizing Lotion, Extra Dry Skin  Clairol Herbal Essence Moisturizing Lotion, Normal Skin  Curel Age Defying Therapeutic Moisturizing Lotion with Alpha Hydroxy  Curel Extreme Care Body Lotion  Curel Soothing Hands Moisturizing Hand Lotion  Curel Therapeutic Moisturizing Cream, Fragrance-Free  Curel Therapeutic Moisturizing Lotion, Fragrance-Free  Curel Therapeutic Moisturizing Lotion, Original Formula  Eucerin Daily Replenishing Lotion  Eucerin Dry Skin Therapy Plus Alpha Hydroxy Crme  Eucerin Dry Skin Therapy Plus Alpha Hydroxy Lotion  Eucerin Original Crme  Eucerin Original Lotion  Eucerin Plus Crme Eucerin Plus Lotion  Eucerin  TriLipid Replenishing Lotion  Keri Anti-Bacterial Hand Lotion  Keri Deep Conditioning Original Lotion Dry Skin Formula Softly Scented  Keri Deep Conditioning Original Lotion, Fragrance Free Sensitive Skin Formula  Keri Lotion Fast Absorbing Fragrance Free Sensitive Skin Formula  Keri Lotion Fast Absorbing Softly Scented Dry Skin Formula  Keri Original Lotion  Keri Skin Renewal Lotion Keri Silky Smooth Lotion  Keri Silky Smooth Sensitive Skin Lotion  Nivea Body Creamy Conditioning Oil  Nivea Body Extra Enriched Lotion  Nivea Body Original Lotion  Nivea Body Sheer Moisturizing Lotion Nivea Crme  Nivea Skin Firming Lotion  NutraDerm 30 Skin Lotion  NutraDerm Skin Lotion  NutraDerm Therapeutic Skin Cream  NutraDerm Therapeutic Skin Lotion  ProShield Protective Hand Cream  Provon moisturizing lotion  Please read over the following fact sheets that you were given.

## 2024-07-25 ENCOUNTER — Encounter (HOSPITAL_COMMUNITY): Payer: Self-pay

## 2024-07-25 ENCOUNTER — Encounter (HOSPITAL_COMMUNITY)
Admission: RE | Admit: 2024-07-25 | Discharge: 2024-07-25 | Disposition: A | Discharge status: Home / Self Care | Source: Ambulatory Visit | Attending: Neurosurgery

## 2024-07-25 ENCOUNTER — Other Ambulatory Visit: Payer: Self-pay

## 2024-07-25 VITALS — BP 167/69 | HR 55 | Temp 98.0°F | Resp 17 | Ht 72.0 in | Wt 198.3 lb

## 2024-07-25 DIAGNOSIS — M48062 Spinal stenosis, lumbar region with neurogenic claudication: Secondary | ICD-10-CM

## 2024-07-25 DIAGNOSIS — Z79899 Other long term (current) drug therapy: Secondary | ICD-10-CM | POA: Insufficient documentation

## 2024-07-25 DIAGNOSIS — I2510 Atherosclerotic heart disease of native coronary artery without angina pectoris: Secondary | ICD-10-CM | POA: Diagnosis not present

## 2024-07-25 DIAGNOSIS — Z01812 Encounter for preprocedural laboratory examination: Secondary | ICD-10-CM | POA: Insufficient documentation

## 2024-07-25 DIAGNOSIS — I10 Essential (primary) hypertension: Secondary | ICD-10-CM | POA: Diagnosis not present

## 2024-07-25 DIAGNOSIS — E785 Hyperlipidemia, unspecified: Secondary | ICD-10-CM | POA: Diagnosis not present

## 2024-07-25 DIAGNOSIS — M19112 Post-traumatic osteoarthritis, left shoulder: Secondary | ICD-10-CM

## 2024-07-25 DIAGNOSIS — Z01818 Encounter for other preprocedural examination: Secondary | ICD-10-CM

## 2024-07-25 DIAGNOSIS — L409 Psoriasis, unspecified: Secondary | ICD-10-CM | POA: Diagnosis not present

## 2024-07-25 DIAGNOSIS — R7303 Prediabetes: Secondary | ICD-10-CM | POA: Insufficient documentation

## 2024-07-25 DIAGNOSIS — K219 Gastro-esophageal reflux disease without esophagitis: Secondary | ICD-10-CM | POA: Insufficient documentation

## 2024-07-25 DIAGNOSIS — I350 Nonrheumatic aortic (valve) stenosis: Secondary | ICD-10-CM | POA: Insufficient documentation

## 2024-07-25 DIAGNOSIS — M4316 Spondylolisthesis, lumbar region: Secondary | ICD-10-CM

## 2024-07-25 DIAGNOSIS — Z87442 Personal history of urinary calculi: Secondary | ICD-10-CM | POA: Diagnosis not present

## 2024-07-25 DIAGNOSIS — T8484XA Pain due to internal orthopedic prosthetic devices, implants and grafts, initial encounter: Secondary | ICD-10-CM | POA: Diagnosis not present

## 2024-07-25 DIAGNOSIS — Z7982 Long term (current) use of aspirin: Secondary | ICD-10-CM | POA: Insufficient documentation

## 2024-07-25 DIAGNOSIS — Z96612 Presence of left artificial shoulder joint: Secondary | ICD-10-CM | POA: Insufficient documentation

## 2024-07-25 LAB — BASIC METABOLIC PANEL WITH GFR
Anion gap: 7 (ref 5–15)
BUN: 14 mg/dL (ref 8–23)
CO2: 21 mmol/L — ABNORMAL LOW (ref 22–32)
Calcium: 9.8 mg/dL (ref 8.9–10.3)
Chloride: 110 mmol/L (ref 98–111)
Creatinine, Ser: 1 mg/dL (ref 0.61–1.24)
GFR, Estimated: >60 mL/min (ref >60)
Glucose, Bld: 96 mg/dL (ref 70–99)
Potassium: 3.7 mmol/L (ref 3.5–5.1)
Sodium: 138 mmol/L (ref 135–145)

## 2024-07-25 LAB — CBC
HCT: 38.7 % — ABNORMAL LOW (ref 39.0–52.0)
Hemoglobin: 12.7 g/dL — ABNORMAL LOW (ref 13.0–17.0)
MCH: 30.9 pg (ref 26.0–34.0)
MCHC: 32.8 g/dL (ref 30.0–36.0)
MCV: 94.2 fL (ref 80.0–100.0)
Platelets: 339 10*3/uL (ref 150–400)
RBC: 4.11 MIL/uL — ABNORMAL LOW (ref 4.22–5.81)
RDW: 15 % (ref 11.5–15.5)
WBC: 11.1 10*3/uL — ABNORMAL HIGH (ref 4.0–10.5)
nRBC: 0 % (ref 0.0–0.2)

## 2024-07-25 LAB — SURGICAL PCR SCREEN
MRSA, PCR: NEGATIVE
Staphylococcus aureus: POSITIVE — AB

## 2024-07-25 NOTE — Progress Notes (Signed)
 PCP - Dr. Lamar Cornet Cardiologist - Dr. Marcello Lennox - last office visit 02/15/2024  PPM/ICD - Denies Device Orders - n/a Rep Notified - n/a  Chest x-ray - n/a EKG - 03/24/2024 Stress Test - 01/28/2024 (CE) ECHO - 09/25/2023 (CE) Cardiac Cath - Denies CT Cardiac - 12/24/2023  Sleep Study - Denies CPAP - n/a  Pt is Pre-DM. He checks his blood sugar every other day and normal fasting is 110-120s. Last A1c 6.01 May 2024  Last dose of GLP1 agonist- n/a GLP1 instructions: n/a  Blood Thinner Instructions: n/a Aspirin  Instructions: Pt has already stopped his ASA with last dose taken over one week ago. Pt instructed to continue to hold medication until after surgery  NPO after midnight  COVID TEST- n/a   Anesthesia review: Yes. Hx of murmur, HTN, CAD, Pre-DM. Pt denies any new cardiopulmonary symptoms. He does endorse SOB with longer distances related to increased back pain. All discussed with Allison Zelenak, PA-C   Patient denies shortness of breath, fever, cough and chest pain at PAT appointment. Pt denies any respiratory illness/infection in the last two months.   All instructions explained to the patient, with a verbal understanding of the material. Patient agrees to go over the instructions while at home for a better understanding. Patient also instructed to self quarantine after being tested for COVID-19. The opportunity to ask questions was provided.
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
## 2024-07-27 NOTE — Progress Notes (Signed)
 SDW update.  Patient had PAT appointment on 07/25/2024.  Surgery rescheduled to 07/29/2024.  All labs were drawn 07/25/2024. Patient updated with arrival time of 1100. NPO continues. No further questions at this time.

## 2024-07-28 NOTE — Progress Notes (Signed)
 Talked to patient and informed him to arrive at [phone] for 1038 surgical start time.

## 2024-07-29 ENCOUNTER — Inpatient Hospital Stay (HOSPITAL_COMMUNITY): Admitting: Vascular Surgery

## 2024-07-29 ENCOUNTER — Inpatient Hospital Stay (HOSPITAL_COMMUNITY)
Admission: RE | Admit: 2024-07-29 | Discharge: 2024-07-30 | DRG: 517 | Disposition: A | Discharge status: Home / Self Care | Attending: Neurosurgery | Admitting: Neurosurgery

## 2024-07-29 ENCOUNTER — Other Ambulatory Visit: Payer: Self-pay

## 2024-07-29 ENCOUNTER — Inpatient Hospital Stay (HOSPITAL_COMMUNITY)

## 2024-07-29 ENCOUNTER — Encounter (HOSPITAL_COMMUNITY)
Admission: RE | Disposition: A | Discharge status: Home / Self Care | Payer: Self-pay | Source: Home / Self Care | Attending: Neurosurgery

## 2024-07-29 ENCOUNTER — Encounter (HOSPITAL_COMMUNITY): Payer: Self-pay | Admitting: Neurosurgery

## 2024-07-29 DIAGNOSIS — E785 Hyperlipidemia, unspecified: Secondary | ICD-10-CM | POA: Diagnosis present

## 2024-07-29 DIAGNOSIS — T8484XA Pain due to internal orthopedic prosthetic devices, implants and grafts, initial encounter: Secondary | ICD-10-CM | POA: Diagnosis present

## 2024-07-29 DIAGNOSIS — Z885 Allergy status to narcotic agent status: Secondary | ICD-10-CM | POA: Diagnosis not present

## 2024-07-29 DIAGNOSIS — Y838 Other surgical procedures as the cause of abnormal reaction of the patient, or of later complication, without mention of misadventure at the time of the procedure: Secondary | ICD-10-CM | POA: Diagnosis present

## 2024-07-29 DIAGNOSIS — Z888 Allergy status to other drugs, medicaments and biological substances status: Secondary | ICD-10-CM | POA: Diagnosis not present

## 2024-07-29 DIAGNOSIS — I10 Essential (primary) hypertension: Secondary | ICD-10-CM | POA: Diagnosis present

## 2024-07-29 DIAGNOSIS — T84038A Mechanical loosening of other internal prosthetic joint, initial encounter: Secondary | ICD-10-CM | POA: Diagnosis present

## 2024-07-29 DIAGNOSIS — Z96612 Presence of left artificial shoulder joint: Secondary | ICD-10-CM | POA: Diagnosis present

## 2024-07-29 DIAGNOSIS — E119 Type 2 diabetes mellitus without complications: Secondary | ICD-10-CM | POA: Diagnosis present

## 2024-07-29 HISTORY — PX: HARDWARE REMOVAL: SHX979

## 2024-07-29 LAB — CBC
HCT: 37.5 % — ABNORMAL LOW (ref 39.0–52.0)
Hemoglobin: 12.6 g/dL — ABNORMAL LOW (ref 13.0–17.0)
MCH: 31.4 pg (ref 26.0–34.0)
MCHC: 33.6 g/dL (ref 30.0–36.0)
MCV: 93.5 fL (ref 80.0–100.0)
Platelets: 288 K/uL (ref 150–400)
RBC: 4.01 MIL/uL — ABNORMAL LOW (ref 4.22–5.81)
RDW: 15.2 % (ref 11.5–15.5)
WBC: 12.6 K/uL — ABNORMAL HIGH (ref 4.0–10.5)
nRBC: 0 % (ref 0.0–0.2)

## 2024-07-29 LAB — GLUCOSE, CAPILLARY: Glucose-Capillary: 111 mg/dL — ABNORMAL HIGH (ref 70–99)

## 2024-07-29 LAB — CREATININE, SERUM
Creatinine, Ser: 1.15 mg/dL (ref 0.61–1.24)
GFR, Estimated: >60 mL/min (ref >60)

## 2024-07-29 SURGERY — REMOVAL, HARDWARE
Anesthesia: General

## 2024-07-29 MED ORDER — CEFAZOLIN SODIUM-DEXTROSE 2-4 GM/100ML-% IV SOLN
2.0000 g | INTRAVENOUS | Status: AC
  Administered 2024-07-29: 2 g via INTRAVENOUS
  Filled 2024-07-29: qty 100

## 2024-07-29 MED ORDER — GABAPENTIN 300 MG PO CAPS
300.0000 mg | ORAL_CAPSULE | Freq: Every day | ORAL | Status: DC
  Administered 2024-07-29: 300 mg via ORAL
  Filled 2024-07-29: qty 1

## 2024-07-29 MED ORDER — ACETAMINOPHEN 650 MG RE SUPP
650.0000 mg | RECTAL | Status: DC | PRN
Start: 2024-07-29 — End: 2024-07-30

## 2024-07-29 MED ORDER — HEPARIN SODIUM (PORCINE) 5000 UNIT/ML IJ SOLN
5000.0000 [IU] | Freq: Three times a day (TID) | INTRAMUSCULAR | Status: DC
  Administered 2024-07-29 – 2024-07-30 (×3): 5000 [IU] via SUBCUTANEOUS
  Filled 2024-07-29 (×3): qty 1

## 2024-07-29 MED ORDER — ROCURONIUM BROMIDE 10 MG/ML (PF) SYRINGE
PREFILLED_SYRINGE | INTRAVENOUS | Status: AC
  Filled 2024-07-29: qty 10

## 2024-07-29 MED ORDER — SODIUM CHLORIDE 0.9% FLUSH
3.0000 mL | Freq: Two times a day (BID) | INTRAVENOUS | Status: DC
  Administered 2024-07-29: 3 mL via INTRAVENOUS

## 2024-07-29 MED ORDER — SENNOSIDES-DOCUSATE SODIUM 8.6-50 MG PO TABS: 1.0000 | ORAL_TABLET | Freq: Every evening | ORAL | Status: DC | PRN

## 2024-07-29 MED ORDER — MENTHOL 3 MG MT LOZG: 1.0000 | LOZENGE | OROMUCOSAL | Status: DC | PRN

## 2024-07-29 MED ORDER — PROPOFOL 10 MG/ML IV BOLUS
INTRAVENOUS | Status: DC | PRN
  Administered 2024-07-29: 170 mg via INTRAVENOUS

## 2024-07-29 MED ORDER — SENNA 8.6 MG PO TABS
1.0000 | ORAL_TABLET | Freq: Two times a day (BID) | ORAL | Status: DC
  Administered 2024-07-29 – 2024-07-30 (×2): 8.6 mg via ORAL
  Filled 2024-07-29 (×2): qty 1

## 2024-07-29 MED ORDER — LIDOCAINE-EPINEPHRINE 0.5 %-1:200000 IJ SOLN
INTRAMUSCULAR | Status: AC
  Filled 2024-07-29: qty 50

## 2024-07-29 MED ORDER — OXYCODONE HCL 5 MG PO TABS: 10.0000 mg | ORAL_TABLET | ORAL | Status: DC | PRN

## 2024-07-29 MED ORDER — ONDANSETRON HCL 4 MG/2ML IJ SOLN
INTRAMUSCULAR | Status: AC
Start: 2024-07-29 — End: 2024-07-29
  Filled 2024-07-29: qty 2

## 2024-07-29 MED ORDER — ONDANSETRON HCL 4 MG/2ML IJ SOLN
INTRAMUSCULAR | Status: DC | PRN
  Administered 2024-07-29: 4 mg via INTRAVENOUS

## 2024-07-29 MED ORDER — FENOFIBRATE 160 MG PO TABS
160.0000 mg | ORAL_TABLET | Freq: Every day | ORAL | Status: DC
  Administered 2024-07-29: 160 mg via ORAL
  Filled 2024-07-29: qty 1

## 2024-07-29 MED ORDER — IRBESARTAN 150 MG PO TABS: 300.0000 mg | ORAL_TABLET | Freq: Every day | ORAL | Status: DC

## 2024-07-29 MED ORDER — CHLORHEXIDINE GLUCONATE 0.12 % MT SOLN
15.0000 mL | Freq: Once | OROMUCOSAL | Status: AC
  Administered 2024-07-29: 15 mL via OROMUCOSAL
  Filled 2024-07-29: qty 15

## 2024-07-29 MED ORDER — ROCURONIUM BROMIDE 10 MG/ML (PF) SYRINGE
PREFILLED_SYRINGE | INTRAVENOUS | Status: DC | PRN
  Administered 2024-07-29: 50 mg via INTRAVENOUS
  Administered 2024-07-29: 10 mg via INTRAVENOUS

## 2024-07-29 MED ORDER — ORAL CARE MOUTH RINSE: 15.0000 mL | Freq: Once | OROMUCOSAL | Status: AC

## 2024-07-29 MED ORDER — LIDOCAINE 2% (20 MG/ML) 5 ML SYRINGE
INTRAMUSCULAR | Status: AC
  Filled 2024-07-29: qty 5

## 2024-07-29 MED ORDER — POTASSIUM CHLORIDE IN NACL 20-0.9 MEQ/L-% IV SOLN
INTRAVENOUS | Status: DC
  Filled 2024-07-29 (×2): qty 1000

## 2024-07-29 MED ORDER — DEXAMETHASONE SODIUM PHOSPHATE 10 MG/ML IJ SOLN
INTRAMUSCULAR | Status: DC | PRN
  Administered 2024-07-29: 10 mg via INTRAVENOUS

## 2024-07-29 MED ORDER — CHLORHEXIDINE GLUCONATE CLOTH 2 % EX PADS: 6.0000 | MEDICATED_PAD | Freq: Once | CUTANEOUS | Status: DC

## 2024-07-29 MED ORDER — PRAVASTATIN SODIUM 40 MG PO TABS
80.0000 mg | ORAL_TABLET | Freq: Every day | ORAL | Status: DC
  Administered 2024-07-29: 80 mg via ORAL
  Filled 2024-07-29: qty 2

## 2024-07-29 MED ORDER — ACETAMINOPHEN 325 MG PO TABS
650.0000 mg | ORAL_TABLET | ORAL | Status: DC | PRN
Start: 2024-07-29 — End: 2024-07-30

## 2024-07-29 MED ORDER — 0.9 % SODIUM CHLORIDE (POUR BTL) OPTIME
TOPICAL | Status: DC | PRN
  Administered 2024-07-29: 1000 mL

## 2024-07-29 MED ORDER — FENTANYL CITRATE (PF) 100 MCG/2ML IJ SOLN
INTRAMUSCULAR | Status: AC
  Filled 2024-07-29: qty 2

## 2024-07-29 MED ORDER — OXYCODONE HCL 5 MG/5ML PO SOLN: 5.0000 mg | Freq: Once | ORAL | Status: AC | PRN

## 2024-07-29 MED ORDER — SODIUM CHLORIDE 0.9% FLUSH: 3.0000 mL | INTRAVENOUS | Status: DC | PRN

## 2024-07-29 MED ORDER — ONDANSETRON HCL 4 MG/2ML IJ SOLN: 4.0000 mg | Freq: Four times a day (QID) | INTRAMUSCULAR | Status: DC | PRN

## 2024-07-29 MED ORDER — PHENOL 1.4 % MT LIQD: 1.0000 | OROMUCOSAL | Status: DC | PRN

## 2024-07-29 MED ORDER — HYDROMORPHONE HCL 1 MG/ML IJ SOLN
INTRAMUSCULAR | Status: AC
Start: 2024-07-29 — End: 2024-07-29
  Filled 2024-07-29: qty 1

## 2024-07-29 MED ORDER — AMLODIPINE BESYLATE 10 MG PO TABS
10.0000 mg | ORAL_TABLET | Freq: Every morning | ORAL | Status: DC
  Administered 2024-07-30: 10 mg via ORAL
  Filled 2024-07-29: qty 1

## 2024-07-29 MED ORDER — HYDROMORPHONE HCL 1 MG/ML IJ SOLN
0.2500 mg | INTRAMUSCULAR | Status: DC | PRN
  Administered 2024-07-29 (×4): 0.5 mg via INTRAVENOUS

## 2024-07-29 MED ORDER — BUPIVACAINE HCL (PF) 0.5 % IJ SOLN
INTRAMUSCULAR | Status: AC
  Filled 2024-07-29: qty 30

## 2024-07-29 MED ORDER — INSULIN ASPART 100 UNIT/ML IJ SOLN: 0.0000 [IU] | INTRAMUSCULAR | Status: DC | PRN

## 2024-07-29 MED ORDER — LACTATED RINGERS IV SOLN: INTRAVENOUS | Status: DC

## 2024-07-29 MED ORDER — ASPIRIN 81 MG PO TBEC
81.0000 mg | DELAYED_RELEASE_TABLET | Freq: Every day | ORAL | Status: DC
  Administered 2024-07-29: 81 mg via ORAL
  Filled 2024-07-29: qty 1

## 2024-07-29 MED ORDER — HYDROCHLOROTHIAZIDE 25 MG PO TABS: 25.0000 mg | ORAL_TABLET | Freq: Every day | ORAL | Status: DC

## 2024-07-29 MED ORDER — THROMBIN 5000 UNITS EX KIT
PACK | CUTANEOUS | Status: AC
  Filled 2024-07-29: qty 2

## 2024-07-29 MED ORDER — FENTANYL CITRATE (PF) 250 MCG/5ML IJ SOLN
INTRAMUSCULAR | Status: AC
  Filled 2024-07-29: qty 5

## 2024-07-29 MED ORDER — BUPIVACAINE HCL (PF) 0.5 % IJ SOLN
INTRAMUSCULAR | Status: DC | PRN
  Administered 2024-07-29: 30 mL

## 2024-07-29 MED ORDER — DEXAMETHASONE SODIUM PHOSPHATE 10 MG/ML IJ SOLN
INTRAMUSCULAR | Status: AC
  Filled 2024-07-29: qty 1

## 2024-07-29 MED ORDER — ONDANSETRON HCL 4 MG PO TABS: 4.0000 mg | ORAL_TABLET | Freq: Four times a day (QID) | ORAL | Status: DC | PRN

## 2024-07-29 MED ORDER — PROPOFOL 10 MG/ML IV BOLUS
INTRAVENOUS | Status: AC
Start: 2024-07-29 — End: 2024-07-29
  Filled 2024-07-29: qty 20

## 2024-07-29 MED ORDER — OXYCODONE HCL 5 MG PO TABS
ORAL_TABLET | ORAL | Status: AC
  Filled 2024-07-29: qty 1

## 2024-07-29 MED ORDER — LIDOCAINE 2% (20 MG/ML) 5 ML SYRINGE
INTRAMUSCULAR | Status: DC | PRN
  Administered 2024-07-29: 100 mg via INTRAVENOUS

## 2024-07-29 MED ORDER — EZETIMIBE 10 MG PO TABS
10.0000 mg | ORAL_TABLET | Freq: Every morning | ORAL | Status: DC
  Administered 2024-07-30: 10 mg via ORAL
  Filled 2024-07-29: qty 1

## 2024-07-29 MED ORDER — BISACODYL 5 MG PO TBEC: 5.0000 mg | DELAYED_RELEASE_TABLET | Freq: Every day | ORAL | Status: DC | PRN

## 2024-07-29 MED ORDER — HYDROMORPHONE HCL 1 MG/ML IJ SOLN
INTRAMUSCULAR | Status: AC
  Filled 2024-07-29: qty 1

## 2024-07-29 MED ORDER — OXYCODONE HCL 5 MG PO TABS
5.0000 mg | ORAL_TABLET | Freq: Once | ORAL | Status: AC | PRN
  Administered 2024-07-29: 5 mg via ORAL

## 2024-07-29 MED ORDER — HYDROCODONE-ACETAMINOPHEN 7.5-325 MG PO TABS
1.0000 | ORAL_TABLET | ORAL | Status: DC | PRN
  Administered 2024-07-29 – 2024-07-30 (×5): 1 via ORAL
  Filled 2024-07-29 (×5): qty 1

## 2024-07-29 MED ORDER — DIAZEPAM 5 MG PO TABS: 5.0000 mg | ORAL_TABLET | Freq: Four times a day (QID) | ORAL | Status: DC | PRN

## 2024-07-29 MED ORDER — HYDROMORPHONE HCL 1 MG/ML IJ SOLN: 0.5000 mg | INTRAMUSCULAR | Status: DC | PRN

## 2024-07-29 MED ORDER — SUGAMMADEX SODIUM 200 MG/2ML IV SOLN
INTRAVENOUS | Status: DC | PRN
  Administered 2024-07-29: 200 mg via INTRAVENOUS

## 2024-07-29 MED ORDER — VITAMIN B-12 1000 MCG PO TABS
500.0000 ug | ORAL_TABLET | Freq: Every morning | ORAL | Status: DC
  Administered 2024-07-30: 500 ug via ORAL
  Filled 2024-07-29: qty 1

## 2024-07-29 MED ORDER — FENTANYL CITRATE (PF) 250 MCG/5ML IJ SOLN
INTRAMUSCULAR | Status: DC | PRN
  Administered 2024-07-29 (×3): 50 ug via INTRAVENOUS

## 2024-07-29 MED ORDER — FENTANYL CITRATE (PF) 100 MCG/2ML IJ SOLN
25.0000 ug | INTRAMUSCULAR | Status: DC | PRN
  Administered 2024-07-29 (×4): 50 ug via INTRAVENOUS

## 2024-07-29 MED ORDER — LIDOCAINE-EPINEPHRINE 0.5 %-1:200000 IJ SOLN
INTRAMUSCULAR | Status: DC | PRN
  Administered 2024-07-29: 8 mL

## 2024-07-29 MED ORDER — SODIUM CHLORIDE 0.9 % IV SOLN: 250.0000 mL | INTRAVENOUS | Status: DC

## 2024-07-29 MED ORDER — PHENYLEPHRINE HCL-NACL 20-0.9 MG/250ML-% IV SOLN
INTRAVENOUS | Status: DC | PRN
  Administered 2024-07-29: 20 ug/min via INTRAVENOUS

## 2024-07-29 MED ORDER — CHLORHEXIDINE GLUCONATE 4 % EX SOLN: 1.0000 | CUTANEOUS | 1 refills | Status: AC

## 2024-07-29 MED ORDER — THROMBIN (RECOMBINANT) 5000 UNITS EX SOLR
CUTANEOUS | Status: DC | PRN
  Administered 2024-07-29: 10 mL via TOPICAL

## 2024-07-29 MED ORDER — ONDANSETRON HCL 4 MG/2ML IJ SOLN: 4.0000 mg | Freq: Once | INTRAMUSCULAR | Status: DC | PRN

## 2024-07-29 MED ORDER — MUPIROCIN 2 % EX OINT: 1.0000 | TOPICAL_OINTMENT | Freq: Two times a day (BID) | CUTANEOUS | 0 refills | Status: AC

## 2024-07-29 MED ORDER — CO Q 10 100 MG PO CAPS: 200.0000 mg | ORAL_CAPSULE | Freq: Every day | ORAL | Status: DC

## 2024-07-29 MED ORDER — EPHEDRINE SULFATE-NACL 50-0.9 MG/10ML-% IV SOSY
PREFILLED_SYRINGE | INTRAVENOUS | Status: DC | PRN
  Administered 2024-07-29: 5 mg via INTRAVENOUS
  Administered 2024-07-29: 7.5 mg via INTRAVENOUS

## 2024-07-29 MED ORDER — VALSARTAN-HYDROCHLOROTHIAZIDE 320-25 MG PO TABS: 1.0000 | ORAL_TABLET | Freq: Every morning | ORAL | Status: DC

## 2024-07-29 MED ORDER — ACETAMINOPHEN 500 MG PO TABS: 1000.0000 mg | ORAL_TABLET | Freq: Once | ORAL | Status: DC

## 2024-07-29 SURGICAL SUPPLY — 44 items
BAG COUNTER SPONGE SURGICOUNT (BAG) ×1 IMPLANT
BENZOIN TINCTURE PRP APPL 2/3 (GAUZE/BANDAGES/DRESSINGS) IMPLANT
BLADE CLIPPER SURG (BLADE) IMPLANT
BUR MATCHSTICK NEURO 3.0 LAGG (BURR) ×1 IMPLANT
BUR ROUND FLUTED 5 RND (BURR) IMPLANT
CANISTER SUCTION 3000ML PPV (SUCTIONS) ×1 IMPLANT
DERMABOND ADVANCED .7 DNX12 (GAUZE/BANDAGES/DRESSINGS) ×1 IMPLANT
DRAPE LAPAROTOMY 100X72X124 (DRAPES) ×1 IMPLANT
DRAPE MICROSCOPE SLANT 54X150 (MISCELLANEOUS) IMPLANT
DRAPE SURG 17X23 STRL (DRAPES) ×1 IMPLANT
DRSG OPSITE POSTOP 4X8 (GAUZE/BANDAGES/DRESSINGS) IMPLANT
DURAPREP 26ML APPLICATOR (WOUND CARE) ×1 IMPLANT
ELECTRODE REM PT RTRN 9FT ADLT (ELECTROSURGICAL) ×1 IMPLANT
GAUZE 4X4 16PLY ~~LOC~~+RFID DBL (SPONGE) IMPLANT
GAUZE SPONGE 4X4 12PLY STRL (GAUZE/BANDAGES/DRESSINGS) IMPLANT
GLOVE BIOGEL PI IND STRL 7.5 (GLOVE) IMPLANT
GLOVE ECLIPSE 6.5 STRL STRAW (GLOVE) ×1 IMPLANT
GLOVE EXAM NITRILE XL STR (GLOVE) IMPLANT
GLOVE SURG SS PI 7.0 STRL IVOR (GLOVE) IMPLANT
GOWN STRL REUS W/ TWL LRG LVL3 (GOWN DISPOSABLE) ×1 IMPLANT
GOWN STRL REUS W/ TWL XL LVL3 (GOWN DISPOSABLE) IMPLANT
GOWN STRL REUS W/TWL 2XL LVL3 (GOWN DISPOSABLE) IMPLANT
GOWN STRL SURGICAL XL XLNG (GOWN DISPOSABLE) IMPLANT
KIT BASIN OR (CUSTOM PROCEDURE TRAY) ×1 IMPLANT
KIT INFUSE SMALL (Orthopedic Implant) IMPLANT
KIT POSITIONER JACKSON TABLE (MISCELLANEOUS) ×1 IMPLANT
KIT TURNOVER KIT B (KITS) ×1 IMPLANT
NDL HYPO 25X1 1.5 SAFETY (NEEDLE) ×1 IMPLANT
NDL SPNL 18GX3.5 QUINCKE PK (NEEDLE) IMPLANT
NEEDLE HYPO 25X1 1.5 SAFETY (NEEDLE) ×1 IMPLANT
NEEDLE SPNL 18GX3.5 QUINCKE PK (NEEDLE) IMPLANT
NS IRRIG 1000ML POUR BTL (IV SOLUTION) ×1 IMPLANT
PACK LAMINECTOMY NEURO (CUSTOM PROCEDURE TRAY) ×1 IMPLANT
PAD ARMBOARD POSITIONER FOAM (MISCELLANEOUS) ×3 IMPLANT
SPIKE FLUID TRANSFER (MISCELLANEOUS) ×1 IMPLANT
SPONGE SURGIFOAM ABS GEL SZ50 (HEMOSTASIS) ×1 IMPLANT
SPONGE T-LAP 4X18 ~~LOC~~+RFID (SPONGE) IMPLANT
STRIP CLOSURE SKIN 1/2X4 (GAUZE/BANDAGES/DRESSINGS) IMPLANT
SUT VIC AB 0 CT1 18XCR BRD8 (SUTURE) ×1 IMPLANT
SUT VIC AB 2-0 CT1 18 (SUTURE) ×1 IMPLANT
SUT VIC AB 3-0 SH 8-18 (SUTURE) ×1 IMPLANT
TOWEL GREEN STERILE (TOWEL DISPOSABLE) ×1 IMPLANT
TOWEL GREEN STERILE FF (TOWEL DISPOSABLE) ×1 IMPLANT
WATER STERILE IRR 1000ML POUR (IV SOLUTION) ×1 IMPLANT

## 2024-07-29 NOTE — Transfer of Care (Signed)
 Immediate Anesthesia Transfer of Care Note  Patient: Keith Oconnell  Procedure(s) Performed: REMOVAL, HARDWARE LUMBAR  Patient Location: PACU  Anesthesia Type:General  Level of Consciousness: awake, alert , and patient cooperative  Airway & Oxygen Therapy: Patient Spontanous Breathing and Patient connected to face mask oxygen  Post-op Assessment: Report given to RN and Post -op Vital signs reviewed and stable  Post vital signs: Reviewed and stable  Last Vitals:  Vitals Value Taken Time  BP 137/57 07/29/24 13:57  Temp    Pulse 79 07/29/24 13:58  Resp 15 07/29/24 13:58  SpO2 97 % 07/29/24 13:58  Vitals shown include unfiled device data.  Last Pain:  Vitals:   07/29/24 0845  TempSrc:   PainSc: 7       Patients Stated Pain Goal: 4 (07/29/24 0845)  Complications: There were no known notable events for this encounter.

## 2024-07-29 NOTE — Anesthesia Postprocedure Evaluation (Signed)
 Anesthesia Post Note  Patient: Keith Oconnell  Procedure(s) Performed: REMOVAL, HARDWARE LUMBAR     Patient location during evaluation: PACU Anesthesia Type: General Level of consciousness: awake and alert Pain management: pain level controlled Vital Signs Assessment: post-procedure vital signs reviewed and stable Respiratory status: spontaneous breathing, nonlabored ventilation, respiratory function stable and patient connected to nasal cannula oxygen Cardiovascular status: blood pressure returned to baseline and stable Postop Assessment: no apparent nausea or vomiting Anesthetic complications: no   There were no known notable events for this encounter.  Last Vitals:  Vitals:   07/29/24 0830 07/29/24 1356  BP: (!) 146/61 (!) 137/57  Pulse: (!) 43 79  Resp: 18 14  Temp: 36.6 C 36.5 C  SpO2: 97% 99%    Last Pain:  Vitals:   07/29/24 1356  TempSrc:   PainSc: 5                  Rome Ade

## 2024-07-29 NOTE — Anesthesia Procedure Notes (Addendum)
 Procedure Name: Intubation Date/Time: 07/29/2024 11:47 AM  Performed by: Kearney Rosina SAILOR, RNPre-anesthesia Checklist: Patient identified, Emergency Drugs available, Suction available and Patient being monitored Patient Re-evaluated:Patient Re-evaluated prior to induction Oxygen Delivery Method: Circle System Utilized Preoxygenation: Pre-oxygenation with 100% oxygen Induction Type: IV induction Ventilation: Mask ventilation without difficulty and Oral airway inserted - appropriate to patient size Laryngoscope Size: Mac and 4 Grade View: Grade I Tube type: Oral Tube size: 7.0 mm Number of attempts: 1 Airway Equipment and Method: Stylet, Oral airway and Bite block Placement Confirmation: ETT inserted through vocal cords under direct vision, positive ETCO2 and breath sounds checked- equal and bilateral Secured at: 23 cm Tube secured with: Tape Dental Injury: Teeth and Oropharynx as per pre-operative assessment  Comments: Atraumatic

## 2024-07-29 NOTE — Interval H&P Note (Signed)
 History and Physical Interval Note:  07/29/2024 11:27 AM  Keith Oconnell  has presented today for surgery, with the diagnosis of Painful orthopaedic hardware.  The various methods of treatment have been discussed with the patient and family. After consideration of risks, benefits and other options for treatment, the patient has consented to  Procedure(s) with comments: REMOVAL, HARDWARE (N/A) - Hardware removal as a surgical intervention.  The patient's history has been reviewed, patient examined, no change in status, stable for surgery.  I have reviewed the patient's chart and labs.  Questions were answered to the patient's satisfaction.     [name]

## 2024-07-29 NOTE — H&P (Signed)
 Allergies  Allergen Reactions   Morphine  And Codeine Nausea And Vomiting and Nausea Only   Tizanidine      Makes mouth dry   Tramadol     Kept him up all night and drove him crazy   Past Medical History:  Diagnosis Date   Arthritis    Diabetes mellitus without complication (HCC)    Pt has been diet controlled for 5+ years   Dyspnea    Elevated coronary artery calcium score 12/24/2023   GERD (gastroesophageal reflux disease)    Heart murmur    never given him any problems   History of blood transfusion    History of elevated glucose    per pt- pre diabetic   History of kidney stones    Hyperlipidemia    Hypertension    Pneumonia    Psoriasis   ' Past Surgical History:  Procedure Laterality Date   BACK SURGERY  12/01/1993   lumbar   COLONOSCOPY     CYSTOSCOPY WITH RETROGRADE PYELOGRAM, URETEROSCOPY AND STENT PLACEMENT Left 08/14/2023   Procedure: CYSTOSCOPY WITH LEFT RETROGRADE PYELOGRAM, URETEROSCOPY HOLMIUM LASER AND STENT PLACEMENT;  Surgeon: Carolee Sherwood JONETTA DOUGLAS, MD;  [facility];  Service: Urology;  Laterality: Left;  60 MINS FOR CASE   DENTAL SURGERY     pins placed upper front tooth   EXTRACORPOREAL SHOCK WAVE LITHOTRIPSY Left 06/29/2023   Procedure: LEFT EXTRACORPOREAL SHOCK WAVE LITHOTRIPSY (ESWL);  Surgeon: Carolee Sherwood JONETTA DOUGLAS, MD;  Location: [facility];  Service: Urology;  Laterality: Left;   LUMBAR LAMINECTOMY/DECOMPRESSION MICRODISCECTOMY N/A 11/07/2020   Procedure: Lumbar 4-5 Laminectomy/Foraminotomy;  Surgeon: Gillie Duncans, MD;  [facility];  Service: Neurosurgery;  Laterality: N/A;  3C/RM 21   ORIF HUMERUS FRACTURE Left 07/11/2023   Procedure: OPEN REDUCTION INTERNAL FIXATION (ORIF) HUMERAL SHAFT FRACTURE;  Surgeon: Kay Kemps, MD;  [facility];  Service: Orthopedics;  Laterality: Left;   POSTERIOR LUMBAR FUSION  03/24/2024   L3-L4 with Dr. Gillie   REVERSE SHOULDER ARTHROPLASTY Left 03/17/2014   Procedure: LEFT REVERSE  SHOULDER ARTHROPLASTY;  Surgeon: Elspeth JONELLE Kay, MD;  Location: [facility] OR;  Service: Orthopedics;  Laterality: Left;   SHOULDER OPEN ROTATOR CUFF REPAIR  10/07/2011   Procedure: ROTATOR CUFF REPAIR SHOULDER OPEN;  Surgeon: Tanda DELENA Heading;  [facility];  Service: Orthopedics;  Laterality: Right;  Open Rotator Cuff Repair/Acrominectomy with Graft and Anchors   SHOULDER OPEN ROTATOR CUFF REPAIR Left 06/08/2013   Procedure: OPEN LEFT SHOULDER ROTATOR CUFF REPAIR , complex repair with 3 anchors and graft;  Surgeon: Tanda DELENA Heading, MD;  [facility];  Service: Orthopedics;  Laterality: Left;   WRIST ARTHROSCOPY Left 10/11/2013   Procedure: LEFT ARTHROSCOPY WRIST DEBRIDMENT/SHRINKAGE;  Surgeon: Arley JONELLE Curia, MD;  Location: [facility];  Service: Orthopedics;  Laterality: Left;   WRIST SURGERY Right    repair fractured wrist   No family history on file. Social History   Socioeconomic History   Marital status: Married    Spouse name: Not on file   Number of children: Not on file   Years of education: Not on file   Highest education level: Not on file  Occupational History   Not on file  Tobacco Use   Smoking status: Never   Smokeless tobacco: Never  Vaping Use   Vaping status: Never Used  Substance and Sexual Activity   Alcohol use: No   Drug use: No   Sexual activity: Yes  Other  Topics Concern   Not on file  Social History Narrative   Not on file   Social Drivers of Health   Financial Resource Strain: Low Risk  (02/24/2024)   Received from [facility]   Overall Financial Resource Strain (CARDIA)    Difficulty of Paying Living Expenses: Not hard at all  Food Insecurity: No Food Insecurity (03/24/2024)   Hunger Vital Sign    Worried About Running Out of Food in the Last Year: Never true    Ran Out of Food in the Last Year: Never true  Transportation Needs: No Transportation Needs (03/24/2024)   PRAPARE - Administrator, Civil Service (Medical):  No    Lack of Transportation (Non-Medical): No  Physical Activity: Unknown (02/24/2024)   Received from [facility]   Exercise Vital Sign    On average, how many days per week do you engage in moderate to strenuous exercise (like a brisk walk)?: 0 days    Minutes of Exercise per Session: Not on file  Stress: Stress Concern Present (02/24/2024)   Received from [facility] - Occupational Stress Questionnaire    Feeling of Stress : To some extent  Social Connections: Moderately Integrated (03/24/2024)   Social Connection and Isolation Panel    Frequency of Communication with Friends and Family: More than three times a week    Frequency of Social Gatherings with Friends and Family: More than three times a week    Attends Religious Services: More than 4 times per year    Active Member of [employer] or Organizations: No    Attends Banker Meetings: Patient declined    Marital Status: Married  Catering manager Violence: Not At Risk (05/17/2024)   Received from [facility]   HITS    Over the last 12 months how often did your partner physically hurt you?: Never    Over the last 12 months how often did your partner insult you or talk down to you?: Never    Over the last 12 months how often did your partner threaten you with physical harm?: Never    Over the last 12 months how often did your partner scream or curse at you?: Never   Mr. Tata returns after the discogram.  Both levels with discordant 2-3 and at 5-1.  At this point, I think he has solid arthrodesis, certainly well on his way.  He has loosening of the top pedicle screws, so I would just go ahead and take out all of his hardware which at this point, I don't think he needs.  He also had exquisite pain with external rotation of the left hip.  I am just going to remove the hardware.  We will get that scheduled and give him a call.  He is alert and oriented by 4.  He answers all questions  appropriately.  Memory, language, attention span, and fund of knowledge are normal.  He still states he can only sleep in a recliner.  We will see what the hip injection gets us .  But, I will take out the hardware regardless. 5/5 strength lower extremities. Normal muscle tone and bulk, gait is antalgic. Pupils equal round and reactive to light. Hearing intact to voice, tongue and uvula midline.

## 2024-07-30 MED ORDER — METHOCARBAMOL 500 MG PO TABS: 500.0000 mg | ORAL_TABLET | Freq: Three times a day (TID) | ORAL | 1 refills | Status: AC | PRN

## 2024-07-30 MED ORDER — MUPIROCIN 2 % EX OINT
TOPICAL_OINTMENT | Freq: Two times a day (BID) | CUTANEOUS | Status: DC
  Filled 2024-07-30: qty 22

## 2024-07-30 MED ORDER — CEPHALEXIN 500 MG PO CAPS: 500.0000 mg | ORAL_CAPSULE | Freq: Four times a day (QID) | ORAL | 0 refills | Status: AC

## 2024-07-30 MED ORDER — HYDROCODONE-ACETAMINOPHEN 7.5-325 MG PO TABS
1.0000 | ORAL_TABLET | ORAL | 0 refills | Status: AC | PRN
Start: 2024-07-30 — End: ?

## 2024-07-30 NOTE — Discharge Summary (Signed)
 Physician Discharge Summary     Providing Compassionate, Quality Care - Together   Patient ID: Keith Oconnell MRN: [id number] DOB/AGE: [birth year] 73 y.o.  Admit date: 07/29/2024 Discharge date: 07/30/2024  Admission Diagnoses: Painful orthopedic hardware  Discharge Diagnoses:  Principal Problem:   Painful orthopaedic hardware [facility])   Discharged Condition: good  Hospital Course: Patient underwent removal of hardware by Dr. Gillie on 07/29/2024. He was admitted to 3C04 following recovery from anesthesia in the PACU. His postoperative course has been uncomplicated. He has worked with occupational therapy who feels the patient is ready for discharge home. He is ambulating independently with the aid of a walker. He is tolerating a normal diet. He is not having any bowel or bladder dysfunction. His pain is well-controlled with oral pain medication. He is ready for discharge home.   Consults: OT/TOC  Treatments: surgery: Removal of lumbar hardware  Discharge Exam: Blood pressure (!) 137/51, pulse (!) 53, temperature 97.7 F (36.5 C), temperature source Oral, resp. rate 20, height 6' (1.829 m), weight 89.8 kg, SpO2 100%.  Alert and oriented x 4 PERRLA CN II-XII grossly intact MAE, Strength and sensation intact Incision is covered with gauze dressing; Dressing with moderate amount of sanguinous drainage   Disposition: Discharge disposition: 01-Home or Self Care       Discharge Instructions     Change dressing (specify)   Complete by: As directed    Dressing change: As needed to keep incision dry.   Diet - low sodium heart healthy   Complete by: As directed    Increase activity slowly   Complete by: As directed       Allergies as of 07/30/2024       Reactions   Morphine  And Codeine Nausea And Vomiting, Nausea Only   Tizanidine     Makes mouth dry   Tramadol    Kept him up all night and drove him crazy        Medication List     PAUSE taking these  medications    HYDROcodone -acetaminophen  7.5-325 MG tablet Wait to take this until: August 04, 2024 Commonly known as: NORCO Take 1 tablet by mouth every 6 (six) hours as needed (pain.). You also have another medication with the same name that you may need to continue taking.       TAKE these medications    amLODipine  10 MG tablet Commonly known as: NORVASC  Take 10 mg by mouth in the morning.   aspirin  EC 81 MG tablet Take 81 mg by mouth at bedtime. Swallow whole.   cephALEXin  500 MG capsule Commonly known as: KEFLEX  Take 1 capsule (500 mg total) by mouth 4 (four) times daily.   chlorhexidine  4 % external liquid Commonly known as: HIBICLENS  Apply 15 mLs (1 Application total) topically as directed for 30 doses. Use as directed daily for 5 days every other week for 6 weeks.   CO Q 10 PO Take 200 mg by mouth daily.   cyanocobalamin  500 MCG tablet Commonly known as: VITAMIN B12 Take 500 mcg by mouth in the morning.   ezetimibe  10 MG tablet Commonly known as: ZETIA  Take 10 mg by mouth in the morning.   fenofibrate  145 MG tablet Commonly known as: TRICOR  Take 145 mg by mouth at bedtime.   gabapentin  100 MG capsule Commonly known as: NEURONTIN  Take 300-400 mg by mouth at bedtime.   Garlique 400 MG Tbec Generic drug: Garlic  Take 400 mg by mouth daily.   HYDROcodone -acetaminophen  7.5-325 MG tablet  Commonly known as: NORCO Take 1 tablet by mouth every 4 (four) hours as needed for severe pain (pain score 7-10) (Postoperative pain). What changed: Another medication with the same name was paused. Ask your nurse or doctor if you should take this medication.   methocarbamol  500 MG tablet Commonly known as: ROBAXIN  Take 1 tablet (500 mg total) by mouth every 8 (eight) hours as needed for muscle spasms. What changed: when to take this   mupirocin  ointment 2 % Commonly known as: BACTROBAN  Place 1 Application into the nose 2 (two) times daily for 60 doses. Use as  directed 2 times daily for 5 days every other week for 6 weeks.   OMEGA 3 PO Take 1,400 mg by mouth in the morning and at bedtime.   ONE TOUCH ULTRA 2 w/Device Kit   ONE TOUCH ULTRA TEST test strip Generic drug: glucose blood   pravastatin  80 MG tablet Commonly known as: PRAVACHOL  Take 80 mg by mouth at bedtime.   Ultravate  0.05 % Lotn Generic drug: Halobetasol  Propionate Apply 1 Application topically daily as needed (psoriasis).   valsartan -hydrochlorothiazide  320-25 MG tablet Commonly known as: DIOVAN -HCT Take 1 tablet by mouth in the morning.               Discharge Care Instructions  (From admission, onward)           Start     Ordered   07/30/24 0000  Change dressing (specify)       Comments: Dressing change: As needed to keep incision dry.   07/30/24 1211            Follow-up Information     Gillie Duncans, MD. Go on 08/11/2024.   Specialty: Neurosurgery Why: First post op appointment is on 08/11/2024 at 10:15 AM. Contact information: [address] [place] [address] [phone]                 Signed: Gerard Beck, DNP, AGNP-C Nurse Practitioner  [facility] [address], [address], [place], [place] [address] P: [phone]    F: [phone]  07/30/2024, 12:12 PM

## 2024-07-30 NOTE — Discharge Instructions (Signed)
 Wound Care Keep incision covered and dry if bleeding. Change dressing as needed to keep incision dry. Do not put any creams, lotions, or ointments on incision. You are fine to shower. Let water  run over incision and pat dry.  Activity Walk each and every day, increasing distance each day. No lifting greater than 5 lbs.  Avoid excessive back motion. No driving for 2 weeks; may ride as a passenger locally.  Diet Resume your normal diet.   Return to Work Will be discussed at your follow up appointment.  Call Your Doctor If Any of These Occur Redness, drainage, or swelling at the wound.  Temperature greater than 101 degrees. Severe pain not relieved by pain medication. Incision starts to come apart.  Follow Up Appt Call [phone] today for appointment in 2-3 weeks if you don't already have one or for any problems.
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
## 2024-07-30 NOTE — Progress Notes (Signed)
 Patient alert and oriented, ambulate, voided. Surgical site clean and dry no sign of infection. D/c instructions explain and given to the patient, all questions answered to the patient's satisfaction.

## 2024-07-30 NOTE — Progress Notes (Signed)
 PT Note  Patient Details Name: Keith Oconnell MRN: [id number] DOB: 1951-10-25  Spoke with occupational therapy after their initial evaluation. OT reports patient is functioning at a high level of independence and no physical therapy is indicated at this time. PT is signing-off. Please re-order if there is any significant change in status. Thank you for this referral.  Leontine Roads, PT, DPT [facility]  Rehabilitation Services Physical Therapist Office: [phone] Website: [email or web address]   Leontine GORMAN Roads 07/30/2024, 8:13 AM

## 2024-07-30 NOTE — Care Management (Signed)
 Patient with order to DC to home today. Unit staff to provide DME needed for home.   No HH needs identified Patient will have family/ friends provide transportation home. No other TOC needs identified for DC

## 2024-08-02 ENCOUNTER — Encounter (HOSPITAL_COMMUNITY): Payer: Self-pay | Admitting: Neurosurgery

## 2024-08-23 NOTE — Op Note (Signed)
 07/29/2024  10:23 AM  PATIENT:  Keith Oconnell  73 y.o. male With continued low back pain and what appears to be loose. He has a solid arthrodesis. I recommended we remove the hardware and he has agreed.  PRE-OPERATIVE DIAGNOSIS:  Painful orthopaedic hardware  POST-OPERATIVE DIAGNOSIS:  Painful orthopaedic hardware  PROCEDURE:  Procedure(s): REMOVAL, HARDWARE LUMBAR  SURGEON: Surgeon(s): Gillie Duncans, MD  ASSISTANTS:none  ANESTHESIA:   general  EBL:  No intake/output data recorded.  BLOOD ADMINISTERED:none  CELL SAVER GIVEN:not used  COUNT:per nursing  DRAINS: none   SPECIMEN:  No Specimen  DICTATION: Keith Oconnell was taken to the operating room, intubated, and placed under a general anesthetic without difficulty. He was positioned prone on a Dierks frame with all pressure points properly padded. His lumbar region was prepped and draped in a sterile manner.  I infiltrated the existing scar with lidocaine . I opened the incision with a 10 blade. I used monopolar cautery to dissect through the scar and fascial planes. I exposed the pedicle screws, and rods bilaterally. I removed the locking caps, the rods, and finally the screws. I irrigated the wound copiously. I closed the incision with vicryl sutures. I used dermabond and an occlusive bandage for sterile dressings.  PLAN OF CARE: Admit for overnight observation  PATIENT DISPOSITION:  PACU - hemodynamically stable.   Delay start of Pharmacological VTE agent (>24hrs) due to surgical blood loss or risk of bleeding:  no
